# Patient Record
Sex: Female | Born: 1935 | Race: White | Hispanic: No | Smoking: Never smoker
Health system: Southern US, Community
[De-identification: ages and names within clinical notes are randomized; demographics above are authoritative.]

## PROBLEM LIST (undated history)

## (undated) DIAGNOSIS — M1990 Unspecified osteoarthritis, unspecified site: Secondary | ICD-10-CM

## (undated) DIAGNOSIS — T8859XA Other complications of anesthesia, initial encounter: Secondary | ICD-10-CM

## (undated) DIAGNOSIS — I872 Venous insufficiency (chronic) (peripheral): Secondary | ICD-10-CM

## (undated) DIAGNOSIS — N200 Calculus of kidney: Secondary | ICD-10-CM

## (undated) DIAGNOSIS — T7840XA Allergy, unspecified, initial encounter: Secondary | ICD-10-CM

## (undated) DIAGNOSIS — K219 Gastro-esophageal reflux disease without esophagitis: Secondary | ICD-10-CM

## (undated) DIAGNOSIS — J45909 Unspecified asthma, uncomplicated: Secondary | ICD-10-CM

## (undated) DIAGNOSIS — D649 Anemia, unspecified: Secondary | ICD-10-CM

## (undated) DIAGNOSIS — N3941 Urge incontinence: Secondary | ICD-10-CM

## (undated) DIAGNOSIS — K529 Noninfective gastroenteritis and colitis, unspecified: Secondary | ICD-10-CM

## (undated) DIAGNOSIS — Z8679 Personal history of other diseases of the circulatory system: Secondary | ICD-10-CM

## (undated) DIAGNOSIS — Z87442 Personal history of urinary calculi: Secondary | ICD-10-CM

## (undated) HISTORY — PX: APPENDECTOMY: SHX54

## (undated) HISTORY — DX: Allergy, unspecified, initial encounter: T78.40XA

## (undated) HISTORY — DX: Urge incontinence: N39.41

## (undated) HISTORY — DX: Personal history of other diseases of the circulatory system: Z86.79

## (undated) HISTORY — DX: Noninfective gastroenteritis and colitis, unspecified: K52.9

## (undated) HISTORY — DX: Unspecified asthma, uncomplicated: J45.909

## (undated) HISTORY — PX: COSMETIC SURGERY: SHX468

## (undated) HISTORY — DX: Gastro-esophageal reflux disease without esophagitis: K21.9

## (undated) HISTORY — DX: Anemia, unspecified: D64.9

## (undated) HISTORY — DX: Unspecified osteoarthritis, unspecified site: M19.90

---

## 1966-03-23 HISTORY — PX: TUBAL LIGATION: SHX77

## 1969-03-23 HISTORY — PX: AUGMENTATION MAMMAPLASTY: SUR837

## 1984-03-23 DIAGNOSIS — K529 Noninfective gastroenteritis and colitis, unspecified: Secondary | ICD-10-CM

## 1984-03-23 HISTORY — PX: CHOLECYSTECTOMY: SHX55

## 1984-03-23 HISTORY — DX: Noninfective gastroenteritis and colitis, unspecified: K52.9

## 1998-03-23 DIAGNOSIS — N200 Calculus of kidney: Secondary | ICD-10-CM

## 1998-03-23 HISTORY — DX: Calculus of kidney: N20.0

## 1998-08-06 ENCOUNTER — Emergency Department (HOSPITAL_COMMUNITY): Admission: EM | Admit: 1998-08-06 | Discharge: 1998-08-06 | Payer: Self-pay | Admitting: Emergency Medicine

## 1998-08-06 ENCOUNTER — Encounter: Payer: Self-pay | Admitting: Emergency Medicine

## 1998-11-15 ENCOUNTER — Emergency Department (HOSPITAL_COMMUNITY): Admission: EM | Admit: 1998-11-15 | Discharge: 1998-11-15 | Payer: Self-pay | Admitting: Emergency Medicine

## 1998-12-12 ENCOUNTER — Other Ambulatory Visit: Admission: RE | Admit: 1998-12-12 | Discharge: 1998-12-12 | Payer: Self-pay | Admitting: Obstetrics & Gynecology

## 1998-12-26 ENCOUNTER — Emergency Department (HOSPITAL_COMMUNITY): Admission: EM | Admit: 1998-12-26 | Discharge: 1998-12-27 | Payer: Self-pay | Admitting: Emergency Medicine

## 1998-12-31 ENCOUNTER — Ambulatory Visit (HOSPITAL_COMMUNITY): Admission: RE | Admit: 1998-12-31 | Discharge: 1998-12-31 | Payer: Self-pay | Admitting: Urology

## 1998-12-31 ENCOUNTER — Encounter: Payer: Self-pay | Admitting: Urology

## 1999-07-30 ENCOUNTER — Other Ambulatory Visit: Admission: RE | Admit: 1999-07-30 | Discharge: 1999-07-30 | Payer: Self-pay | Admitting: Obstetrics & Gynecology

## 2000-02-10 ENCOUNTER — Other Ambulatory Visit: Admission: RE | Admit: 2000-02-10 | Discharge: 2000-02-10 | Payer: Self-pay | Admitting: Obstetrics & Gynecology

## 2000-07-27 ENCOUNTER — Encounter: Payer: Self-pay | Admitting: Family Medicine

## 2000-07-27 ENCOUNTER — Encounter: Admission: RE | Admit: 2000-07-27 | Discharge: 2000-07-27 | Payer: Self-pay | Admitting: Family Medicine

## 2000-07-30 ENCOUNTER — Encounter: Payer: Self-pay | Admitting: Urology

## 2000-08-02 ENCOUNTER — Ambulatory Visit (HOSPITAL_COMMUNITY): Admission: RE | Admit: 2000-08-02 | Discharge: 2000-08-02 | Payer: Self-pay | Admitting: Urology

## 2001-02-11 ENCOUNTER — Other Ambulatory Visit: Admission: RE | Admit: 2001-02-11 | Discharge: 2001-02-11 | Payer: Self-pay | Admitting: Obstetrics & Gynecology

## 2001-02-22 ENCOUNTER — Encounter: Payer: Self-pay | Admitting: Urology

## 2001-02-22 ENCOUNTER — Encounter: Admission: RE | Admit: 2001-02-22 | Discharge: 2001-02-22 | Payer: Self-pay | Admitting: Urology

## 2003-03-24 HISTORY — PX: ABLATION: SHX5711

## 2003-11-21 ENCOUNTER — Encounter: Admission: RE | Admit: 2003-11-21 | Discharge: 2003-11-22 | Payer: Self-pay | Admitting: Family Medicine

## 2004-03-23 HISTORY — PX: LITHOTRIPSY: SUR834

## 2004-05-30 ENCOUNTER — Ambulatory Visit: Payer: Self-pay | Admitting: Internal Medicine

## 2004-12-25 ENCOUNTER — Ambulatory Visit (HOSPITAL_COMMUNITY): Admission: RE | Admit: 2004-12-25 | Discharge: 2004-12-25 | Payer: Self-pay | Admitting: Urology

## 2005-10-29 ENCOUNTER — Ambulatory Visit (HOSPITAL_COMMUNITY): Admission: RE | Admit: 2005-10-29 | Discharge: 2005-10-29 | Payer: Self-pay | Admitting: Urology

## 2007-03-05 ENCOUNTER — Emergency Department (HOSPITAL_COMMUNITY): Admission: EM | Admit: 2007-03-05 | Discharge: 2007-03-05 | Payer: Self-pay | Admitting: *Deleted

## 2007-10-17 ENCOUNTER — Telehealth: Payer: Self-pay | Admitting: Internal Medicine

## 2007-10-20 ENCOUNTER — Ambulatory Visit: Payer: Self-pay | Admitting: Internal Medicine

## 2007-10-20 DIAGNOSIS — Z87442 Personal history of urinary calculi: Secondary | ICD-10-CM

## 2007-10-20 DIAGNOSIS — R1319 Other dysphagia: Secondary | ICD-10-CM

## 2007-10-20 DIAGNOSIS — K589 Irritable bowel syndrome without diarrhea: Secondary | ICD-10-CM

## 2007-10-20 DIAGNOSIS — K580 Irritable bowel syndrome with diarrhea: Secondary | ICD-10-CM | POA: Insufficient documentation

## 2007-10-20 DIAGNOSIS — K429 Umbilical hernia without obstruction or gangrene: Secondary | ICD-10-CM | POA: Insufficient documentation

## 2007-10-20 DIAGNOSIS — M129 Arthropathy, unspecified: Secondary | ICD-10-CM | POA: Insufficient documentation

## 2007-10-20 DIAGNOSIS — N281 Cyst of kidney, acquired: Secondary | ICD-10-CM

## 2007-10-20 DIAGNOSIS — R1310 Dysphagia, unspecified: Secondary | ICD-10-CM | POA: Insufficient documentation

## 2007-10-20 DIAGNOSIS — R197 Diarrhea, unspecified: Secondary | ICD-10-CM

## 2007-10-20 DIAGNOSIS — Z862 Personal history of diseases of the blood and blood-forming organs and certain disorders involving the immune mechanism: Secondary | ICD-10-CM

## 2010-08-08 NOTE — Op Note (Signed)
[facility]  Patient:    Susan Boone, Susan Boone                    MRN: [id number] Proc. Date: 08/02/00 Adm. Date:  [id number] Attending:  Laqueta Jean                           Operative Report  PREOPERATIVE DIAGNOSIS.  Left lower ureteral stone with flank pain and hematuria.  POSTOPERATIVE DIAGNOSIS:  Left lower ureteral stone with flank pain and hematuria.  OPERATION:  Cystourethroscopy, bilateral retrograde pyelogram, left ureteral ureteroscopy, holmium laser fractionization of ureteral calculus, basket extraction of ureteral stone, and left double-J catheter.  SURGEON:  Sigmund I. Patsi Sears, M.D.  ANESTHESIA:  General (LMA).  PREPARATION:  After appropriate preanesthesia, the patient was brought to the operating room, placed on the operating table in the dorsal supine position where general LMA anesthesia was introduced.  She was then re-placed in the low Allen stirrups, dorsal lithotomy position, where the pubis was prepped with Betadine solution and draped in the usual fashion.  REVIEW OF HISTORY:  This 76 year old female has a history of microhematuria, flank pain, with CT scan showing a lower third ureteral stone, measuring 1 cm. Because of the patients increasing pain, she was covered with oral pain medication until surgical intervention could be arranged for nonprogression of her stone.  DESCRIPTION OF PROCEDURE:  Retrograde pyelogram revealed the stone in the lower third ureter and on x-ray, the stone was partially hidden by the pelvis.  Retrograde pyelogram identified the stone, and ureteroscopy was accomplished after a Glidewire was passed around the stone into the renal pelvis.  Through the ureteroscope, it was obvious that the stone was too large to bring through the ureter.  The stone apparently had grown into the ureter, at the lateral posterior portion and could not be moved.  There was scar or  stricture circumferentially distal to the stone.  Using the holmium laser, the stone was fractured into multiple small portions, and these portions were basket-extracted.  Following this, repeat ureteroscopy and retrograde pyelograms performed which showed no evidence of extravasation. A definite area at the base of the stone could be identified, which had grown into the wall.  Because of a large amount of manipulation within the ureter, it was elected to leave a double-J catheter, and a 6 mm x 24 cm double-J catheter was selected and placed.  Repeat retrograde pyelogram was performed around the double-J catheter to be sure that it was within the renal pelvis, which was intrarenal and very narrow.  The patient tolerated the procedure well and was given a B&O suppository, IV Toradol, and antiemetic (Zofran), awakened, and taken to the recovery room in good condition. DD:  08/02/00 TD:  08/02/00 Job: [id number]

## 2010-12-29 LAB — BASIC METABOLIC PANEL
Chloride: 106
Potassium: 4

## 2010-12-29 LAB — URINALYSIS, ROUTINE W REFLEX MICROSCOPIC
Bilirubin Urine: NEGATIVE
Ketones, ur: NEGATIVE
Nitrite: NEGATIVE
Protein, ur: 100 — AB
Urobilinogen, UA: 0.2
pH: 5

## 2010-12-29 LAB — URINE MICROSCOPIC-ADD ON

## 2010-12-29 LAB — DIFFERENTIAL
Basophils Absolute: 0
Eosinophils Absolute: 0.1 — ABNORMAL LOW
Lymphocytes Relative: 17
Monocytes Absolute: 0.3
Neutro Abs: 6.6
Neutrophils Relative %: 78 — ABNORMAL HIGH

## 2010-12-29 LAB — CBC
HCT: 42.2
MCV: 84.7
RBC: 4.99
RDW: 14.5
WBC: 8.4

## 2011-02-20 ENCOUNTER — Other Ambulatory Visit: Payer: Self-pay | Admitting: Internal Medicine

## 2011-02-20 DIAGNOSIS — N200 Calculus of kidney: Secondary | ICD-10-CM

## 2011-02-23 ENCOUNTER — Other Ambulatory Visit: Payer: Self-pay

## 2011-02-25 ENCOUNTER — Ambulatory Visit
Admission: RE | Admit: 2011-02-25 | Discharge: 2011-02-25 | Disposition: A | Discharge status: Home / Self Care | Payer: Medicare Other | Source: Ambulatory Visit | Attending: Internal Medicine | Admitting: Internal Medicine

## 2011-02-25 DIAGNOSIS — N200 Calculus of kidney: Secondary | ICD-10-CM

## 2011-05-02 ENCOUNTER — Encounter (HOSPITAL_COMMUNITY): Payer: Self-pay | Admitting: *Deleted

## 2011-05-02 ENCOUNTER — Emergency Department (HOSPITAL_COMMUNITY)
Admission: EM | Admit: 2011-05-02 | Discharge: 2011-05-02 | Disposition: A | Discharge status: Home / Self Care | Payer: Medicare Other | Attending: Emergency Medicine | Admitting: Emergency Medicine

## 2011-05-02 ENCOUNTER — Emergency Department (HOSPITAL_COMMUNITY): Payer: Medicare Other

## 2011-05-02 DIAGNOSIS — R109 Unspecified abdominal pain: Secondary | ICD-10-CM | POA: Insufficient documentation

## 2011-05-02 DIAGNOSIS — R300 Dysuria: Secondary | ICD-10-CM | POA: Insufficient documentation

## 2011-05-02 DIAGNOSIS — N200 Calculus of kidney: Secondary | ICD-10-CM | POA: Insufficient documentation

## 2011-05-02 DIAGNOSIS — R112 Nausea with vomiting, unspecified: Secondary | ICD-10-CM | POA: Insufficient documentation

## 2011-05-02 HISTORY — DX: Calculus of kidney: N20.0

## 2011-05-02 LAB — URINALYSIS, ROUTINE W REFLEX MICROSCOPIC
Glucose, UA: NEGATIVE mg/dL
Nitrite: NEGATIVE
Protein, ur: 100 mg/dL — AB
Urobilinogen, UA: 0.2 mg/dL (ref 0.0–1.0)

## 2011-05-02 LAB — POCT I-STAT, CHEM 8
BUN: 18 mg/dL (ref 6–23)
Calcium, Ion: 1.17 mmol/L (ref 1.12–1.32)
Chloride: 107 mEq/L (ref 96–112)
Glucose, Bld: 148 mg/dL — ABNORMAL HIGH (ref 70–99)
Hemoglobin: 16.3 g/dL — ABNORMAL HIGH (ref 12.0–15.0)
TCO2: 26 mmol/L (ref 0–100)

## 2011-05-02 LAB — URINE MICROSCOPIC-ADD ON

## 2011-05-02 MED ORDER — PANTOPRAZOLE SODIUM 40 MG IV SOLR
40.0000 mg | Freq: Once | INTRAVENOUS | Status: AC
  Administered 2011-05-02: 40 mg via INTRAVENOUS
  Filled 2011-05-02: qty 40

## 2011-05-02 MED ORDER — ONDANSETRON HCL 4 MG/2ML IJ SOLN
4.0000 mg | Freq: Once | INTRAMUSCULAR | Status: AC
  Administered 2011-05-02: 4 mg via INTRAVENOUS
  Filled 2011-05-02: qty 2

## 2011-05-02 MED ORDER — CEPHALEXIN 500 MG PO CAPS: 500.0000 mg | ORAL_CAPSULE | Freq: Four times a day (QID) | ORAL | Status: AC

## 2011-05-02 MED ORDER — ONDANSETRON HCL 4 MG/2ML IJ SOLN
INTRAMUSCULAR | Status: AC
  Administered 2011-05-02: 4 mg via INTRAVENOUS
  Filled 2011-05-02: qty 2

## 2011-05-02 MED ORDER — SODIUM CHLORIDE 0.9 % IV BOLUS (SEPSIS)
500.0000 mL | Freq: Once | INTRAVENOUS | Status: AC
  Administered 2011-05-02: 500 mL via INTRAVENOUS

## 2011-05-02 MED ORDER — MORPHINE SULFATE 4 MG/ML IJ SOLN
4.0000 mg | Freq: Once | INTRAMUSCULAR | Status: AC
  Administered 2011-05-02: 4 mg via INTRAVENOUS
  Filled 2011-05-02: qty 1

## 2011-05-02 MED ORDER — OXYCODONE-ACETAMINOPHEN 5-325 MG PO TABS: 1.0000 | ORAL_TABLET | Freq: Four times a day (QID) | ORAL | Status: AC | PRN

## 2011-05-02 MED ORDER — GI COCKTAIL ~~LOC~~
30.0000 mL | Freq: Once | ORAL | Status: AC
  Administered 2011-05-02: 30 mL via ORAL
  Filled 2011-05-02: qty 30

## 2011-05-02 NOTE — ED Notes (Signed)
Patient is alert and oriented x3.  She was given DC instructions and follow up visit instructions.  Patient gave verbal understanding. She was DC ambulatory under his own power to home.  V/S stable.  He was not showing any signs of distress on DC 

## 2011-05-02 NOTE — ED Provider Notes (Signed)
Medical screening examination/treatment/procedure(s) were conducted as a shared visit with non-physician practitioner(s) and myself.  I personally evaluated the patient during the encounter  Patient's pain is controlled at this time. Symptoms are similar to her prior renal colic will be given prescription for pain medication and will be covered for possible early infection and hernia  Toy Baker, MD 05/02/11 [phone]

## 2011-05-02 NOTE — ED Provider Notes (Signed)
History     CSN: [id number]  Arrival date & time 05/02/11  0308   First MD Initiated Contact with Patient 05/02/11 0400      Chief Complaint  Patient presents with  . Flank Pain     HPI  History provided by the patient and spouse. Patient 77 year old female past history of multiple kidney stones [place] presents with complaints of right flank pain, hematuria and episodes of nausea and vomiting similar to prior kidney stone symptoms. Symptoms came on gradually around 4 PM yesterday afternoon. They have progressively gotten worse. Symptoms are moderate to severe. Pain is located in the right flank and does not radiate. Patient has not taken anything for the symptoms. She does not report any aggravating or alleviating factors. She denies any fever, chills, sweats, diarrhea or constipation.    Past Medical History  Diagnosis Date  . Kidney stones   . Migraine     History reviewed. No pertinent past surgical history.  History reviewed. No pertinent family history.  History  Substance Use Topics  . Smoking status: Not on file  . Smokeless tobacco: Not on file  . Alcohol Use:     OB History    Grav Para Term Preterm Abortions TAB SAB Ect Mult Living                  Review of Systems  Constitutional: Negative for fever and chills.  Cardiovascular: Negative for chest pain.  Gastrointestinal: Positive for nausea and vomiting. Negative for abdominal pain, diarrhea and constipation.  Genitourinary: Positive for dysuria, hematuria and flank pain. Negative for frequency.    Allergies  Codeine and Sulfonamide derivatives  Home Medications   Current Outpatient Rx  Name Route Sig Dispense Refill  . NADOLOL 40 MG PO TABS Oral Take 40 mg by mouth daily.      BP 153/80  Pulse 59  Temp(Src) 98.9 F (37.2 C) (Oral)  Resp 18  SpO2 95%  Physical Exam  Nursing note and vitals reviewed. Constitutional: She is oriented to person, place, and  time. She appears well-developed and well-nourished. No distress.  HENT:  Head: Normocephalic and atraumatic.  Cardiovascular: Normal rate and regular rhythm.   Pulmonary/Chest: Effort normal and breath sounds normal. No respiratory distress. She has no wheezes. She has no rales.  Abdominal: Soft. She exhibits no distension. There is no tenderness. There is CVA tenderness. There is no rebound, no guarding and no tenderness at McBurney's point.       Right CVA tenderness  Neurological: She is alert and oriented to person, place, and time.  Skin: Skin is warm and dry. No rash noted.  Psychiatric: She has a normal mood and affect. Her behavior is normal.    ED Course  Procedures   Labs Reviewed  URINALYSIS, ROUTINE W REFLEX MICROSCOPIC   Results for orders placed during the hospital encounter of 05/02/11  URINALYSIS, ROUTINE W REFLEX MICROSCOPIC      Component Value Range   Color, Urine RED (*) YELLOW    APPearance TURBID (*) CLEAR    Specific Gravity, Urine 1.020  1.005 - 1.030    pH 5.5  5.0 - 8.0    Glucose, UA NEGATIVE  NEGATIVE (mg/dL)   Hgb urine dipstick LARGE (*) NEGATIVE    Bilirubin Urine NEGATIVE  NEGATIVE    Ketones, ur TRACE (*) NEGATIVE (mg/dL)   Protein, ur 409 (*) NEGATIVE (mg/dL)   Urobilinogen, UA 0.2  0.0 - 1.0 (  mg/dL)   Nitrite NEGATIVE  NEGATIVE    Leukocytes, UA MODERATE (*) NEGATIVE   URINE MICROSCOPIC-ADD ON      Component Value Range   Squamous Epithelial / LPF FEW (*) RARE    WBC, UA 7-10  <3 (WBC/hpf)   RBC / HPF TOO NUMEROUS TO COUNT  <3 (RBC/hpf)   Bacteria, UA FEW (*) RARE   POCT I-STAT, CHEM 8      Component Value Range   Sodium 142  135 - 145 (mEq/L)   Potassium 5.0  3.5 - 5.1 (mEq/L)   Chloride 107  96 - 112 (mEq/L)   BUN 18  6 - 23 (mg/dL)   Creatinine, Ser 9.60  0.50 - 1.10 (mg/dL)   Glucose, Bld 454 (*) 70 - 99 (mg/dL)   Calcium, Ion 0.98  1.19 - 1.32 (mmol/L)   TCO2 26  0 - 100 (mmol/L)   Hemoglobin 16.3 (*) 12.0 - 15.0 (g/dL)   HCT  14.7 (*) 82.9 - 46.0 (%)      Dg Abd 1 View  05/02/2011  *RADIOLOGY REPORT*  Clinical Data: Nausea and abdominal pain.  ABDOMEN - 1 VIEW  Comparison: CT abdomen and pelvis 02/25/2011  Findings: The large staghorn type calculus in the left kidney. Smaller stones projected over the lower pole of the right kidney. Calcifications in the right quadrant have been present previously and likely represent vascular calcifications.  No definite ureteral stones demonstrated.  Calcifications in the pelvis consistent with phleboliths.  Normal bowel gas pattern.  Stool in the right colon. No small bowel distension.  Surgical clips in the right upper quadrant.  Degenerative changes in the spine.  IMPRESSION: Bilateral renal stones which appear stable since the previous CT scan, allowing for differences in technique.  No definite evidence of a ureteral stone.  Original Report Authenticated By: Marlon Pel, M.D.     1. Kidney stone       MDM  4:00AM patient seen and evaluated. Patient no acute distress.   Patient seen and discussed with attending physician. He agrees with workup and treatment plan. Plan to provide pain medications for kidney stone as well as antibiotic for possible early infection. Patient will followup with her urology specialist in [place].       Angus Seller, [place] 05/02/11 [phone]

## 2011-05-02 NOTE — ED Notes (Signed)
EMS called to home.  Found patient sitting in chair.  Complaining in right flank  Patient states that she has history of kidney stone Patient confirms nausea and vomiting with headache fentanyl. 4mg  zofran by EMS 18g left AC.  95% ORA  BP 158/76   HR 60

## 2011-05-03 LAB — URINE CULTURE: Culture  Setup Time: 201302091127

## 2011-05-03 NOTE — ED Provider Notes (Signed)
Medical screening examination/treatment/procedure(s) were performed by non-physician practitioner and as supervising physician I was immediately available for consultation/collaboration.  Shakiyla Kook T Jaxon Flatt, MD 05/03/11 0236 

## 2011-06-23 DIAGNOSIS — N200 Calculus of kidney: Secondary | ICD-10-CM | POA: Insufficient documentation

## 2011-06-23 DIAGNOSIS — G43909 Migraine, unspecified, not intractable, without status migrainosus: Secondary | ICD-10-CM | POA: Insufficient documentation

## 2011-06-23 DIAGNOSIS — Z9109 Other allergy status, other than to drugs and biological substances: Secondary | ICD-10-CM | POA: Insufficient documentation

## 2011-06-23 DIAGNOSIS — R600 Localized edema: Secondary | ICD-10-CM | POA: Insufficient documentation

## 2011-06-30 HISTORY — PX: PERCUTANEOUS NEPHROSTOLITHOTOMY: SHX2207

## 2011-07-07 HISTORY — PX: URETEROSCOPY WITH HOLMIUM LASER LITHOTRIPSY: SHX6645

## 2011-07-07 HISTORY — PX: PERCUTANEOUS NEPHROSTOLITHOTOMY: SHX2207

## 2011-08-13 ENCOUNTER — Encounter: Payer: Self-pay | Admitting: *Deleted

## 2011-12-17 HISTORY — PX: URETEROLITHOTOMY: SHX71

## 2012-01-27 ENCOUNTER — Encounter: Payer: Self-pay | Admitting: Cardiovascular Disease

## 2012-07-30 ENCOUNTER — Ambulatory Visit (INDEPENDENT_AMBULATORY_CARE_PROVIDER_SITE_OTHER): Payer: Medicare Other | Admitting: Family Medicine

## 2012-07-30 VITALS — BP 139/83 | HR 60 | Temp 98.2°F | Resp 18 | Wt 216.0 lb

## 2012-07-30 DIAGNOSIS — R300 Dysuria: Secondary | ICD-10-CM

## 2012-07-30 DIAGNOSIS — N390 Urinary tract infection, site not specified: Secondary | ICD-10-CM

## 2012-07-30 LAB — POCT URINALYSIS DIPSTICK
Bilirubin, UA: NEGATIVE
Glucose, UA: NEGATIVE
Nitrite, UA: NEGATIVE
Protein, UA: NEGATIVE
Spec Grav, UA: 1.02
Urobilinogen, UA: 0.2

## 2012-07-30 LAB — POCT UA - MICROSCOPIC ONLY
Crystals, Ur, HPF, POC: NEGATIVE
Mucus, UA: NEGATIVE

## 2012-07-30 MED ORDER — CIPROFLOXACIN HCL 500 MG PO TABS: 500.0000 mg | ORAL_TABLET | Freq: Two times a day (BID) | ORAL | Status: DC

## 2012-07-30 NOTE — Progress Notes (Signed)
  Subjective:    Patient ID: Susan Boone, female    DOB: 02/20/1935, 78 y.o.   MRN: [id number] Chief Complaint  Patient presents with  . Urinary Tract Infection    HPI  Was completely blocked w/ a staghorn stone in left kidney req 2 surgeries and then had to have ureteral stend placed in right kidney for a stone but since then she has not had good urine control so has to wear pads.  Has had some cipro at home for 5d she took 2 wks ago. Her urologist is Dr. Ottie Glazier at [facility]. Drinking tons of water. Has minor sxs but didn't want it to get bad - just feels like she is having bladder irritation - no way to tell about frequency. Does look cloudy to her which is new, a little pain.  No kidney pain - just chronic back pain. No f/c, no n/v, is having some general abd pains.  Always works very well on cipro    Review of Systems    BP 139/83  Pulse 60  Temp(Src) 98.2 F (36.8 C) (Oral)  Resp 18  Wt 216 lb (97.977 kg)  BMI 38.27 kg/m2 Objective:   Physical Exam        Results for orders placed in visit on 07/30/12  POCT UA - MICROSCOPIC ONLY      Result Value Range   WBC, Ur, HPF, POC tntc     RBC, urine, microscopic 1-2     Bacteria, U Microscopic trace     Mucus, UA neg     Epithelial cells, urine per micros 0-1     Crystals, Ur, HPF, POC neg     Casts, Ur, LPF, POC renal tubular     Yeast, UA neg    POCT URINALYSIS DIPSTICK      Result Value Range   Color, UA yellow     Clarity, UA cloudy     Glucose, UA neg     Bilirubin, UA neg     Ketones, UA neg     Spec Grav, UA 1.020     Blood, UA small     pH, UA 5.5     Protein, UA neg     Urobilinogen, UA 0.2     Nitrite, UA neg     Leukocytes, UA large (3+)      Assessment & Plan:

## 2012-08-02 LAB — URINE CULTURE: Colony Count: 75000

## 2012-09-15 ENCOUNTER — Other Ambulatory Visit: Payer: Self-pay | Admitting: Nurse Practitioner

## 2012-09-15 DIAGNOSIS — Z1231 Encounter for screening mammogram for malignant neoplasm of breast: Secondary | ICD-10-CM

## 2012-09-15 DIAGNOSIS — E2839 Other primary ovarian failure: Secondary | ICD-10-CM

## 2012-10-06 ENCOUNTER — Ambulatory Visit
Admission: RE | Admit: 2012-10-06 | Discharge: 2012-10-06 | Disposition: A | Discharge status: Home / Self Care | Payer: Medicare Other | Source: Ambulatory Visit | Attending: Nurse Practitioner | Admitting: Nurse Practitioner

## 2012-10-06 DIAGNOSIS — Z1231 Encounter for screening mammogram for malignant neoplasm of breast: Secondary | ICD-10-CM

## 2012-10-06 DIAGNOSIS — E2839 Other primary ovarian failure: Secondary | ICD-10-CM

## 2012-10-10 ENCOUNTER — Other Ambulatory Visit: Payer: Self-pay | Admitting: Nurse Practitioner

## 2013-01-13 ENCOUNTER — Ambulatory Visit (INDEPENDENT_AMBULATORY_CARE_PROVIDER_SITE_OTHER): Payer: Medicare Other | Admitting: Emergency Medicine

## 2013-01-13 VITALS — BP 130/60 | HR 61 | Temp 99.0°F | Resp 16 | Ht 63.0 in | Wt 216.0 lb

## 2013-01-13 DIAGNOSIS — R300 Dysuria: Secondary | ICD-10-CM

## 2013-01-13 DIAGNOSIS — N390 Urinary tract infection, site not specified: Secondary | ICD-10-CM

## 2013-01-13 DIAGNOSIS — N23 Unspecified renal colic: Secondary | ICD-10-CM

## 2013-01-13 LAB — POCT URINALYSIS DIPSTICK
Bilirubin, UA: NEGATIVE
Glucose, UA: NEGATIVE
Ketones, UA: NEGATIVE
Nitrite, UA: POSITIVE
Protein, UA: 30
Spec Grav, UA: 1.02
Urobilinogen, UA: 0.2
pH, UA: 6

## 2013-01-13 LAB — POCT UA - MICROSCOPIC ONLY
Crystals, Ur, HPF, POC: NEGATIVE
Mucus, UA: NEGATIVE
RBC, urine, microscopic: NEGATIVE
Yeast, UA: NEGATIVE

## 2013-01-13 MED ORDER — CIPROFLOXACIN HCL 250 MG PO TABS: 250.0000 mg | ORAL_TABLET | Freq: Two times a day (BID) | ORAL | Status: DC

## 2013-01-13 NOTE — Progress Notes (Signed)
Subjective:    Patient ID: Susan Boone, female    DOB: Mar 13, 1935, 78 y.o.   MRN: [id number]  This chart was scribed for Lesle Chris, MD by Blanchard Kelch, ED Scribe. The patient was seen in room 9. Patient's care was started at 4:48 PM.   HPI  Susan Boone is a 78 y.o. female who presents to office complaining of dysuria that began about three days ago. She had chills and fever one day with the dysuria. She has not had chills today. She has a past medical history of kidney stones. She had three surgeries in [facility] for them in 2013. She has had many UTIs in the past. She is usually treated with Cipro for the infections and states that it always treats the infection.    Past Medical History  Diagnosis Date  . Migraine   . H/O orthostatic hypotension   . Esophageal reflux   . Allergy   . Arthritis   . Kidney stones    Past Surgical History  Procedure Laterality Date  . Tubal ligation    . Appendectomy    . Lithotripsy    . Cholecystectomy     Family History  Problem Relation Age of Onset  . Stroke Father   . Hyperlipidemia Father   . Lung cancer Mother    History   Social History  . Marital Status: Divorced    Spouse Name: N/A    Number of Children: N/A  . Years of Education: N/A   Occupational History  . Not on file.   Social History Main Topics  . Smoking status: Never Smoker   . Smokeless tobacco: Not on file  . Alcohol Use: No  . Drug Use: No  . Sexual Activity: Not on file   Other Topics Concern  . Not on file   Social History Narrative  . No narrative on file   Allergies  Allergen Reactions  . Codeine   . Doxycycline   . Sulfonamide Derivatives    Current Outpatient Prescriptions on File Prior to Visit  Medication Sig Dispense Refill  . hyoscyamine (HYOMAX) 0.125 MG tablet Take 0.125 mg by mouth every 4 (four) hours as needed.      . nadolol (CORGARD) 80 MG tablet Take 80 mg by mouth daily.      . ciprofloxacin (CIPRO) 500 MG tablet  Take 1 tablet (500 mg total) by mouth 2 (two) times daily.  20 tablet  0  . loratadine (CLARITIN) 10 MG tablet Take 10 mg by mouth daily.      . naproxen sodium (ANAPROX) 220 MG tablet Take 220 mg by mouth 2 (two) times daily with a meal.      . ranitidine (ZANTAC) 150 MG capsule Take 150 mg by mouth 2 (two) times daily.       No current facility-administered medications on file prior to visit.     Review of Systems  Constitutional: Positive for fever and chills.  Genitourinary: Positive for dysuria.       Objective:   Physical Exam  Abdominal: Soft. There is no tenderness.  No flank pain. Midline scar.  Musculoskeletal: Normal range of motion.     Results for orders placed in visit on 01/13/13  POCT URINALYSIS DIPSTICK      Result Value Range   Color, UA yellow     Clarity, UA cloudy     Glucose, UA neg     Bilirubin, UA neg     Ketones, UA  neg     Spec Grav, UA 1.020     Blood, UA moderate     pH, UA 6.0     Protein, UA 30     Urobilinogen, UA 0.2     Nitrite, UA positive     Leukocytes, UA large (3+)    POCT UA - MICROSCOPIC ONLY      Result Value Range   WBC, Ur, HPF, POC tntc     RBC, urine, microscopic neg     Bacteria, U Microscopic 2+     Mucus, UA neg     Epithelial cells, urine per micros 0-3     Crystals, Ur, HPF, POC neg     Casts, Ur, LPF, POC renal tubular     Yeast, UA neg         Assessment & Plan:  Urine culture done will treat with Cipro 250 twice a day   I personally performed the services described in this documentation, which was scribed in my presence. The recorded information has been reviewed and is accurate.

## 2013-01-16 LAB — URINE CULTURE: Colony Count: >=100000

## 2013-01-31 ENCOUNTER — Ambulatory Visit (INDEPENDENT_AMBULATORY_CARE_PROVIDER_SITE_OTHER): Payer: Medicare Other | Admitting: Family Medicine

## 2013-01-31 VITALS — BP 118/62 | HR 65 | Temp 98.6°F | Resp 18 | Ht 63.0 in | Wt 217.0 lb

## 2013-01-31 DIAGNOSIS — Z87442 Personal history of urinary calculi: Secondary | ICD-10-CM

## 2013-01-31 DIAGNOSIS — R300 Dysuria: Secondary | ICD-10-CM

## 2013-01-31 DIAGNOSIS — R5381 Other malaise: Secondary | ICD-10-CM

## 2013-01-31 LAB — POCT URINALYSIS DIPSTICK
Bilirubin, UA: NEGATIVE
Glucose, UA: NEGATIVE
Ketones, UA: NEGATIVE
Protein, UA: 30
Spec Grav, UA: 1.01

## 2013-01-31 LAB — POCT UA - MICROSCOPIC ONLY

## 2013-01-31 MED ORDER — LEVOFLOXACIN 500 MG PO TABS: 500.0000 mg | ORAL_TABLET | Freq: Every day | ORAL | Status: DC

## 2013-01-31 NOTE — Patient Instructions (Signed)
Klebsiella was the name of the bacteria infecting the urine at your visit on 01/13/2013  Results for orders placed in visit on 01/31/13  POCT UA - MICROSCOPIC ONLY      Result Value Range   WBC, Ur, HPF, POC tntc     RBC, urine, microscopic 0-2     Bacteria, U Microscopic trace     Mucus, UA neg     Epithelial cells, urine per micros 1-3     Crystals, Ur, HPF, POC neg     Casts, Ur, LPF, POC renal tubular     Yeast, UA neg    POCT URINALYSIS DIPSTICK      Result Value Range   Color, UA yellow     Clarity, UA cloudy     Glucose, UA neg     Bilirubin, UA neg     Ketones, UA neg     Spec Grav, UA 1.010     Blood, UA moderate     pH, UA 5.5     Protein, UA 30     Urobilinogen, UA 0.2     Nitrite, UA neg     Leukocytes, UA large (3+)

## 2013-01-31 NOTE — Progress Notes (Signed)
  Subjective:    Patient ID: Susan Boone, female    DOB: 09-May-1934, 78 y.o.   MRN: [id number]  This chart was scribed for Elvina Sidle, MD by Blanchard Kelch, ED Scribe. The patient was seen in room 9. Patient's care was started at 4:12 PM.   HPI   Susan Boone is a 78 y.o. female who presents to office complaining of dysuria that began last night. She was seen here by Dr. Cleta Alberts on 10/24 for the same symptoms and was prescribed Cipro for ten days. She states yesterday she wasn't feeling well and then the burning, stinging dysuria began last night. She had three kidney surgeries at [facility] in 2013 to remove kidney stones. She states that she has had recurrent kidney stones since she was 42 and gets recurrent UTIs as well.   Review of Systems  Constitutional: Negative for fever and chills.  HENT: Negative for drooling.   Eyes: Negative for discharge.  Respiratory: Negative for cough.   Cardiovascular: Negative for leg swelling.  Gastrointestinal: Negative for vomiting.  Endocrine: Negative for polyuria.  Genitourinary: Positive for dysuria. Negative for hematuria.  Musculoskeletal: Negative for gait problem.  Skin: Negative for rash.  Allergic/Immunologic: Negative for immunocompromised state.  Neurological: Negative for speech difficulty.  Hematological: Negative for adenopathy.  Psychiatric/Behavioral: Negative for confusion.       Objective:   Physical Exam  Nursing note and vitals reviewed. Constitutional: She is oriented to person, place, and time. She appears well-developed and well-nourished. No distress.  HENT:  Head: Normocephalic and atraumatic.  Eyes: EOM are normal.  Neck: Neck supple. No tracheal deviation present.  Cardiovascular: Normal rate.   Pulmonary/Chest: Effort normal. No respiratory distress.  Musculoskeletal: Normal range of motion.  Neurological: She is alert and oriented to person, place, and time.  Skin: Skin is warm and dry.  Psychiatric:  She has a normal mood and affect. Her behavior is normal.   Abdomen soft and nontender Flank: Nontender       Assessment & Plan:  Dysuria - Plan: POCT UA - Microscopic Only, POCT urinalysis dipstick, Urine culture, levofloxacin (LEVAQUIN) 500 MG tablet, DISCONTINUED: levofloxacin (LEVAQUIN) 500 MG tablet  Malaise  History of kidney stones  Signed, Elvina Sidle, MD

## 2013-02-02 LAB — URINE CULTURE: Culture: 75000

## 2013-06-04 ENCOUNTER — Ambulatory Visit (INDEPENDENT_AMBULATORY_CARE_PROVIDER_SITE_OTHER): Payer: Medicare Other | Admitting: Emergency Medicine

## 2013-06-04 VITALS — BP 120/60 | HR 63 | Temp 98.6°F | Resp 16 | Ht 62.0 in | Wt 216.0 lb

## 2013-06-04 DIAGNOSIS — N3000 Acute cystitis without hematuria: Secondary | ICD-10-CM

## 2013-06-04 DIAGNOSIS — R300 Dysuria: Secondary | ICD-10-CM

## 2013-06-04 LAB — POCT URINALYSIS DIPSTICK
BILIRUBIN UA: NEGATIVE
Glucose, UA: NEGATIVE
KETONES UA: NEGATIVE
Nitrite, UA: NEGATIVE
PH UA: 5.5
Protein, UA: 100
SPEC GRAV UA: 1.025
Urobilinogen, UA: 0.2

## 2013-06-04 LAB — POCT UA - MICROSCOPIC ONLY
Casts, Ur, LPF, POC: NEGATIVE
Crystals, Ur, HPF, POC: NEGATIVE
Mucus, UA: NEGATIVE
Yeast, UA: NEGATIVE

## 2013-06-04 MED ORDER — PHENAZOPYRIDINE HCL 200 MG PO TABS: 200.0000 mg | ORAL_TABLET | Freq: Three times a day (TID) | ORAL | Status: DC | PRN

## 2013-06-04 MED ORDER — CIPROFLOXACIN HCL 500 MG PO TABS: 500.0000 mg | ORAL_TABLET | Freq: Two times a day (BID) | ORAL | Status: DC

## 2013-06-04 NOTE — Addendum Note (Signed)
Addended by: Carmelina DaneANDERSON, Gionni Freese S on: 06/04/2013 04:52 PM   Modules accepted: Orders

## 2013-06-04 NOTE — Progress Notes (Signed)
[facility] [address], [place] [address] [phone]  Date:  06/04/2013   Name:  Susan ShearsMargaret B Boone   DOB:  [birth year]   MRN:  [id number]  PCP:  No PCP Per Patient    Chief Complaint: Dysuria   History of Present Illness:  Susan Boone is a 79 y.o. very pleasant female patient who presents with the following:  Has history of recurrent UTI's and has pain when urinating associated with frequency and urgency since Friday.  No fever or chills. No nausea or vomiting.  No stool change, GYN symptoms.  No improvement with over the counter medications or other home remedies. Denies other complaint or health concern today.       Patient Active Problem List   Diagnosis Date Noted  . UMBILICAL HERNIA 10/20/2007  . IRRITABLE BOWEL SYNDROME 10/20/2007  . RENAL CYST, LEFT 10/20/2007  . ARTHRITIS 10/20/2007  . DYSPHAGIA UNSPECIFIED 10/20/2007  . DYSPHAGIA 10/20/2007  . DIARRHEA 10/20/2007  . ANEMIA, HX OF 10/20/2007  . NEPHROLITHIASIS, HX OF 10/20/2007    Past Medical History  Diagnosis Date  . Migraine   . H/O orthostatic hypotension   . Esophageal reflux   . Allergy   . Arthritis   . Kidney stones     Past Surgical History  Procedure Laterality Date  . Tubal ligation    . Appendectomy    . Lithotripsy    . Cholecystectomy      History  Substance Use Topics  . Smoking status: Never Smoker   . Smokeless tobacco: Not on file  . Alcohol Use: No    Family History  Problem Relation Age of Onset  . Stroke Father   . Hyperlipidemia Father   . Lung cancer Mother     Allergies  Allergen Reactions  . Codeine   . Doxycycline   . Sulfonamide Derivatives     Medication list has been reviewed and updated.  Current Outpatient Prescriptions on File Prior to Visit  Medication Sig Dispense Refill  . nadolol (CORGARD) 80 MG tablet Take 80 mg by mouth daily.      . ciprofloxacin (CIPRO) 250 MG tablet Take 1 tablet (250 mg total) by mouth 2  (two) times daily.  20 tablet  0  . hyoscyamine (HYOMAX) 0.125 MG tablet Take 0.125 mg by mouth every 4 (four) hours as needed.      Marland Kitchen. levofloxacin (LEVAQUIN) 500 MG tablet Take 1 tablet (500 mg total) by mouth daily.  7 tablet  0  . loratadine (CLARITIN) 10 MG tablet Take 10 mg by mouth daily.      . naproxen sodium (ANAPROX) 220 MG tablet Take 220 mg by mouth 2 (two) times daily with a meal.      . potassium citrate (UROCIT-K) 10 MEQ (1080 MG) SR tablet Take 10 mEq by mouth 3 (three) times daily with meals.      . ranitidine (ZANTAC) 150 MG capsule Take 150 mg by mouth 2 (two) times daily.       No current facility-administered medications on file prior to visit.    Review of Systems:  As per HPI, otherwise negative.    Physical Examination: Filed Vitals:   06/04/13 1617  BP: 120/60  Pulse: 63  Temp: 98.6 F (37 C)  Resp: 16   Filed Vitals:   06/04/13 1617  Height: 5\' 2"  (1.575 m)  Weight: 216 lb (97.977 kg)   Body mass index is 39.5 kg/(m^2). Ideal  Body Weight: Weight in (lb) to have BMI = 25: 136.4  GEN: WDWN, NAD, Non-toxic, A & O x 3 HEENT: Atraumatic, Normocephalic. Neck supple. No masses, No LAD. Ears and Nose: No external deformity. CV: RRR, No M/G/R. No JVD. No thrill. No extra heart sounds. PULM: CTA B, no wheezes, crackles, rhonchi. No retractions. No resp. distress. No accessory muscle use. ABD: S, NT, ND, +BS. No rebound. No HSM. EXTR: No c/c/e NEURO Normal gait.  PSYCH: Normally interactive. Conversant. Not depressed or anxious appearing.  Calm demeanor.    Assessment and Plan: Cystitis Septra Pyridium  Signed,  Phillips Odor, MD   Results for orders placed in visit on 06/04/13  POCT URINALYSIS DIPSTICK      Result Value Ref Range   Color, UA yellow     Clarity, UA cloudy     Glucose, UA neg     Bilirubin, UA neg     Ketones, UA neg     Spec Grav, UA 1.025     Blood, UA moderate     pH, UA 5.5     Protein, UA 100     Urobilinogen, UA  0.2     Nitrite, UA neg     Leukocytes, UA large (3+)    POCT UA - MICROSCOPIC ONLY      Result Value Ref Range   WBC, Ur, HPF, POC tntc     RBC, urine, microscopic 4-8     Bacteria, U Microscopic trace     Mucus, UA neg     Epithelial cells, urine per micros 0-5     Crystals, Ur, HPF, POC neg     Casts, Ur, LPF, POC neg     Yeast, UA neg

## 2013-06-04 NOTE — Patient Instructions (Signed)
Urinary Tract Infection  Urinary tract infections (UTIs) can develop anywhere along your urinary tract. Your urinary tract is your body's drainage system for removing wastes and extra water. Your urinary tract includes two kidneys, two ureters, a bladder, and a urethra. Your kidneys are a pair of bean-shaped organs. Each kidney is about the size of your fist. They are located below your ribs, one on each side of your spine.  CAUSES  Infections are caused by microbes, which are microscopic organisms, including fungi, viruses, and bacteria. These organisms are so small that they can only be seen through a microscope. Bacteria are the microbes that most commonly cause UTIs.  SYMPTOMS   Symptoms of UTIs may vary by age and gender of the patient and by the location of the infection. Symptoms in young women typically include a frequent and intense urge to urinate and a painful, burning feeling in the bladder or urethra during urination. Older women and men are more likely to be tired, shaky, and weak and have muscle aches and abdominal pain. A fever may mean the infection is in your kidneys. Other symptoms of a kidney infection include pain in your back or sides below the ribs, nausea, and vomiting.  DIAGNOSIS  To diagnose a UTI, your caregiver will ask you about your symptoms. Your caregiver also will ask to provide a urine sample. The urine sample will be tested for bacteria and white blood cells. White blood cells are made by your body to help fight infection.  TREATMENT   Typically, UTIs can be treated with medication. Because most UTIs are caused by a bacterial infection, they usually can be treated with the use of antibiotics. The choice of antibiotic and length of treatment depend on your symptoms and the type of bacteria causing your infection.  HOME CARE INSTRUCTIONS   If you were prescribed antibiotics, take them exactly as your caregiver instructs you. Finish the medication even if you feel better after you  have only taken some of the medication.   Drink enough water and fluids to keep your urine clear or pale yellow.   Avoid caffeine, tea, and carbonated beverages. They tend to irritate your bladder.   Empty your bladder often. Avoid holding urine for long periods of time.   Empty your bladder before and after sexual intercourse.   After a bowel movement, women should cleanse from front to back. Use each tissue only once.  SEEK MEDICAL CARE IF:    You have back pain.   You develop a fever.   Your symptoms do not begin to resolve within 3 days.  SEEK IMMEDIATE MEDICAL CARE IF:    You have severe back pain or lower abdominal pain.   You develop chills.   You have nausea or vomiting.   You have continued burning or discomfort with urination.  MAKE SURE YOU:    Understand these instructions.   Will watch your condition.   Will get help right away if you are not doing well or get worse.  Document Released: 12/17/2004 Document Revised: 09/08/2011 Document Reviewed: 04/17/2011  [employer] Patient Information 2014 [employer].

## 2013-07-07 ENCOUNTER — Ambulatory Visit (INDEPENDENT_AMBULATORY_CARE_PROVIDER_SITE_OTHER): Payer: Medicare Other | Admitting: Internal Medicine

## 2013-07-07 VITALS — BP 118/70 | HR 63 | Temp 98.6°F | Resp 16 | Ht 62.0 in | Wt 218.0 lb

## 2013-07-07 DIAGNOSIS — R300 Dysuria: Secondary | ICD-10-CM

## 2013-07-07 DIAGNOSIS — N390 Urinary tract infection, site not specified: Secondary | ICD-10-CM | POA: Insufficient documentation

## 2013-07-07 DIAGNOSIS — IMO0001 Reserved for inherently not codable concepts without codable children: Secondary | ICD-10-CM

## 2013-07-07 DIAGNOSIS — R350 Frequency of micturition: Secondary | ICD-10-CM

## 2013-07-07 LAB — POCT UA - MICROSCOPIC ONLY
Crystals, Ur, HPF, POC: NEGATIVE
Epithelial cells, urine per micros: NEGATIVE
Mucus, UA: NEGATIVE
Yeast, UA: NEGATIVE

## 2013-07-07 LAB — POCT URINALYSIS DIPSTICK
Bilirubin, UA: NEGATIVE
GLUCOSE UA: NEGATIVE
KETONES UA: NEGATIVE
Nitrite, UA: NEGATIVE
SPEC GRAV UA: 1.01
UROBILINOGEN UA: 0.2
pH, UA: 5.5

## 2013-07-07 MED ORDER — SULFAMETHOXAZOLE-TMP DS 800-160 MG PO TABS: 1.0000 | ORAL_TABLET | Freq: Two times a day (BID) | ORAL | Status: DC

## 2013-07-07 NOTE — Progress Notes (Signed)
This chart was scribed for San[employer]-SCI[employer] P. Merla Richesoolittle, MD by Beverly MilchJ Harrison Collins, ED Scribe. This patient was seen in room 13 and the patient's care was started at 6:31 PM.  Subjective:    Patient ID: Susan ShearsMargaret B Boone, female    DOB: [birth year], 79 y.o.   MRN: [id number]  HPI HPI Comments: Susan Boone is a 79 y.o. female who presents to the [facility] complaining of a UTI that began yesterday. She reports associated nausea, burning, and frequency. She states she took her last cipro 5 days ago for her most recent UTI. Pt denies fever with her usual UTI. Pt reports she has had three UTIs since January. Pt states she sees a [facility]rologist, [facility]. She states she has a h/o kidney stones as well. She also reports a h/o uric acid stones. Pt states Cipro gives her moderate joint pain.  Prior to Admission medications   Medication Sig Start Date End Date Taking? Authorizing Provider  hyoscyamine (HYOMAX) 0.125 MG tablet Take 0.125 mg by mouth every 4 (four) hours as needed.   Yes Historical Provider, MD  loratadine (CLARITIN) 10 MG tablet Take 10 mg by mouth daily.   Yes Historical Provider, MD  nadolol (CORGARD) 80 MG tablet Take 80 mg by mouth daily.   Yes Historical Provider, MD  naproxen sodium (ANAPROX) 220 MG tablet Take 220 mg by mouth 2 (two) times daily with a meal.   Yes Historical Provider, MD  potassium citrate (UROCIT-K) 10 MEQ (1080 MG) SR tablet Take 10 mEq by mouth 3 (three) times daily with meals.   Yes Historical Provider, MD  ciprofloxacin (CIPRO) 250 MG tablet Take 1 tablet (250 mg total) by mouth 2 (two) times daily. 01/13/13   Collene GobbleSteven A Daub, MD  ciprofloxacin (CIPRO) 500 MG tablet Take 1 tablet (500 mg total) by mouth 2 (two) times daily. 06/04/13   Phillips OdorJeffery Anderson, MD  levofloxacin (LEVAQUIN) 500 MG tablet Take 1 tablet (500 mg total) by mouth daily. 01/31/13   Elvina SidleKurt Lauenstein, MD  phenazopyridine (PYRIDIUM) 200 MG  tablet Take 1 tablet (200 mg total) by mouth 3 (three) times daily as needed for pain. 06/04/13   Phillips OdorJeffery Anderson, MD  ranitidine (ZANTAC) 150 MG capsule Take 150 mg by mouth 2 (two) times daily.    Historical Provider, MD     Review of Systems  Gastrointestinal: Positive for nausea.  Genitourinary: Positive for dysuria and frequency.  All other systems reviewed and are negative.      Objective:   Physical Exam  Nursing note and vitals reviewed. Constitutional: She is oriented to person, place, and time. She appears well-developed and well-nourished.  HENT:  Head: Normocephalic and atraumatic.  Neck: Neck supple.  Pulmonary/Chest: Effort normal.  Abdominal: Soft. She exhibits no distension. There is no tenderness.  Musculoskeletal:  No cva tend  Neurological: She is alert and oriented to person, place, and time.  Psychiatric: She has a normal mood and affect. Her behavior is normal.     Filed Vitals:   07/07/13 1747  BP: 118/70  Pulse: 63  Temp: 98.6 F (37 C)  Resp: 16  Height: 5\' 2"  (1.575 m)  Weight: 218 lb (98.884 kg)  SpO2: 97%        Assessment & Pla  DIAGNOSTIC STUDIES: Oxygen Saturation is 97% on RA, adequate by my interpretation.    COORDINATION OF CARE: Pt advised of plan for treatment and pt agrees.  Dysuria - Plan: POCT urinalysis dipstick, Urine culture, POCT UA - Microscopic Only  Frequency - Plan: POCT urinalysis dipstick, Urine culture, POCT UA - Microscopic Only  Recurrent UTI  Septra ds //ur culture  ?relapse resis to cipro vs mechanical obstruc with freq UTIs Hx sxt allergy dates to childhood and is not specific for any symptoms--has never been sure about this and would like to try septra willing to risk rash/rxn since this would be a good drug in this situation

## 2013-07-10 LAB — URINE CULTURE: Colony Count: 45000

## 2013-07-18 ENCOUNTER — Encounter: Payer: Self-pay | Admitting: Internal Medicine

## 2013-11-06 ENCOUNTER — Ambulatory Visit (INDEPENDENT_AMBULATORY_CARE_PROVIDER_SITE_OTHER): Payer: Medicare Other | Admitting: Family Medicine

## 2013-11-06 VITALS — BP 142/62 | HR 62 | Temp 97.9°F | Resp 16 | Ht 63.5 in | Wt 220.0 lb

## 2013-11-06 DIAGNOSIS — R300 Dysuria: Secondary | ICD-10-CM

## 2013-11-06 DIAGNOSIS — N3000 Acute cystitis without hematuria: Secondary | ICD-10-CM

## 2013-11-06 LAB — POCT UA - MICROSCOPIC ONLY
CRYSTALS, UR, HPF, POC: NEGATIVE
Casts, Ur, LPF, POC: NEGATIVE
Mucus, UA: NEGATIVE
Yeast, UA: NEGATIVE

## 2013-11-06 LAB — POCT URINALYSIS DIPSTICK
Bilirubin, UA: NEGATIVE
Glucose, UA: NEGATIVE
KETONES UA: NEGATIVE
Nitrite, UA: NEGATIVE
PH UA: 6
PROTEIN UA: NEGATIVE
Urobilinogen, UA: 0.2

## 2013-11-06 MED ORDER — CEPHALEXIN 500 MG PO CAPS: 500.0000 mg | ORAL_CAPSULE | Freq: Three times a day (TID) | ORAL | Status: DC

## 2013-11-06 NOTE — Patient Instructions (Signed)

## 2013-11-06 NOTE — Progress Notes (Signed)
Subjective:    Patient ID: Susan Boone, female    DOB: 03/23/1935, 79 y.o.   MRN: [id number]  This chart was scribed for Dr. Norberto Sorenson, MD by Jarvis Morgan, ED Scribe. This patient was seen in Room 12 and the patient's care was started at 5:52 PM.  Chief Complaint  Patient presents with  . Urinary Tract Infection    x 2 dAYS     HPI HPI Comments: Susan Boone is a 79 y.o. female with a h/o orthostatic hypertension, esophageal reflux, arthritis and kidney stones, who presents to the [facility] complaining of a possible UTI for 2 days. Patient states that her symptoms are the same as they have been in the past. She is having associated burning dysuria and urinary incontinence. Although she notes she has urinary incontinence at baseline for the past couple of years and wears adult diapers. She states drinking water helps to alleviate the pain in the past but states that has not been the case this time. Pt has a h/o recurrent UTIs. Last was klebsiella. She sees Dr. Sharen Hones at [facility] (Neurologist). H/o uric acid kidney stones. 5th UTI in 2015. Last was treated with Septra. Pt states that she may have taken Septra so much that she worries it may no longer be working. She reports that last time she took Bactrim it provided her with a lot of relief and resolved her UTI. She denies any fever, chills, nausea, emesis, vaginal discharge, or flank pain.    Past Medical History  Diagnosis Date  . Migraine   . H/O orthostatic hypotension   . Esophageal reflux   . Allergy   . Arthritis   . Kidney stones    Current Outpatient Prescriptions on File Prior to Visit  Medication Sig Dispense Refill  . hyoscyamine (HYOMAX) 0.125 MG tablet Take 0.125 mg by mouth every 4 (four) hours as needed.      . nadolol (CORGARD) 80 MG tablet Take 80 mg by mouth daily.      . potassium citrate (UROCIT-K) 10 MEQ (1080 MG) SR tablet Take 10 mEq by mouth 3 (three) times daily with  meals.       No current facility-administered medications on file prior to visit.    Allergies  Allergen Reactions  . Codeine   . Doxycycline   . Sulfonamide Derivatives     Review of Systems  Constitutional: Negative for fever and chills.  Gastrointestinal: Negative for nausea and vomiting.  Genitourinary: Positive for dysuria and frequency. Negative for flank pain and vaginal discharge.    BP 142/62  Pulse 62  Temp(Src) 97.9 F (36.6 C) (Oral)  Resp 16  Ht 5' 3.5" (1.613 m)  Wt 220 lb (99.791 kg)  BMI 38.36 kg/m2  SpO2 96%     Objective:   Physical Exam  Nursing note and vitals reviewed. Constitutional: She is oriented to person, place, and time. She appears well-developed and well-nourished. No distress.  HENT:  Head: Normocephalic and atraumatic.  Eyes: Conjunctivae and EOM are normal.  Neck: Neck supple. No tracheal deviation present.  Cardiovascular: Normal rate, regular rhythm and normal heart sounds.  Exam reveals no gallop and no friction rub.   No murmur heard. Pulmonary/Chest: Effort normal and breath sounds normal. No respiratory distress. She has no wheezes.  Abdominal: Soft. Normal appearance and bowel sounds are normal. She exhibits no distension and no mass. There is no tenderness. There is no rebound, no guarding and  no CVA tenderness.  Musculoskeletal: Normal range of motion.  Neurological: She is alert and oriented to person, place, and time.  Skin: Skin is warm and dry.  Psychiatric: She has a normal mood and affect. Her behavior is normal.   Results for orders placed in visit on 11/06/13  URINE CULTURE      Result Value Ref Range   Culture KLEBSIELLA PNEUMONIAE     Colony Count >=100,000 COLONIES/ML     Organism ID, Bacteria KLEBSIELLA PNEUMONIAE    POCT UA - MICROSCOPIC ONLY      Result Value Ref Range   WBC, Ur, HPF, POC TNTC     RBC, urine, microscopic 3-6     Bacteria, U Microscopic trace     Mucus, UA neg     Epithelial cells, urine  per micros 2-4     Crystals, Ur, HPF, POC neg     Casts, Ur, LPF, POC neg     Yeast, UA neg    POCT URINALYSIS DIPSTICK      Result Value Ref Range   Color, UA yellow     Clarity, UA cloudy     Glucose, UA neg     Bilirubin, UA neg     Ketones, UA neg     Spec Grav, UA <=1.005     Blood, UA small     pH, UA 6.0     Protein, UA neg     Urobilinogen, UA 0.2     Nitrite, UA neg     Leukocytes, UA large (3+)           Assessment & Plan:   Keflex 3x daily for 1 week. Can use Bactrim with next UTI since she had such great relief with it previously.  Dysuria - Plan: POCT UA - Microscopic Only, POCT urinalysis dipstick, Urine culture  Acute cystitis without hematuria - Plan: Urine culture  Meds ordered this encounter  Medications  . cephALEXin (KEFLEX) 500 MG capsule    Sig: Take 1 capsule (500 mg total) by mouth 3 (three) times daily.    Dispense:  21 capsule    Refill:  0    I personally performed the services described in this documentation, which was scribed in my presence. The recorded information has been reviewed and considered, and addended by me as needed.  Norberto SorensonEva Britain Saber, MD MPH

## 2013-11-09 LAB — URINE CULTURE: Colony Count: >=100000

## 2013-11-18 ENCOUNTER — Ambulatory Visit (INDEPENDENT_AMBULATORY_CARE_PROVIDER_SITE_OTHER): Payer: Medicare Other | Admitting: Internal Medicine

## 2013-11-18 VITALS — BP 116/72 | HR 64 | Temp 98.6°F | Resp 18 | Ht 63.5 in | Wt 220.0 lb

## 2013-11-18 DIAGNOSIS — N23 Unspecified renal colic: Secondary | ICD-10-CM

## 2013-11-18 DIAGNOSIS — N200 Calculus of kidney: Secondary | ICD-10-CM

## 2013-11-18 DIAGNOSIS — N390 Urinary tract infection, site not specified: Secondary | ICD-10-CM

## 2013-11-18 DIAGNOSIS — R300 Dysuria: Secondary | ICD-10-CM

## 2013-11-18 LAB — POCT UA - MICROSCOPIC ONLY
CRYSTALS, UR, HPF, POC: NEGATIVE
Casts, Ur, LPF, POC: NEGATIVE
EPITHELIAL CELLS, URINE PER MICROSCOPY: NEGATIVE
MUCUS UA: NEGATIVE
RBC, urine, microscopic: NEGATIVE
Yeast, UA: NEGATIVE

## 2013-11-18 LAB — POCT URINALYSIS DIPSTICK
BILIRUBIN UA: NEGATIVE
Glucose, UA: NEGATIVE
Ketones, UA: NEGATIVE
Nitrite, UA: POSITIVE
Protein, UA: 30
SPEC GRAV UA: 1.01
UROBILINOGEN UA: 0.2
pH, UA: 5

## 2013-11-18 MED ORDER — PHENAZOPYRIDINE HCL 200 MG PO TABS: 200.0000 mg | ORAL_TABLET | Freq: Three times a day (TID) | ORAL | Status: DC | PRN

## 2013-11-18 MED ORDER — HYDROCODONE-ACETAMINOPHEN 5-325 MG PO TABS: 1.0000 | ORAL_TABLET | Freq: Four times a day (QID) | ORAL | Status: DC | PRN

## 2013-11-18 MED ORDER — LEVOFLOXACIN 500 MG PO TABS: 500.0000 mg | ORAL_TABLET | Freq: Every day | ORAL | Status: DC

## 2013-11-18 NOTE — Progress Notes (Signed)
Subjective:  This chart was scribed for Susan Sia, Susan Boone by Carl Best, Medical Scribe. This patient was seen in Room  and the patient's care was started at 2:46 PM.   Patient ID: Susan Boone, female    DOB: 1934-09-03, 79 y.o.   MRN: [id number]  HPI HPI Comments: Susan Boone is a 80 y.o. female who presents to the [facility] complaining of a recurrent UTI with associated left flank pain.  She experienced relief to her UTI after taking Keflex but her symptoms returned a day after she stopped taking the antibiotic.  She denies fever, cough, SOB, diaphoresis, chills, and hematuria as associated symptoms.  She lists mild nausea and abdominal pain as associated symptoms.  She has a history of surgery on her left kidney to remove Staghorn stones.  She states that her surgeon, Dr. Sharen Hones told her that there were some stones that could not be removed.  She has a history of stomach ulcers.  She is allergic to codeine.  She has taken Hydrocodone in the past and responded well to the medication.     Past Medical History  Diagnosis Date  . Migraine   . H/O orthostatic hypotension   . Esophageal reflux   . Allergy   . Arthritis   . Kidney stones    Past Surgical History  Procedure Laterality Date  . Tubal ligation    . Appendectomy    . Lithotripsy    . Cholecystectomy     Family History  Problem Relation Age of Onset  . Stroke Father   . Hyperlipidemia Father   . Lung cancer Mother    History   Social History  . Marital Status: Divorced    Spouse Name: N/A    Number of Children: N/A  . Years of Education: N/A   Occupational History  . Not on file.   Social History Main Topics  . Smoking status: Never Smoker   . Smokeless tobacco: Not on file  . Alcohol Use: No  . Drug Use: No  . Sexual Activity: Not on file   Other Topics Concern  . Not on file   Social History Narrative  . No narrative on file   Allergies  Allergen Reactions    . Codeine   . Doxycycline   . Sulfonamide Derivatives     Review of Systems  Constitutional: Negative for fever, chills and diaphoresis.  Respiratory: Negative for cough and shortness of breath.   Gastrointestinal: Positive for nausea and abdominal pain.  Genitourinary: Positive for flank pain. Negative for hematuria.     Objective:  Physical Exam  Nursing note and vitals reviewed. Constitutional: She is oriented to person, place, and time. She appears well-developed and well-nourished. No distress.  HENT:  Head: Normocephalic and atraumatic.  Eyes: Conjunctivae and EOM are normal. Pupils are equal, round, and reactive to light.  Neck: Neck supple.  Cardiovascular: Normal rate.   Pulmonary/Chest: Effort normal.  Abdominal: There is tenderness.  Mild tenderness to percussion of the left flank.  Neurological: She is alert and oriented to person, place, and time. No cranial nerve deficit.  Psychiatric: She has a normal mood and affect. Her behavior is normal.   Results for orders placed in visit on 11/18/13  URINE CULTURE      Result Value Ref Range            POCT URINALYSIS DIPSTICK      Result Value Ref Range  Color, UA yellow     Clarity, UA cloudy     Glucose, UA neg     Bilirubin, UA neg     Ketones, UA neg     Spec Grav, UA 1.010     Blood, UA moderate     pH, UA 5.0     Protein, UA 30     Urobilinogen, UA 0.2     Nitrite, UA positive     Leukocytes, UA large (3+)    POCT UA - MICROSCOPIC ONLY      Result Value Ref Range   WBC, Ur, HPF, POC tntc     RBC, urine, microscopic neg     Bacteria, U Microscopic 3+     Mucus, UA neg     Epithelial cells, urine per micros neg     Crystals, Ur, HPF, POC neg     Casts, Ur, LPF, POC neg     Yeast, UA neg       BP 116/72  Pulse 64  Temp(Src) 98.6 F (37 C) (Oral)  Resp 18  Ht 5' 3.5" (1.613 m)  Wt 220 lb (99.791 kg)  BMI 38.36 kg/m2  SpO2 99% Assessment & Plan:  Recurrent UTI - Plan: US Renal, Urine  culture  Burning with urination - Plan: POCT urinalysis dipstick, POCT UA - Microscopic Only  flank pain - Plan: POCT urinalysis dipstick, POCT UA - Microscopic Only  Staghorn calculus - Plan: US Renal  Meds ordered this encounter  Medications  . HYDROcodone-acetaminophen (NORCO/VICODIN) 5-325 MG per tablet    Sig: Take 1 tablet by mouth every 6 (six) hours as needed for moderate pain.  . phenazopyridine (PYRIDIUM) 200 MG tablet    Sig: Take 200 mg by mouth 3 (three) times daily as needed for pain.  Marland Kitchen levofloxacin (LEVAQUIN) 500 MG tablet    Sig: Take 1 tablet (500 mg total) by mouth daily.    Dispense:  10 tablet    Refill:  0  . HYDROcodone-acetaminophen (NORCO/VICODIN) 5-325 MG per tablet    Sig: Take 1 tablet by mouth every 6 (six) hours as needed for moderate pain.    Dispense:  20 tablet    Refill:  0  . phenazopyridine (PYRIDIUM) 200 MG tablet    Sig: Take 1 tablet (200 mg total) by mouth 3 (three) times daily as needed for pain.    Dispense:  10 tablet    Refill:  0   Imaging needed to rule out perinephric abscess post surgery/recurrent stone with hydronephrosis etc.  I personally performed the services described in this documentation, which was scribed in my presence. The recorded information has been reviewed and is accurate.

## 2013-11-20 LAB — URINE CULTURE: Colony Count: >=100000

## 2013-11-22 ENCOUNTER — Encounter: Payer: Self-pay | Admitting: Internal Medicine

## 2013-11-24 ENCOUNTER — Ambulatory Visit
Admission: RE | Admit: 2013-11-24 | Discharge: 2013-11-24 | Disposition: A | Discharge status: Home / Self Care | Payer: Medicare Other | Source: Ambulatory Visit | Attending: Internal Medicine | Admitting: Internal Medicine

## 2013-11-24 DIAGNOSIS — N200 Calculus of kidney: Secondary | ICD-10-CM

## 2013-11-24 DIAGNOSIS — N390 Urinary tract infection, site not specified: Secondary | ICD-10-CM

## 2013-12-27 ENCOUNTER — Ambulatory Visit (INDEPENDENT_AMBULATORY_CARE_PROVIDER_SITE_OTHER): Payer: Medicare Other | Admitting: Family Medicine

## 2013-12-27 VITALS — BP 126/82 | HR 65 | Temp 98.9°F | Resp 18 | Ht 63.0 in | Wt 217.6 lb

## 2013-12-27 DIAGNOSIS — R319 Hematuria, unspecified: Secondary | ICD-10-CM

## 2013-12-27 DIAGNOSIS — N3941 Urge incontinence: Secondary | ICD-10-CM

## 2013-12-27 DIAGNOSIS — R350 Frequency of micturition: Secondary | ICD-10-CM

## 2013-12-27 DIAGNOSIS — N390 Urinary tract infection, site not specified: Secondary | ICD-10-CM

## 2013-12-27 LAB — POCT URINALYSIS DIPSTICK
Bilirubin, UA: NEGATIVE
GLUCOSE UA: 100
Ketones, UA: NEGATIVE
Nitrite, UA: POSITIVE
PH UA: 5.5
Protein, UA: 100
SPEC GRAV UA: 1.02
UROBILINOGEN UA: 1

## 2013-12-27 LAB — POCT UA - MICROSCOPIC ONLY
CRYSTALS, UR, HPF, POC: NEGATIVE
Casts, Ur, LPF, POC: NEGATIVE
Yeast, UA: NEGATIVE

## 2013-12-27 MED ORDER — SULFAMETHOXAZOLE-TMP DS 800-160 MG PO TABS: 1.0000 | ORAL_TABLET | Freq: Two times a day (BID) | ORAL | Status: DC

## 2013-12-27 NOTE — Progress Notes (Signed)
Chief Complaint:  Chief Complaint  Patient presents with  . Urinary Frequency    pt is taking pyridium  . burning    pt states she is having burning when urinating  . Other    pt states her urine has an odor to it    HPI: Susan Boone is a 79 y.o. female who is here for  Recurrent UTI sx She has increase frequency, urgency, odor and dysuria SHe has had Klebsiella  onher most recent urine cx and was dx many times with UTIs in the last year She has had no fevers or chills She has incontinence and has not taken anything for the urinary incontinence, she thinks this may be subsequent sequelae of the incontinence She has been on levaquin and also kelfex and she has had to return more frequenty due to recurrence on these meds, she was on Bactrim in April and di not have a recurrence until 4 months later [place] stste sit worked the best for her, she was sxs free for 4 months. She also states the other meds made her normal jt pain just a little bit worse [facility]she sees for a history of staghorn Calculus, Dr Malen GauzeFoster She has not seen her sicne she saw the surgeon who took care of her renal stones.  She had a recent US to make sure everything was ok and there was nothing wrong with her kidneys  IMPRESSION:  1. Suspected left-sided nonobstructing nephrolithiasis. Further  evaluation could be performed with repeat noncontrast abdominal CT  as clinically indicated.  2. No definite evidence of right-sided nephrolithiasis though  evaluation somewhat degraded secondary to patient body habitus and  poor sonographic window.   Past Medical History  Diagnosis Date  . Migraine   . H/O orthostatic hypotension   . Esophageal reflux   . Allergy   . Arthritis   . Kidney stones    Past Surgical History  Procedure Laterality Date  . Tubal ligation    . Appendectomy    . Lithotripsy    . Cholecystectomy     History   Social  History  . Marital Status: Divorced    Spouse Name: N/A    Number of Children: N/A  . Years of Education: N/A   Social History Main Topics  . Smoking status: Never Smoker   . Smokeless tobacco: None  . Alcohol Use: No  . Drug Use: No  . Sexual Activity: None   Other Topics Concern  . None   Social History Narrative  . None   Family History  Problem Relation Age of Onset  . Stroke Father   . Hyperlipidemia Father   . Lung cancer Mother    Allergies  Allergen Reactions  . Codeine   . Doxycycline    Prior to Admission medications   Medication Sig Start Date End Date Taking? Authorizing Provider  nadolol (CORGARD) 80 MG tablet Take 80 mg by mouth daily.   Yes Historical Provider, MD  phenazopyridine (PYRIDIUM) 200 MG tablet Take 200 mg by mouth 3 (three) times daily as needed for pain.   Yes Historical Provider, MD  phenazopyridine (PYRIDIUM) 200 MG tablet Take 1 tablet (200 mg total) by mouth 3 (three) times daily as needed for pain. 11/18/13  Yes Tonye Pearsonobert P Doolittle, MD  potassium citrate (UROCIT-K) 10 MEQ (1080 MG) SR tablet Take 10 mEq by mouth 3 (three) times daily with  meals.   Yes Historical Provider, MD  cephALEXin (KEFLEX) 500 MG capsule Take 1 capsule (500 mg total) by mouth 3 (three) times daily. 11/06/13   Sherren Mocha, MD  HYDROcodone-acetaminophen (NORCO/VICODIN) 5-325 MG per tablet Take 1 tablet by mouth every 6 (six) hours as needed for moderate pain.    Historical Provider, MD  HYDROcodone-acetaminophen (NORCO/VICODIN) 5-325 MG per tablet Take 1 tablet by mouth every 6 (six) hours as needed for moderate pain. 11/18/13   Tonye Pearson, MD  hyoscyamine (HYOMAX) 0.125 MG tablet Take 0.125 mg by mouth every 4 (four) hours as needed.    Historical Provider, MD  levofloxacin (LEVAQUIN) 500 MG tablet Take 1 tablet (500 mg total) by mouth daily. 11/18/13   Tonye Pearson, MD     ROS: The patient denies fevers, chills, night sweats, unintentional weight loss, chest  pain, palpitations, wheezing, dyspnea on exertion, nausea, vomiting, abdominal pain,  hematuria, melena, numbness, weakness, or tingling.   All other systems have been reviewed and were otherwise negative with the exception of those mentioned in the HPI and as above.    PHYSICAL EXAM: Filed Vitals:   12/27/13 1709  BP: 126/82  Pulse: 65  Temp: 98.9 F (37.2 C)  Resp: 18   Filed Vitals:   12/27/13 1709  Height: 5\' 3"  (1.6 m)  Weight: 217 lb 9.6 oz (98.703 kg)   Body mass index is 38.56 kg/(m^2).  General: Alert, no acute distress HEENT:  Normocephalic, atraumatic, oropharynx patent. EOMI, PERRLA Cardiovascular:  Regular rate and rhythm, no rubs murmurs or gallops.  Radial pulse intact. No pedal edema.  Respiratory: Clear to auscultation bilaterally.  No wheezes, rales, or rhonchi.  No cyanosis, no use of accessory musculature GI: No organomegaly, abdomen is soft and non-tender, positive bowel sounds.  No masses. Skin: No rashes. Neurologic: Facial musculature symmetric. Psychiatric: Patient is appropriate throughout our interaction. Lymphatic: No cervical lymphadenopathy Musculoskeletal: Gait intact. Slowed due to jt pain. NO CVA   LABS: Results for orders placed in visit on 12/27/13  POCT UA - MICROSCOPIC ONLY      Result Value Ref Range   WBC, Ur, HPF, POC TNTC     RBC, urine, microscopic 15-20     Bacteria, U Microscopic 3+     Mucus, UA trace     Epithelial cells, urine per micros 2-5     Crystals, Ur, HPF, POC neg     Casts, Ur, LPF, POC neg     Yeast, UA neg    POCT URINALYSIS DIPSTICK      Result Value Ref Range   Color, UA orange     Clarity, UA cloudy     Glucose, UA 100     Bilirubin, UA negative     Ketones, UA negative     Spec Grav, UA 1.020     Blood, UA moderate     pH, UA 5.5     Protein, UA 100     Urobilinogen, UA 1.0     Nitrite, UA positive     Leukocytes, UA large (3+)       EKG/XRAY:   Primary read interpreted by Dr. Conley Rolls at  [facility].   ASSESSMENT/PLAN: Encounter Diagnoses  Name Primary?  . Frequent urination   . Urinary tract infection with hematuria, site unspecified Yes  . Urge incontinence of urine    Recurrent UTI-this is her 8th UTI visit in 1 year Rx Bactrim DS Return to urology for follow-up care, discussed  options for incontinence, she has a medicine for it that the urologist rx and will try taking it, she will f.u with urology perhaps need ppx low dose abx for prevention  Urine cx pending   Gross sideeffects, risk and benefits, and alternatives of medications d/w patient. Patient is aware that all medications have potential sideeffects and we are unable to predict every sideeffect or drug-drug interaction that may occur.  Hamilton Capri PHUONG, DO 12/27/2013 5:47 PM

## 2013-12-27 NOTE — Patient Instructions (Signed)
Urinary Tract Infection °Urinary tract infections (UTIs) can develop anywhere along your urinary tract. Your urinary tract is your body's drainage system for removing wastes and extra water. Your urinary tract includes two kidneys, two ureters, a bladder, and a urethra. Your kidneys are a pair of bean-shaped organs. Each kidney is about the size of your fist. They are located below your ribs, one on each side of your spine. °CAUSES °Infections are caused by microbes, which are microscopic organisms, including fungi, viruses, and bacteria. These organisms are so small that they can only be seen through a microscope. Bacteria are the microbes that most commonly cause UTIs. °SYMPTOMS  °Symptoms of UTIs may vary by age and gender of the patient and by the location of the infection. Symptoms in young women typically include a frequent and intense urge to urinate and a painful, burning feeling in the bladder or urethra during urination. Older women and men are more likely to be tired, shaky, and weak and have muscle aches and abdominal pain. A fever may mean the infection is in your kidneys. Other symptoms of a kidney infection include pain in your back or sides below the ribs, nausea, and vomiting. °DIAGNOSIS °To diagnose a UTI, your caregiver will ask you about your symptoms. Your caregiver also will ask to provide a urine sample. The urine sample will be tested for bacteria and white blood cells. White blood cells are made by your body to help fight infection. °TREATMENT  °Typically, UTIs can be treated with medication. Because most UTIs are caused by a bacterial infection, they usually can be treated with the use of antibiotics. The choice of antibiotic and length of treatment depend on your symptoms and the type of bacteria causing your infection. °HOME CARE INSTRUCTIONS °· If you were prescribed antibiotics, take them exactly as your caregiver instructs you. Finish the medication even if you feel better after you  have only taken some of the medication. °· Drink enough water and fluids to keep your urine clear or pale yellow. °· Avoid caffeine, tea, and carbonated beverages. They tend to irritate your bladder. °· Empty your bladder often. Avoid holding urine for long periods of time. °· Empty your bladder before and after sexual intercourse. °· After a bowel movement, women should cleanse from front to back. Use each tissue only once. °SEEK MEDICAL CARE IF:  °· You have back pain. °· You develop a fever. °· Your symptoms do not begin to resolve within 3 days. °SEEK IMMEDIATE MEDICAL CARE IF:  °· You have severe back pain or lower abdominal pain. °· You develop chills. °· You have nausea or vomiting. °· You have continued burning or discomfort with urination. °MAKE SURE YOU:  °· Understand these instructions. °· Will watch your condition. °· Will get help right away if you are not doing well or get worse. °Document Released: 12/17/2004 Document Revised: 09/08/2011 Document Reviewed: 04/17/2011 °ExitCare® Patient Information ©2015 [employer]. This information is not intended to replace advice given to you by your health care provider. Make sure you discuss any questions you have with your health care provider. ° °

## 2013-12-29 LAB — URINE CULTURE: Colony Count: >=100000

## 2014-01-05 ENCOUNTER — Telehealth: Payer: Self-pay | Admitting: Family Medicine

## 2014-01-05 DIAGNOSIS — N390 Urinary tract infection, site not specified: Secondary | ICD-10-CM

## 2014-01-05 NOTE — Telephone Encounter (Signed)
Spoke with patient she is sxs free after 24 hrs on bactrim, encouraged her to see her urologist if needs to be on PPX abx, shewill come back for urine cx since wants reassurance. She will get flu vaccine when she comes ine for her urien cx labs only.

## 2014-03-19 ENCOUNTER — Ambulatory Visit
Admission: RE | Admit: 2014-03-19 | Discharge: 2014-03-19 | Disposition: A | Discharge status: Home / Self Care | Payer: Medicare Other | Source: Ambulatory Visit | Attending: Family Medicine | Admitting: Family Medicine

## 2014-03-19 ENCOUNTER — Ambulatory Visit (INDEPENDENT_AMBULATORY_CARE_PROVIDER_SITE_OTHER): Payer: Medicare Other | Admitting: Family Medicine

## 2014-03-19 VITALS — BP 126/74 | HR 63 | Temp 98.5°F | Resp 16 | Ht 62.0 in | Wt 218.9 lb

## 2014-03-19 DIAGNOSIS — Z87442 Personal history of urinary calculi: Secondary | ICD-10-CM

## 2014-03-19 DIAGNOSIS — R1032 Left lower quadrant pain: Secondary | ICD-10-CM

## 2014-03-19 DIAGNOSIS — R109 Unspecified abdominal pain: Secondary | ICD-10-CM

## 2014-03-19 LAB — POCT URINALYSIS DIPSTICK
BILIRUBIN UA: NEGATIVE
Glucose, UA: NEGATIVE
Ketones, UA: NEGATIVE
NITRITE UA: NEGATIVE
PROTEIN UA: NEGATIVE
Spec Grav, UA: <=1.005
Urobilinogen, UA: 0.2
pH, UA: 5.5

## 2014-03-19 LAB — POCT CBC
GRANULOCYTE PERCENT: 67.5 % (ref 37–80)
HCT, POC: 47.2 % (ref 37.7–47.9)
Hemoglobin: 14.7 g/dL (ref 12.2–16.2)
Lymph, poc: 2.5 (ref 0.6–3.4)
MCH, POC: 27.3 pg (ref 27–31.2)
MCHC: 31.1 g/dL — AB (ref 31.8–35.4)
MCV: 87.7 fL (ref 80–97)
MID (CBC): 0.7 (ref 0–0.9)
MPV: 6.9 fL (ref 0–99.8)
PLATELET COUNT, POC: 218 10*3/uL (ref 142–424)
POC Granulocyte: 6.7 (ref 2–6.9)
POC LYMPH PERCENT: 25.6 %L (ref 10–50)
POC MID %: 6.9 % (ref 0–12)
RBC: 5.38 M/uL (ref 4.04–5.48)
RDW, POC: 16 %
WBC: 9.9 10*3/uL (ref 4.6–10.2)

## 2014-03-19 LAB — COMPREHENSIVE METABOLIC PANEL
ALT: 107 U/L — AB (ref 0–35)
AST: 107 U/L — ABNORMAL HIGH (ref 0–37)
Albumin: 4 g/dL (ref 3.5–5.2)
Alkaline Phosphatase: 70 U/L (ref 39–117)
BILIRUBIN TOTAL: 1 mg/dL (ref 0.2–1.2)
BUN: 13 mg/dL (ref 6–23)
CALCIUM: 9.3 mg/dL (ref 8.4–10.5)
CO2: 29 meq/L (ref 19–32)
CREATININE: 1 mg/dL (ref 0.50–1.10)
Chloride: 99 mEq/L (ref 96–112)
GLUCOSE: 119 mg/dL — AB (ref 70–99)
Potassium: 4.6 mEq/L (ref 3.5–5.3)
Sodium: 137 mEq/L (ref 135–145)
Total Protein: 6.6 g/dL (ref 6.0–8.3)

## 2014-03-19 LAB — POCT UA - MICROSCOPIC ONLY
CASTS, UR, LPF, POC: NEGATIVE
Crystals, Ur, HPF, POC: NEGATIVE
Mucus, UA: NEGATIVE
YEAST UA: NEGATIVE

## 2014-03-19 MED ORDER — TAMSULOSIN HCL 0.4 MG PO CAPS: 0.4000 mg | ORAL_CAPSULE | Freq: Every day | ORAL | Status: DC

## 2014-03-19 MED ORDER — HYDROCODONE-ACETAMINOPHEN 5-325 MG PO TABS: 1.0000 | ORAL_TABLET | Freq: Four times a day (QID) | ORAL | Status: DC | PRN

## 2014-03-19 MED ORDER — RANITIDINE HCL 150 MG PO TABS: 150.0000 mg | ORAL_TABLET | Freq: Two times a day (BID) | ORAL | Status: DC

## 2014-03-19 NOTE — Progress Notes (Signed)
[facility] [address], [place] [address] [phone]  Date:  03/19/2014   Name:  Susan ShearsMargaret B Boone   DOB:  March 27, 1934   MRN:  [id number]  PCP:  No PCP Per Patient    Chief Complaint: Flank Pain   History of Present Illness:  Susan ShearsMargaret B Boone is a 79 y.o. very pleasant female patient who presents with the following:  History of frequent UTI and kidney stones. Here today with possible kidney stone. "I've had hundreds of kidney stones, I've had kidney stone surgery."  In 2013 she had an obstructive stone on the left.  She had surgery to remove most of the stone "through my back."  She is also known to have some stones on the right which were removed through her urethra, she had a stent placed and then removed- also in 2013.   She has to use pads for urinary incontinence since 2013.   Last night she noted some left flank discomfort.  This felt like kidney pain to her.   She has not noted hematuria.  Minimal dysuria- "it's not like a UTI."   She also has some discomfort in her lower left abdomen.   She was having more severe pain last night with vomiting.  She had some leftover hydrocodone which she took- it did seem to help and she was able to sleep some overnight.  However this bothered her stomach so she took a zantac.   No vomiting so far today.  She does not feel like eating but did take some applesauce and a banana.  No diarrhea, she has not measured a fever.    [facility]  However she would like to change to seeing someone not in the hospital system for convenience.    Patient Active Problem List   Diagnosis Date Noted  . Recurrent UTI 07/07/2013  . UMBILICAL HERNIA 10/20/2007  . IRRITABLE BOWEL SYNDROME 10/20/2007  . RENAL CYST, LEFT 10/20/2007  . ARTHRITIS 10/20/2007  . DYSPHAGIA UNSPECIFIED 10/20/2007  . DYSPHAGIA 10/20/2007  . DIARRHEA 10/20/2007  . ANEMIA, HX OF 10/20/2007  . NEPHROLITHIASIS,  HX OF 10/20/2007    Past Medical History  Diagnosis Date  . Migraine   . H/O orthostatic hypotension   . Esophageal reflux   . Allergy   . Arthritis   . Kidney stones     Past Surgical History  Procedure Laterality Date  . Tubal ligation    . Appendectomy    . Lithotripsy    . Cholecystectomy      History  Substance Use Topics  . Smoking status: Never Smoker   . Smokeless tobacco: Not on file  . Alcohol Use: No    Family History  Problem Relation Age of Onset  . Stroke Father   . Hyperlipidemia Father   . Lung cancer Mother     Allergies  Allergen Reactions  . Codeine   . Doxycycline     Medication list has been reviewed and updated.  Current Outpatient Prescriptions on File Prior to Visit  Medication Sig Dispense Refill  . HYDROcodone-acetaminophen (NORCO/VICODIN) 5-325 MG per tablet Take 1 tablet by mouth every 6 (six) hours as needed for moderate pain. 20 tablet 0  . nadolol (CORGARD) 80 MG tablet Take 80 mg by mouth daily.    . potassium citrate (UROCIT-K) 10 MEQ (1080 MG) SR tablet Take 10 mEq by mouth 3 (three) times daily with meals.    .Marland Kitchen  HYDROcodone-acetaminophen (NORCO/VICODIN) 5-325 MG per tablet Take 1 tablet by mouth every 6 (six) hours as needed for moderate pain.    . hyoscyamine (HYOMAX) 0.125 MG tablet Take 0.125 mg by mouth every 4 (four) hours as needed.    . phenazopyridine (PYRIDIUM) 200 MG tablet Take 200 mg by mouth 3 (three) times daily as needed for pain.    . phenazopyridine (PYRIDIUM) 200 MG tablet Take 1 tablet (200 mg total) by mouth 3 (three) times daily as needed for pain. (Patient not taking: Reported on 03/19/2014) 10 tablet 0  . sulfamethoxazole-trimethoprim (BACTRIM DS) 800-160 MG per tablet Take 1 tablet by mouth 2 (two) times daily. (Patient not taking: Reported on 03/19/2014) 20 tablet 0   No current facility-administered medications on file prior to visit.    Review of Systems:  As per HPI- otherwise  negative.   Physical Examination: Filed Vitals:   03/19/14 1533  BP: 126/74  Pulse: 63  Temp: 98.5 F (36.9 C)  Resp: 16   Filed Vitals:   03/19/14 1533  Height: 5\' 2"  (1.575 m)  Weight: 218 lb 14.4 oz (99.292 kg)   Body mass index is 40.03 kg/(m^2). Ideal Body Weight: Weight in (lb) to have BMI = 25: 136.4  GEN: WDWN, NAD, Non-toxic, A & O x 3, obese, looks well HEENT: Atraumatic, Normocephalic. Neck supple. No masses, No LAD. Ears and Nose: No external deformity. CV: RRR, No M/G/R. No JVD. No thrill. No extra heart sounds. PULM: CTA B, no wheezes, crackles, rhonchi. No retractions. No resp. distress. No accessory muscle use. ABD: S,  ND, +BS. No rebound. No HSM. Minimal left CVA tenderness. Mild left abdomen tenderness, generalized   EXTR: No c/c/e NEURO Normal gait.  PSYCH: Normally interactive. Conversant. Not depressed or anxious appearing.  Calm demeanor.   Results for orders placed or performed in visit on 03/19/14  Comprehensive metabolic panel  Result Value Ref Range   Sodium 137 135 - 145 mEq/L   Potassium 4.6 3.5 - 5.3 mEq/L   Chloride 99 96 - 112 mEq/L   CO2 29 19 - 32 mEq/L   Glucose, Bld 119 (H) 70 - 99 mg/dL   BUN 13 6 - 23 mg/dL   Creat 1.61 0.96 - 0.45 mg/dL   Total Bilirubin 1.0 0.2 - 1.2 mg/dL   Alkaline Phosphatase 70 39 - 117 U/L   AST 107 (H) 0 - 37 U/L   ALT 107 (H) 0 - 35 U/L   Total Protein 6.6 6.0 - 8.3 g/dL   Albumin 4.0 3.5 - 5.2 g/dL   Calcium 9.3 8.4 - 40.9 mg/dL  POCT urinalysis dipstick  Result Value Ref Range   Color, UA yellow    Clarity, UA clear    Glucose, UA neg    Bilirubin, UA neg    Ketones, UA neg    Spec Grav, UA <=1.005    Blood, UA large    pH, UA 5.5    Protein, UA neg    Urobilinogen, UA 0.2    Nitrite, UA neg    Leukocytes, UA moderate (2+)   POCT UA - Microscopic Only  Result Value Ref Range   WBC, Ur, HPF, POC 5-15    RBC, urine, microscopic 4-10    Bacteria, U Microscopic small    Mucus, UA neg     Epithelial cells, urine per micros 1-3    Crystals, Ur, HPF, POC neg    Casts, Ur, LPF, POC neg    Yeast,  UA neg    Renal tubular cells    POCT CBC  Result Value Ref Range   WBC 9.9 4.6 - 10.2 K/uL   Lymph, poc 2.5 0.6 - 3.4   POC LYMPH PERCENT 25.6 10 - 50 %L   MID (cbc) 0.7 0 - 0.9   POC MID % 6.9 0 - 12 %M   POC Granulocyte 6.7 2 - 6.9   Granulocyte percent 67.5 37 - 80 %G   RBC 5.38 4.04 - 5.48 M/uL   Hemoglobin 14.7 12.2 - 16.2 g/dL   HCT, POC 16.147.2 09.637.7 - 47.9 %   MCV 87.7 80 - 97 fL   MCH, POC 27.3 27 - 31.2 pg   MCHC 31.1 (A) 31.8 - 35.4 g/dL   RDW, POC 04.516.0 %   Platelet Count, POC 218 142 - 424 K/uL   MPV 6.9 0 - 99.8 fL     CLINICAL DATA: Left abdomen and pelvic pain for 1 day. Nausea and vomiting. No fever. History of renal stones scratch head history of stones and lithotripsy.  EXAM: CT ABDOMEN AND PELVIS WITHOUT CONTRAST  TECHNIQUE: Multidetector CT imaging of the abdomen and pelvis was performed following the standard protocol without IV contrast.  COMPARISON: 02/25/2011  FINDINGS: Lung bases are clear. No effusions. Heart is normal size.  There is mild left hydronephrosis and hydroureter. 4 mm distal left ureteral stone along with and elongated 10 x 5 stone at the left ureterovesical junction. Urinary bladder is decompressed. No hydronephrosis on the right. Small bilateral nonobstructing renal stones in the lower poles.  Liver, spleen, pancreas, left adrenal are unremarkable. Small low-density nodule in the right adrenal gland is compatible with adenoma.  Descending colonic and sigmoid diverticulosis. No active diverticulitis. Stomach and small bowel are unremarkable. No free fluid, free air or adenopathy. Aorta and iliac vessels are normal caliber.  No acute bony abnormality or focal bone lesion.  IMPRESSION: Two distal left ureteral stones, the largest is at the left UVJ measuring 10 x 5 mm. Mild left hydronephrosis and  hydroureter.       Assessment and Plan: Flank pain - Plan: POCT urinalysis dipstick, POCT UA - Microscopic Only, Urine culture, tamsulosin (FLOMAX) 0.4 MG CAPS capsule, HYDROcodone-acetaminophen (NORCO/VICODIN) 5-325 MG per tablet, ranitidine (ZANTAC) 150 MG tablet  History of nephrolithiasis  LLQ pain - Plan: POCT CBC, Comprehensive metabolic panel, CT Abdomen Pelvis Wo Contrast  CT as above for suspected kidney stones. Called her to discuss.  Large stone will likely not pass on it's own.  She will take flomax once daily, and I refilled her hydrocodone and zantac rx.   [facility] Beverely PaceAlbertson if possible.  I will try to set this up for her, await CMP.    Signed Abbe AmsterdamJessica Vernice Mannina, MD  12/29.  Received her CMP overnight- renal function is ok but LFTs are up.  Suspect due to fatty liver, do not have any old levels available except for a normal level on "care everywhere" from 2009.   Called [facility]- they are able to see her this am at [place] location.  Called pt and let her know she will try to get there asap but will call Pam to discuss when to arrive.  Called and asked [facility] staff to fax records.  Also let Claris CheMargaret know that her LFTs are up. Will plan for an ultrasound and to repeat her LFTs in a week or so

## 2014-03-19 NOTE — Patient Instructions (Signed)
Go to [facility] today 03/19/14 at [address] For CT Abdomen/Pelvis without contrast Rule out Kidney [employer]

## 2014-03-20 ENCOUNTER — Telehealth: Payer: Self-pay

## 2014-03-20 NOTE — Telephone Encounter (Signed)
Records faxed to urologist per Dr. Cyndie Chimeopland's request (office note from yesterday including labs and CT).

## 2014-03-21 LAB — URINE CULTURE
Colony Count: NO GROWTH
Organism ID, Bacteria: NO GROWTH

## 2014-03-24 ENCOUNTER — Telehealth: Payer: Self-pay | Admitting: *Deleted

## 2014-03-24 NOTE — Telephone Encounter (Signed)
Called the patient to inform her that her urine culture came back negative for any infection. She stated that she passed her kidney stone earlier in the morning and that she has an appointment with Dr. Beverely Pace on April 16, 2014.

## 2014-03-26 ENCOUNTER — Encounter: Payer: Self-pay | Admitting: Family Medicine

## 2014-04-06 ENCOUNTER — Ambulatory Visit: Payer: Medicare Other | Admitting: Family Medicine

## 2014-04-11 ENCOUNTER — Ambulatory Visit (INDEPENDENT_AMBULATORY_CARE_PROVIDER_SITE_OTHER): Payer: 59 | Admitting: Family Medicine

## 2014-04-11 VITALS — BP 122/68 | HR 65 | Temp 98.5°F | Resp 18 | Wt 215.6 lb

## 2014-04-11 DIAGNOSIS — N3000 Acute cystitis without hematuria: Secondary | ICD-10-CM

## 2014-04-11 DIAGNOSIS — R300 Dysuria: Secondary | ICD-10-CM

## 2014-04-11 LAB — POCT UA - MICROSCOPIC ONLY
Casts, Ur, LPF, POC: NEGATIVE
Crystals, Ur, HPF, POC: NEGATIVE
MUCUS UA: NEGATIVE
Yeast, UA: NEGATIVE

## 2014-04-11 LAB — POCT URINALYSIS DIPSTICK
Bilirubin, UA: NEGATIVE
Glucose, UA: NEGATIVE
Ketones, UA: NEGATIVE
NITRITE UA: NEGATIVE
PH UA: 5.5
PROTEIN UA: NEGATIVE
Spec Grav, UA: <=1.005
Urobilinogen, UA: 0.2

## 2014-04-11 MED ORDER — SULFAMETHOXAZOLE-TRIMETHOPRIM 800-160 MG PO TABS: 1.0000 | ORAL_TABLET | Freq: Two times a day (BID) | ORAL | Status: DC

## 2014-04-11 NOTE — Patient Instructions (Signed)
I will send your urine for a culture and will let you know if there is any problem with the antibiotic that you are taking.   Let me know if you are not feeling better in the next couple of days Use the septra as directed for 7- 10 days

## 2014-04-11 NOTE — Progress Notes (Addendum)
[facility] [address], [place] [address] [phone]  Date:  04/11/2014   Name:  Susan Boone   DOB:  [birth year]   MRN:  [id number]  PCP:  No PCP Per Patient    Chief Complaint: Dysuria   History of Present Illness:  Susan Boone is a 80 y.o. very pleasant female patient who presents with the following:  She is here today with a possible UTI.  She was noted to have a stone at her last visit 12/28- she passed the stone on her own and has follow-up with urology on Monday.    She has noted her current sx for 3 days- she noted mild urinary discomfort at first, and then started drinking plenty of fluids to try and "flush everything out."  She seemed to get better, but she will have worse sx in the am again.  She has noted urinary frequency and dysuria.  No blood in her urine.  She is taking pyruidium OTC as needed.  Last dose yesterday.  No flank pain, no belly pain, no fever.  No vaginal sx  Patient Active Problem List   Diagnosis Date Noted  . Recurrent UTI 07/07/2013  . UMBILICAL HERNIA 10/20/2007  . IRRITABLE BOWEL SYNDROME 10/20/2007  . RENAL CYST, LEFT 10/20/2007  . ARTHRITIS 10/20/2007  . DYSPHAGIA UNSPECIFIED 10/20/2007  . DYSPHAGIA 10/20/2007  . DIARRHEA 10/20/2007  . ANEMIA, HX OF 10/20/2007  . NEPHROLITHIASIS, HX OF 10/20/2007    Past Medical History  Diagnosis Date  . Migraine   . H/O orthostatic hypotension   . Esophageal reflux   . Allergy   . Arthritis   . Kidney stones     Past Surgical History  Procedure Laterality Date  . Tubal ligation    . Appendectomy    . Lithotripsy    . Cholecystectomy      History  Substance Use Topics  . Smoking status: Never Smoker   . Smokeless tobacco: Not on file  . Alcohol Use: No    Family History  Problem Relation Age of Onset  . Stroke Father   . Hyperlipidemia Father   . Lung cancer Mother     Allergies  Allergen Reactions  . Codeine   . Doxycycline      Medication list has been reviewed and updated.  Current Outpatient Prescriptions on File Prior to Visit  Medication Sig Dispense Refill  . nadolol (CORGARD) 80 MG tablet Take 80 mg by mouth daily.    . phenazopyridine (PYRIDIUM) 200 MG tablet Take 200 mg by mouth 3 (three) times daily as needed for pain.    . potassium citrate (UROCIT-K) 10 MEQ (1080 MG) SR tablet Take 10 mEq by mouth 3 (three) times daily with meals.    Marland Kitchen. HYDROcodone-acetaminophen (NORCO/VICODIN) 5-325 MG per tablet Take 1 tablet by mouth every 6 (six) hours as needed for moderate pain. (Patient not taking: Reported on 04/11/2014) 20 tablet 0  . hyoscyamine (HYOMAX) 0.125 MG tablet Take 0.125 mg by mouth every 4 (four) hours as needed.    . ranitidine (ZANTAC) 150 MG tablet Take 1 tablet (150 mg total) by mouth 2 (two) times daily. Use as needed for GI upset (Patient not taking: Reported on 04/11/2014) 60 tablet 1  . tamsulosin (FLOMAX) 0.4 MG CAPS capsule Take 1 capsule (0.4 mg total) by mouth daily. Use as needed for kidney stone (Patient not taking: Reported on 04/11/2014) 30 capsule 3   No current facility-administered  medications on file prior to visit.    Review of Systems:  As per HPI- otherwise negative.   Physical Examination: Filed Vitals:   04/11/14 1235  BP: 122/68  Pulse: 65  Temp: 98.5 F (36.9 C)  Resp: 18   Filed Vitals:   04/11/14 1235  Weight: 215 lb 9.6 oz (97.796 kg)   Body mass index is 39.42 kg/(m^2). Ideal Body Weight:    GEN: WDWN, NAD, Non-toxic, A & O x 3, obese, looks well  HEENT: Atraumatic, Normocephalic. Neck supple. No masses, No LAD. Ears and Nose: No external deformity. CV: RRR, No M/G/R. No JVD. No thrill. No extra heart sounds. PULM: CTA B, no wheezes, crackles, rhonchi. No retractions. No resp. distress. No accessory muscle use. ABD: S, NT, ND. No rebound. No HSM.  benign EXTR: No c/c/e NEURO Normal gait.  PSYCH: Normally interactive. Conversant. Not depressed or  anxious appearing.  Calm demeanor.  No CVA tenderness  Results for orders placed or performed in visit on 04/11/14  POCT urinalysis dipstick  Result Value Ref Range   Color, UA yellow    Clarity, UA hazy    Glucose, UA neg    Bilirubin, UA neg    Ketones, UA neg    Spec Grav, UA <=1.005    Blood, UA trace-itnact    pH, UA 5.5    Protein, UA neg    Urobilinogen, UA 0.2    Nitrite, UA neg    Leukocytes, UA large (3+)   POCT UA - Microscopic Only  Result Value Ref Range   WBC, Ur, HPF, POC 30-40    RBC, urine, microscopic 1-3    Bacteria, U Microscopic 1+    Mucus, UA neg    Epithelial cells, urine per micros 2-5    Crystals, Ur, HPF, POC neg    Casts, Ur, LPF, POC neg    Yeast, UA neg    Renal tubular cells      Creat clearance = 45, ok for full dose of septra  Assessment and Plan: Acute cystitis without hematuria - Plan: sulfamethoxazole-trimethoprim (BACTRIM DS,SEPTRA DS) 800-160 MG per tablet  Dysuria - Plan: POCT urinalysis dipstick, POCT UA - Microscopic Only, Urine culture, sulfamethoxazole-trimethoprim (BACTRIM DS,SEPTRA DS) 800-160 MG per tablet  Treat for UTI with septra twice a day for 7- 10 days.  She will seek care if not getting better- Sooner if worse.   Follow- up with urology next week is already planned   Signed Abbe Amsterdam, MD  Called 1/23: urine culture showed klebsiella sensitive to septra.  LMOM with this info, call if not feeling better

## 2014-04-14 LAB — URINE CULTURE

## 2014-04-23 ENCOUNTER — Ambulatory Visit (INDEPENDENT_AMBULATORY_CARE_PROVIDER_SITE_OTHER): Payer: 59 | Admitting: Family Medicine

## 2014-04-23 ENCOUNTER — Encounter: Payer: Self-pay | Admitting: Family Medicine

## 2014-04-23 VITALS — BP 142/64 | HR 66 | Temp 98.2°F | Resp 16 | Ht 62.5 in | Wt 213.2 lb

## 2014-04-23 DIAGNOSIS — K3189 Other diseases of stomach and duodenum: Secondary | ICD-10-CM

## 2014-04-23 DIAGNOSIS — Z87442 Personal history of urinary calculi: Secondary | ICD-10-CM

## 2014-04-23 DIAGNOSIS — E559 Vitamin D deficiency, unspecified: Secondary | ICD-10-CM

## 2014-04-23 DIAGNOSIS — R945 Abnormal results of liver function studies: Principal | ICD-10-CM

## 2014-04-23 DIAGNOSIS — Z131 Encounter for screening for diabetes mellitus: Secondary | ICD-10-CM

## 2014-04-23 DIAGNOSIS — Z1321 Encounter for screening for nutritional disorder: Secondary | ICD-10-CM

## 2014-04-23 DIAGNOSIS — R7989 Other specified abnormal findings of blood chemistry: Secondary | ICD-10-CM

## 2014-04-23 DIAGNOSIS — K319 Disease of stomach and duodenum, unspecified: Secondary | ICD-10-CM

## 2014-04-23 LAB — COMPREHENSIVE METABOLIC PANEL
ALBUMIN: 4.1 g/dL (ref 3.5–5.2)
ALT: 18 U/L (ref 0–35)
AST: 20 U/L (ref 0–37)
Alkaline Phosphatase: 60 U/L (ref 39–117)
BUN: 9 mg/dL (ref 6–23)
CHLORIDE: 103 meq/L (ref 96–112)
CO2: 28 mEq/L (ref 19–32)
Calcium: 9 mg/dL (ref 8.4–10.5)
Creat: 0.68 mg/dL (ref 0.50–1.10)
Glucose, Bld: 90 mg/dL (ref 70–99)
POTASSIUM: 4.4 meq/L (ref 3.5–5.3)
SODIUM: 140 meq/L (ref 135–145)
TOTAL PROTEIN: 6.6 g/dL (ref 6.0–8.3)
Total Bilirubin: 0.5 mg/dL (ref 0.2–1.2)

## 2014-04-23 LAB — HEMOGLOBIN A1C
Hgb A1c MFr Bld: 5.8 % — ABNORMAL HIGH (ref <5.7)
Mean Plasma Glucose: 120 mg/dL — ABNORMAL HIGH (ref <117)

## 2014-04-23 MED ORDER — HYOSCYAMINE SULFATE 0.125 MG PO TABS: 0.1250 mg | ORAL_TABLET | ORAL | Status: DC | PRN

## 2014-04-23 NOTE — Patient Instructions (Addendum)
Let me know if you would like to have a sleep study after all- I am glad to order this for you If your liver function continues to run high we will look further.  I will get in touch with your regarding your labs asap  Ask your urologist about your nighttime urination- perhaps there is something more that can help you

## 2014-04-23 NOTE — Progress Notes (Addendum)
[facility] [address] [place] [address] [phone]  Date:  04/23/2014   Name:  Susan Boone   DOB:  1935-03-08   MRN:  [id number]  PCP:  No PCP Per Patient    Chief Complaint: Follow-up   History of Present Illness:  Susan Boone is a 80 y.o. very pleasant female patient who presents with the following:  Here today to follow-up kidney stones.  She was seen here in December for a kidney stone.  She was then here in January for a UTI.  She had an appt to see urology on 2/22.   She would like to have follow-up for her elevated liver function that we saw in December.  She is not fasting today. No prior history of eleveated liver function She does not drink alcohol.  She had maybe taken a little tylenol but she does not take it often.   She also would like to have a vitamin D level, and an A1c level  She also notes somnolence- she can "fall asleep" at any time.  She does not think that she snores excessively, but notes that she does wake herself up.  She has never been tested for sleep apnea- however she thinks the problem is more that she has to get up and urinate a lot at night  Patient Active Problem List   Diagnosis Date Noted  . Recurrent UTI 07/07/2013  . UMBILICAL HERNIA 10/20/2007  . IRRITABLE BOWEL SYNDROME 10/20/2007  . RENAL CYST, LEFT 10/20/2007  . ARTHRITIS 10/20/2007  . DYSPHAGIA UNSPECIFIED 10/20/2007  . DYSPHAGIA 10/20/2007  . DIARRHEA 10/20/2007  . ANEMIA, HX OF 10/20/2007  . NEPHROLITHIASIS, HX OF 10/20/2007    Past Medical History  Diagnosis Date  . Migraine   . H/O orthostatic hypotension   . Esophageal reflux   . Allergy   . Arthritis   . Kidney stones     Past Surgical History  Procedure Laterality Date  . Tubal ligation    . Appendectomy    . Lithotripsy    . Cholecystectomy      History  Substance Use Topics  . Smoking status: Never Smoker   . Smokeless tobacco: Not on file  . Alcohol Use: No     Family History  Problem Relation Age of Onset  . Stroke Father   . Hyperlipidemia Father   . Lung cancer Mother     Allergies  Allergen Reactions  . Codeine   . Doxycycline     Medication list has been reviewed and updated.  Current Outpatient Prescriptions on File Prior to Visit  Medication Sig Dispense Refill  . nadolol (CORGARD) 80 MG tablet Take 80 mg by mouth daily.    . potassium citrate (UROCIT-K) 10 MEQ (1080 MG) SR tablet Take 10 mEq by mouth 3 (three) times daily with meals.    Marland Kitchen HYDROcodone-acetaminophen (NORCO/VICODIN) 5-325 MG per tablet Take 1 tablet by mouth every 6 (six) hours as needed for moderate pain. (Patient not taking: Reported on 04/11/2014) 20 tablet 0  . hyoscyamine (HYOMAX) 0.125 MG tablet Take 0.125 mg by mouth every 4 (four) hours as needed.    . phenazopyridine (PYRIDIUM) 200 MG tablet Take 200 mg by mouth 3 (three) times daily as needed for pain.    . ranitidine (ZANTAC) 150 MG tablet Take 1 tablet (150 mg total) by mouth 2 (two) times daily. Use as needed for GI upset (Patient not taking: Reported on 04/11/2014) 60  tablet 1  . sulfamethoxazole-trimethoprim (BACTRIM DS,SEPTRA DS) 800-160 MG per tablet Take 1 tablet by mouth 2 (two) times daily. (Patient not taking: Reported on 04/23/2014) 20 tablet 0  . tamsulosin (FLOMAX) 0.4 MG CAPS capsule Take 1 capsule (0.4 mg total) by mouth daily. Use as needed for kidney stone (Patient not taking: Reported on 04/11/2014) 30 capsule 3   No current facility-administered medications on file prior to visit.    Review of Systems:  As per HPI- otherwise negative.   Physical Examination: Filed Vitals:   04/23/14 1131  BP: 142/64  Pulse: 66  Temp: 98.2 F (36.8 C)  Resp: 16   Filed Vitals:   04/23/14 1131  Height: 5' 2.5" (1.588 m)  Weight: 213 lb 3.2 oz (96.707 kg)   Body mass index is 38.35 kg/(m^2). Ideal Body Weight: Weight in (lb) to have BMI = 25: 138.6  GEN: WDWN, NAD, Non-toxic, A & O x 3,  obese, looks well HEENT: Atraumatic, Normocephalic. Neck supple. No masses, No LAD. Ears and Nose: No external deformity. CV: RRR, No M/G/R. No JVD. No thrill. No extra heart sounds. PULM: CTA B, no wheezes, crackles, rhonchi. No retractions. No resp. distress. No accessory muscle use. ABD: S, NT, ND EXTR: No c/c/e NEURO Normal gait.  PSYCH: Normally interactive. Conversant. Not depressed or anxious appearing.  Calm demeanor.    Assessment and Plan: Elevated liver function tests - Plan: Comprehensive metabolic panel, Hepatitis panel, acute  Screening for diabetes mellitus - Plan: Hemoglobin A1c  Encounter for vitamin deficiency screening - Plan: Vitamin D, 25-hydroxy  Await follow-up labs today.  For the time being she declines a sleep study referral See patient instructions for more details.   Will plan further follow- up pending labs.   Signed Abbe AmsterdamJessica Copland, MD  Called her on 2/2 She also needs RF of her potassium citrate that she takes TID, she has been doing this for years.  I will RF for her Liver function is back to normal and hepatitis screen negative She is pre- diabetic and needs to work on weight loss Vitamin D level is

## 2014-04-24 ENCOUNTER — Encounter: Payer: Self-pay | Admitting: Family Medicine

## 2014-04-24 LAB — HEPATITIS PANEL, ACUTE
HCV Ab: NEGATIVE
Hep A IgM: NONREACTIVE
Hep B C IgM: NONREACTIVE
Hepatitis B Surface Ag: NEGATIVE

## 2014-04-24 LAB — VITAMIN D 25 HYDROXY (VIT D DEFICIENCY, FRACTURES): Vit D, 25-Hydroxy: 17 ng/mL — ABNORMAL LOW (ref 30–100)

## 2014-04-24 MED ORDER — VITAMIN D (ERGOCALCIFEROL) 1.25 MG (50000 UNIT) PO CAPS: ORAL_CAPSULE | ORAL | Status: DC

## 2014-04-24 MED ORDER — POTASSIUM CITRATE ER 10 MEQ (1080 MG) PO TBCR: 10.0000 meq | EXTENDED_RELEASE_TABLET | Freq: Three times a day (TID) | ORAL | Status: DC

## 2014-04-24 NOTE — Addendum Note (Signed)
Addended by: Abbe AmsterdamOPLAND, JESSICA C on: 04/24/2014 11:18 AM   Modules accepted: Orders, Medications

## 2014-04-29 ENCOUNTER — Ambulatory Visit (INDEPENDENT_AMBULATORY_CARE_PROVIDER_SITE_OTHER): Payer: Medicare Other | Admitting: Family Medicine

## 2014-04-29 VITALS — BP 120/72 | HR 51 | Temp 98.8°F | Resp 20 | Ht 62.0 in | Wt 212.2 lb

## 2014-04-29 DIAGNOSIS — B9689 Other specified bacterial agents as the cause of diseases classified elsewhere: Secondary | ICD-10-CM

## 2014-04-29 DIAGNOSIS — R300 Dysuria: Secondary | ICD-10-CM

## 2014-04-29 DIAGNOSIS — N3090 Cystitis, unspecified without hematuria: Secondary | ICD-10-CM

## 2014-04-29 DIAGNOSIS — N390 Urinary tract infection, site not specified: Secondary | ICD-10-CM

## 2014-04-29 LAB — POCT URINALYSIS DIPSTICK
BILIRUBIN UA: NEGATIVE
Glucose, UA: NEGATIVE
KETONES UA: NEGATIVE
Nitrite, UA: POSITIVE
Protein, UA: NEGATIVE
RBC UA: NEGATIVE
Spec Grav, UA: 1.025
UROBILINOGEN UA: 0.2
pH, UA: 5.5

## 2014-04-29 LAB — POCT UA - MICROSCOPIC ONLY
Casts, Ur, LPF, POC: NEGATIVE
EPITHELIAL CELLS, URINE PER MICROSCOPY: NEGATIVE
MUCUS UA: NEGATIVE
Yeast, UA: NEGATIVE

## 2014-04-29 MED ORDER — SULFAMETHOXAZOLE-TRIMETHOPRIM 800-160 MG PO TABS: 1.0000 | ORAL_TABLET | Freq: Two times a day (BID) | ORAL | Status: DC

## 2014-04-29 NOTE — Progress Notes (Signed)
Subjective:    Patient ID: Susan Boone, female    DOB: 22-May-1934, 80 y.o.   MRN: [id number] This chart was scribed for Susan Staggers, MD by Jolene Provost, Medical Scribe. This patient was seen in Room 4 and the patient's care was started a 2:45 PM.  Chief Complaint  Patient presents with  . Urinary Tract Infection    recurrent UTI    HPI HPI Comments: Susan Boone is a 80 y.o. female recurrent UTIs, multiple kidney stones, IBS, dysphagia (muscoloskeletal and integument), and arthritis who presents to [facility] complaining of dysuria and urinary frequency that started three days ago. Pt states that Dr. Dallas Schimke told her to stop taking Ceptra if she her sx cleared within seven days. Pt felt better within three days so she stopped taking Ceptra at 7 days. Pt states her current sx recurred four days ago. Pt states she had leftover abx, and started taking them two days ago when she believed she had a UTI. Pt reported to [facility] today when she ran out of her abx. Pt states her sx have improved since she restarted the abx. Pt endorses associated nausea. Pt denies associated vomiting, flank pain, or abdominal pain.   Notes from last HPI's Pt was seen January 20th with dysuria for three days. Pt has a hx of kidney stones, and x-ray in December 2015 showed two left ureteral stones. Pt is followed by [facility]. Pt was seen Jan 20th by Dr. Dallas Schimke with urinary frequency and dysuria, and there was no blood in urine or flank pain, abdominal pain at that time. She had 30-40 WBCs on urine miroalbumen and 1-3 RBCs. Was treated with ceptra DS BID. Urine culture was positive for klebsiella that was sensitive to Septra. Pt has an appointment with urology on the 21st of this month.    Patient Active Problem List   Diagnosis Date Noted  . Recurrent UTI 07/07/2013  . UMBILICAL HERNIA 10/20/2007  . IRRITABLE BOWEL SYNDROME 10/20/2007  . RENAL CYST, LEFT 10/20/2007  . ARTHRITIS 10/20/2007   . DYSPHAGIA UNSPECIFIED 10/20/2007  . DYSPHAGIA 10/20/2007  . DIARRHEA 10/20/2007  . ANEMIA, HX OF 10/20/2007  . NEPHROLITHIASIS, HX OF 10/20/2007   Past Medical History  Diagnosis Date  . Migraine   . H/O orthostatic hypotension   . Esophageal reflux   . Allergy   . Arthritis   . Kidney stones    Past Surgical History  Procedure Laterality Date  . Tubal ligation    . Appendectomy    . Lithotripsy    . Cholecystectomy     Allergies  Allergen Reactions  . Codeine   . Doxycycline    Prior to Admission medications   Medication Sig Start Date End Date Taking? Authorizing Provider  hyoscyamine (LEVSIN, ANASPAZ) 0.125 MG tablet Take 1 tablet (0.125 mg total) by mouth every 4 (four) hours as needed. 04/23/14  Yes Gwenlyn Found Copland, MD  nadolol (CORGARD) 80 MG tablet Take 80 mg by mouth daily.   Yes Historical Provider, MD  phenazopyridine (PYRIDIUM) 200 MG tablet Take 200 mg by mouth 3 (three) times daily as needed for pain.   Yes Historical Provider, MD  potassium citrate (UROCIT-K) 10 MEQ (1080 MG) SR tablet Take 1 tablet (10 mEq total) by mouth 3 (three) times daily with meals. 04/24/14  Yes Gwenlyn Found Copland, MD  Vitamin D, Ergocalciferol, (DRISDOL) 50000 UNITS CAPS capsule Take one a week for 3 months,then change to a daily OTC 1,000- 2,000  unit supplement 04/24/14  Yes Pearline CablesJessica C Copland, MD   History   Social History  . Marital Status: Divorced    Spouse Name: N/A    Number of Children: N/A  . Years of Education: N/A   Occupational History  . Not on file.   Social History Main Topics  . Smoking status: Never Smoker   . Smokeless tobacco: Not on file  . Alcohol Use: No  . Drug Use: No  . Sexual Activity: Not on file   Other Topics Concern  . Not on file   Social History Narrative     Review of Systems  Constitutional: Negative for fever and chills.  Gastrointestinal: Negative for diarrhea and constipation.  Genitourinary: Positive for dysuria, urgency,  frequency, flank pain and pelvic pain.  Musculoskeletal: Negative for myalgias.       Objective:   Physical Exam  Constitutional: She is oriented to person, place, and time. She appears well-developed and well-nourished.  HENT:  Head: Normocephalic and atraumatic.  Eyes: Pupils are equal, round, and reactive to light.  Neck: Neck supple.  Cardiovascular: Normal rate, regular rhythm and normal heart sounds.  Exam reveals no gallop and no friction rub.   No murmur heard. Pulmonary/Chest: Effort normal and breath sounds normal. No respiratory distress. She has no wheezes.  Abdominal: Soft. There is no tenderness.  No CVA tenderness.  Neurological: She is alert and oriented to person, place, and time.  Skin: Skin is warm and dry.  Psychiatric: She has a normal mood and affect. Her behavior is normal.  Nursing note and vitals reviewed.    Filed Vitals:   04/29/14 1420  BP: 120/72  Pulse: 51  Temp: 98.8 F (37.1 C)  TempSrc: Oral  Resp: 20  Height: 5\' 2"  (1.575 m)  Weight: 212 lb 4 oz (96.276 kg)  SpO2: 97%   Results for orders placed or performed in visit on 04/29/14  POCT UA - Microscopic Only  Result Value Ref Range   WBC, Ur, HPF, POC 0-2    RBC, urine, microscopic 0-1    Bacteria, U Microscopic trace    Mucus, UA neg    Epithelial cells, urine per micros neg    Crystals, Ur, HPF, POC calcium oxalate    Casts, Ur, LPF, POC neg    Yeast, UA neg    Renal tubular cells 0-1   POCT urinalysis dipstick  Result Value Ref Range   Color, UA dark orange    Clarity, UA slightly cloudy    Glucose, UA neg    Bilirubin, UA neg    Ketones, UA neg    Spec Grav, UA 1.025    Blood, UA neg    pH, UA 5.5    Protein, UA neg    Urobilinogen, UA 0.2    Nitrite, UA positive    Leukocytes, UA Trace        Assessment & Plan:   Susan ShearsMargaret B Carda is a 80 y.o. female Dysuria - Plan: POCT UA - Microscopic Only, POCT urinalysis dipstick, sulfamethoxazole-trimethoprim (BACTRIM  DS,SEPTRA DS) 800-160 MG per tablet, Urine culture  Klebsiella cystitis - Plan: sulfamethoxazole-trimethoprim (BACTRIM DS,SEPTRA DS) 800-160 MG per tablet, Urine culture  Recurrent urinary tract infection - Plan: sulfamethoxazole-trimethoprim (BACTRIM DS,SEPTRA DS) 800-160 MG per tablet, Urine culture  Suspected UTI that is recurrent or persistent after prior treatment as most recent infection was on January 20th . She did have some improvement then recurrence of sx last week. Urine testing and culture  today may be of limited utility as she has had five doses of Sepra already.   - will treat for possible recurrent infection with total ten day course of Septra, prior Klebsiella sensitive to Septra. check urine culture and RTC precautions. Keep follow up with urology.  Meds ordered this encounter  Medications  . sulfamethoxazole-trimethoprim (BACTRIM DS,SEPTRA DS) 800-160 MG per tablet    Sig: Take 1 tablet by mouth 2 (two) times daily.    Dispense:  15 tablet    Refill:  0   Patient Instructions  As you have already been on antibiotic, your urine test and culture may not be helpful at this time. Continue septra twice per day to complete 10 day course, and if not continuing to improve - return for recheck. Return to the clinic or go to the nearest emergency room if any of your symptoms worsen or new symptoms occur.  Keep follow up with your urologist.     I personally performed the services described in this documentation, which was scribed in my presence. The recorded information has been reviewed and considered, and addended by me as needed.

## 2014-04-29 NOTE — Patient Instructions (Signed)
As you have already been on antibiotic, your urine test and culture may not be helpful at this time. Continue septra twice per day to complete 10 day course, and if not continuing to improve - return for recheck. Return to the clinic or go to the nearest emergency room if any of your symptoms worsen or new symptoms occur.  Keep follow up with your urologist.

## 2014-05-01 LAB — URINE CULTURE

## 2014-06-27 ENCOUNTER — Encounter: Payer: Self-pay | Admitting: *Deleted

## 2014-08-13 ENCOUNTER — Encounter: Payer: Self-pay | Admitting: *Deleted

## 2014-08-29 ENCOUNTER — Telehealth: Payer: Self-pay | Admitting: *Deleted

## 2014-08-29 NOTE — Telephone Encounter (Signed)
Patient phoned in response to my letter & LM that she is going through a lot of "downsizing" in her home right now & does not have time to schedule an appt for AWV at this time, but that she wants us to "keep in touch".  States she will call back at a more convenient time for her to discuss and/or schedule.

## 2014-11-13 ENCOUNTER — Telehealth: Payer: Self-pay | Admitting: *Deleted

## 2014-11-13 NOTE — Telephone Encounter (Signed)
Phoned patient and set up CPE with J. Copland and conversed with patient for over an hour.  She is a retired Engineer, civil (consulting) and we discussed nursing, etc...  Pt was very appreciative of my ability to talk to her (comfortably) and without being made to feel "like a number"... Thoroughly enjoyed our conversation.  Patient also stated she needed to discuss mobility issues with MD during visit.

## 2015-01-14 ENCOUNTER — Ambulatory Visit (INDEPENDENT_AMBULATORY_CARE_PROVIDER_SITE_OTHER): Payer: Medicare Other | Admitting: Family Medicine

## 2015-01-14 VITALS — BP 130/70 | HR 69 | Temp 98.4°F | Resp 18 | Ht 62.0 in | Wt 221.0 lb

## 2015-01-14 DIAGNOSIS — R350 Frequency of micturition: Secondary | ICD-10-CM | POA: Diagnosis not present

## 2015-01-14 DIAGNOSIS — R81 Glycosuria: Secondary | ICD-10-CM | POA: Diagnosis not present

## 2015-01-14 DIAGNOSIS — R300 Dysuria: Secondary | ICD-10-CM

## 2015-01-14 LAB — POCT URINALYSIS DIP (MANUAL ENTRY)
Bilirubin, UA: NEGATIVE
Blood, UA: NEGATIVE
Glucose, UA: 100 — AB
Nitrite, UA: POSITIVE — AB
SPEC GRAV UA: 1.02
UROBILINOGEN UA: 1
pH, UA: 5

## 2015-01-14 LAB — POC MICROSCOPIC URINALYSIS (UMFC): Mucus: ABSENT

## 2015-01-14 LAB — GLUCOSE, POCT (MANUAL RESULT ENTRY): POC Glucose: 133 mg/dl — AB (ref 70–99)

## 2015-01-14 MED ORDER — SULFAMETHOXAZOLE-TRIMETHOPRIM 800-160 MG PO TABS: 1.0000 | ORAL_TABLET | Freq: Two times a day (BID) | ORAL | Status: DC

## 2015-01-14 NOTE — Progress Notes (Signed)
[facility] [address], [place] [id number] [phone]  Date:  01/14/2015   Name:  Susan ShearsMargaret B Boone   DOB:  10/12/1934   MRN:  [id number]  PCP:  Abbe AmsterdamOPLAND,Dewanna Hurston, MD    Chief Complaint: Dysuria and Urinary Frequency   History of Present Illness:  Susan Boone is a 80 y.o. very pleasant female patient who presents with the following:  Here today with concern about UTI- she has noted sx for for the last 2-3 days.   She was very busy at home and did not drink enough water.  She seemed tobe able to partially treat it with drinking more water,but then sx came back more this am  She notes dysuria, frequency She has tried pyridium- this does help  No fever, abd pain, or vomiting She always has some back pain- nothing different here No vaginal sx.       Patient Active Problem List   Diagnosis Date Noted  . Recurrent UTI 07/07/2013  . UMBILICAL HERNIA 10/20/2007  . IRRITABLE BOWEL SYNDROME 10/20/2007  . RENAL CYST, LEFT 10/20/2007  . ARTHRITIS 10/20/2007  . DYSPHAGIA UNSPECIFIED 10/20/2007  . DYSPHAGIA 10/20/2007  . DIARRHEA 10/20/2007  . ANEMIA, HX OF 10/20/2007  . NEPHROLITHIASIS, HX OF 10/20/2007    Past Medical History  Diagnosis Date  . Migraine   . H/O orthostatic hypotension   . Esophageal reflux   . Allergy   . Arthritis   . Kidney stones     Past Surgical History  Procedure Laterality Date  . Tubal ligation    . Appendectomy    . Lithotripsy    . Cholecystectomy      Social History  Substance Use Topics  . Smoking status: Never Smoker   . Smokeless tobacco: None  . Alcohol Use: No    Family History  Problem Relation Age of Onset  . Stroke Father   . Hyperlipidemia Father   . Lung cancer Mother     Allergies  Allergen Reactions  . Codeine   . Doxycycline     Medication list has been reviewed and updated.  Current Outpatient Prescriptions on File Prior to Visit  Medication Sig Dispense Refill  .  nadolol (CORGARD) 80 MG tablet Take 80 mg by mouth daily.    . phenazopyridine (PYRIDIUM) 200 MG tablet Take 200 mg by mouth 3 (three) times daily as needed for pain.    . potassium citrate (UROCIT-K) 10 MEQ (1080 MG) SR tablet Take 1 tablet (10 mEq total) by mouth 3 (three) times daily with meals. 270 tablet 2  . hyoscyamine (LEVSIN, ANASPAZ) 0.125 MG tablet Take 1 tablet (0.125 mg total) by mouth every 4 (four) hours as needed. (Patient not taking: Reported on 01/14/2015) 30 tablet 2  . Vitamin D, Ergocalciferol, (DRISDOL) 50000 UNITS CAPS capsule Take one a week for 3 months,then change to a daily OTC 1,000- 2,000 unit supplement (Patient not taking: Reported on 01/14/2015) 12 capsule 0   No current facility-administered medications on file prior to visit.    Review of Systems:  As per HPI- otherwise negative.    Physical Examination: Filed Vitals:   01/14/15 1644  BP: 152/68  Pulse: 69  Temp: 98.4 F (36.9 C)  Resp: 18   Filed Vitals:   01/14/15 1644  Height: 5\' 2"  (1.575 m)  Weight: 221 lb (100.245 kg)   Body mass index is 40.41 kg/(m^2). Ideal Body Weight: Weight in (lb) to have BMI = 25:  136.4  GEN: WDWN, NAD, Non-toxic, A & O x 3 HEENT: Atraumatic, Normocephalic. Neck supple. No masses, No LAD. Ears and Nose: No external deformity. CV: RRR, No M/G/R. No JVD. No thrill. No extra heart sounds. PULM: CTA B, no wheezes, crackles, rhonchi. No retractions. No resp. distress. No accessory muscle use. ABD: S, NT, ND, +BS. No rebound. No HSM.  abd is benign EXTR: No c/c/e NEURO Normal gait.  PSYCH: Normally interactive. Conversant. Not depressed or anxious appearing.  Calm demeanor.   Results for orders placed or performed in visit on 01/14/15  POCT Microscopic Urinalysis ([facility])  Result Value Ref Range   WBC,UR,HPF,POC Many (A) None WBC/hpf   RBC,UR,HPF,POC Few (A) None RBC/hpf   Bacteria Moderate (A) None, Too numerous to count   Mucus Absent Absent   Epithelial  Cells, UR Per Microscopy Few (A) None, Too numerous to count cells/hpf  POCT urinalysis dipstick  Result Value Ref Range   Color, UA orange (A) yellow   Clarity, UA clear clear   Glucose, UA =100 (A) negative   Bilirubin, UA negative negative   Ketones, POC UA trace (5) (A) negative   Spec Grav, UA 1.020    Blood, UA negative negative   pH, UA 5.0    Protein Ur, POC trace (A) negative   Urobilinogen, UA 1.0    Nitrite, UA Positive (A) Negative   Leukocytes, UA Trace (A) Negative  POCT glucose (manual entry)  Result Value Ref Range   POC Glucose 133 (A) 70 - 99 mg/dl    Assessment and Plan: Dysuria - Plan: POCT Microscopic Urinalysis ([facility]), POCT urinalysis dipstick, Urine culture, sulfamethoxazole-trimethoprim (BACTRIM DS,SEPTRA DS) 800-160 MG tablet  Frequency of urination - Plan: POCT Microscopic Urinalysis ([facility]), POCT urinalysis dipstick, Urine culture  Glycosuria - Plan: POCT glucose (manual entry)  Treat for presumed UTI with septra which has worked for her in the past Await urine culture Glycosuria due to azo effect She will let me know if any change or worsening   Signed Abbe Amsterdam, MD

## 2015-01-14 NOTE — Patient Instructions (Addendum)
I will be in touch with your urine culture asap Let me know if you do not feel better in the next couple of days We will treat you with bactrim for your UTI- I gave you enough for 10 days.  However if you feel fine after a week it is probably ok to stop

## 2015-01-16 LAB — URINE CULTURE

## 2015-01-17 ENCOUNTER — Encounter: Payer: Self-pay | Admitting: Family Medicine

## 2015-02-06 ENCOUNTER — Encounter: Payer: Self-pay | Admitting: Family Medicine

## 2015-02-06 ENCOUNTER — Ambulatory Visit (INDEPENDENT_AMBULATORY_CARE_PROVIDER_SITE_OTHER): Payer: Medicare Other | Admitting: Family Medicine

## 2015-02-06 VITALS — BP 118/70 | HR 65 | Temp 98.8°F | Resp 18 | Ht 62.5 in | Wt 218.0 lb

## 2015-02-06 DIAGNOSIS — Z0000 Encounter for general adult medical examination without abnormal findings: Secondary | ICD-10-CM | POA: Diagnosis not present

## 2015-02-06 DIAGNOSIS — G43809 Other migraine, not intractable, without status migrainosus: Secondary | ICD-10-CM

## 2015-02-06 DIAGNOSIS — Z23 Encounter for immunization: Secondary | ICD-10-CM | POA: Diagnosis not present

## 2015-02-06 DIAGNOSIS — R197 Diarrhea, unspecified: Secondary | ICD-10-CM

## 2015-02-06 DIAGNOSIS — L989 Disorder of the skin and subcutaneous tissue, unspecified: Secondary | ICD-10-CM

## 2015-02-06 DIAGNOSIS — Z13220 Encounter for screening for lipoid disorders: Secondary | ICD-10-CM

## 2015-02-06 DIAGNOSIS — Z131 Encounter for screening for diabetes mellitus: Secondary | ICD-10-CM

## 2015-02-06 DIAGNOSIS — R5382 Chronic fatigue, unspecified: Secondary | ICD-10-CM | POA: Diagnosis not present

## 2015-02-06 LAB — COMPREHENSIVE METABOLIC PANEL
ALBUMIN: 3.9 g/dL (ref 3.6–5.1)
ALK PHOS: 49 U/L (ref 33–130)
ALT: 13 U/L (ref 6–29)
AST: 18 U/L (ref 10–35)
BUN: 12 mg/dL (ref 7–25)
CALCIUM: 8.9 mg/dL (ref 8.6–10.4)
CO2: 28 mmol/L (ref 20–31)
Chloride: 101 mmol/L (ref 98–110)
Creat: 0.69 mg/dL (ref 0.60–0.93)
Glucose, Bld: 88 mg/dL (ref 65–99)
POTASSIUM: 4.3 mmol/L (ref 3.5–5.3)
Sodium: 141 mmol/L (ref 135–146)
Total Bilirubin: 0.6 mg/dL (ref 0.2–1.2)
Total Protein: 6.5 g/dL (ref 6.1–8.1)

## 2015-02-06 LAB — LIPID PANEL
CHOLESTEROL: 199 mg/dL (ref 125–200)
HDL: 40 mg/dL — AB (ref >=46)
LDL Cholesterol: 120 mg/dL (ref <130)
TRIGLYCERIDES: 194 mg/dL — AB (ref <150)
Total CHOL/HDL Ratio: 5 Ratio (ref <=5.0)
VLDL: 39 mg/dL — ABNORMAL HIGH (ref <30)

## 2015-02-06 MED ORDER — NADOLOL 80 MG PO TABS: 80.0000 mg | ORAL_TABLET | Freq: Every day | ORAL | Status: DC

## 2015-02-06 NOTE — Progress Notes (Signed)
[facility] [address], [place] [address] [phone]  Date:  02/06/2015   Name:  Susan Boone   DOB:  December 18, 1934   MRN:  [id number]  PCP:  Abbe Amsterdam, MD    Chief Complaint: Annual Exam   History of Present Illness:  Susan Boone is a 80 y.o. very pleasant female patient who presents with the following:  Here today for a CPE today- she has fasting labs this am.  She did get better from her UTI last month  Pre-diabetes noted in February of this year with A1c of 5.8%.   She has taken abx quite a bit over the last few years- she has been on abx a lot especially when she had kidney surgery in 2013. She wonders if this could be why she has diarrhea- she has had it off and on since October now.  It is not severe but is persistent  Dexa scan is UTD Last mammo 2 years ago- she plans to do this soon, she has been contacted by GI to have this updated  She is on nadolol for migraine HA.  However she is not aware of any history of HTN- she wonders if she might be able to come off of this medication or even just decrease her dose as she has felt tired   Patient Active Problem List   Diagnosis Date Noted  . Recurrent UTI 07/07/2013  . UMBILICAL HERNIA 10/20/2007  . IRRITABLE BOWEL SYNDROME 10/20/2007  . RENAL CYST, LEFT 10/20/2007  . ARTHRITIS 10/20/2007  . DYSPHAGIA UNSPECIFIED 10/20/2007  . DYSPHAGIA 10/20/2007  . DIARRHEA 10/20/2007  . ANEMIA, HX OF 10/20/2007  . NEPHROLITHIASIS, HX OF 10/20/2007    Past Medical History  Diagnosis Date  . Migraine   . H/O orthostatic hypotension   . Esophageal reflux   . Allergy   . Arthritis   . Kidney stones   . Anemia   . Asthma     Past Surgical History  Procedure Laterality Date  . Tubal ligation    . Appendectomy    . Lithotripsy    . Cholecystectomy    . Cosmetic surgery    . Breast surgery      Social History  Substance Use Topics  . Smoking status: Never Smoker   . Smokeless  tobacco: None  . Alcohol Use: No    Family History  Problem Relation Age of Onset  . Stroke Father   . Hyperlipidemia Father   . Lung cancer Mother   . Cancer Maternal Grandmother     Allergies  Allergen Reactions  . Codeine   . Doxycycline     Medication list has been reviewed and updated.  Current Outpatient Prescriptions on File Prior to Visit  Medication Sig Dispense Refill  . nadolol (CORGARD) 80 MG tablet Take 80 mg by mouth daily.    . hyoscyamine (LEVSIN, ANASPAZ) 0.125 MG tablet Take 1 tablet (0.125 mg total) by mouth every 4 (four) hours as needed. (Patient not taking: Reported on 01/14/2015) 30 tablet 2  . phenazopyridine (PYRIDIUM) 200 MG tablet Take 200 mg by mouth 3 (three) times daily as needed for pain.    . potassium citrate (UROCIT-K) 10 MEQ (1080 MG) SR tablet Take 1 tablet (10 mEq total) by mouth 3 (three) times daily with meals. (Patient not taking: Reported on 02/06/2015) 270 tablet 2  . Vitamin D, Ergocalciferol, (DRISDOL) 50000 UNITS CAPS capsule Take one a week for 3 months,then change  to a daily OTC 1,000- 2,000 unit supplement (Patient not taking: Reported on 01/14/2015) 12 capsule 0   No current facility-administered medications on file prior to visit.    Review of Systems:  As per HPI- otherwise negative.   Physical Examination: Filed Vitals:   02/06/15 1502  BP: 118/70  Pulse:   Temp:   Resp:    Filed Vitals:   02/06/15 1442  Height: 5' 2.5" (1.588 m)  Weight: 218 lb (98.884 kg)   Body mass index is 39.21 kg/(m^2). Ideal Body Weight: Weight in (lb) to have BMI = 25: 138.6  GEN: WDWN, NAD, Non-toxic, A & O x 3, overweight, looks well HEENT: Atraumatic, Normocephalic. Neck supple. No masses, No LAD.  Bilateral TM wnl, oropharynx normal.  PEERL,EOMI.   Ears and Nose: No external deformity. CV: RRR, No M/G/R. No JVD. No thrill. No extra heart sounds. PULM: CTA B, no wheezes, crackles, rhonchi. No retractions. No resp. distress. No  accessory muscle use. ABD: S, NT, ND. No rebound. No HSM. EXTR: No c/c/e NEURO Normal gait.  PSYCH: Normally interactive. Conversant. Not depressed or anxious appearing.  Calm demeanor.  She has spider veins and mild edema of her BLE.  She reports using compression socks for this usualy   Assessment and Plan: Physical exam  Diarrhea, unspecified type - Plan: Clostridium difficile EIA  Immunization due - Plan: Pneumococcal conjugate vaccine 13-valent IM  Screening for hyperlipidemia - Plan: Lipid panel  Chronic fatigue - Plan: TSH  Screening for diabetes mellitus - Plan: Comprehensive metabolic panel  Other migraine without status migrainosus, not intractable - Plan: nadolol (CORGARD) 80 MG tablet  Flu shot and prevnar today Will check for c diff given history of abx use Check her labs as above and will be in touch with her She plans to start splitting her nadolol pills in half and will let me know how this goes for her   BP Readings from Last 3 Encounters:  02/06/15 118/70  01/14/15 130/70  04/29/14 120/72     Signed Abbe AmsterdamJessica Latrise Bowland, MD

## 2015-02-06 NOTE — Patient Instructions (Addendum)
It was great to see you today I will be in touch with your labs.  You might try a probiotic for your diarrhea.  There are refrigerated and non- refrigerated types that you can experiment with  Try cutting your nadolol in half- this may help with your fatigue

## 2015-02-07 ENCOUNTER — Encounter: Payer: Self-pay | Admitting: Family Medicine

## 2015-02-07 LAB — TSH: TSH: 2.064 u[IU]/mL (ref 0.350–4.500)

## 2015-02-26 ENCOUNTER — Other Ambulatory Visit: Payer: Self-pay | Admitting: Family Medicine

## 2015-02-26 ENCOUNTER — Other Ambulatory Visit: Payer: Self-pay | Admitting: *Deleted

## 2015-02-26 DIAGNOSIS — R197 Diarrhea, unspecified: Secondary | ICD-10-CM

## 2015-02-27 LAB — CLOSTRIDIUM DIFFICILE BY PCR: CDIFFPCR: NOT DETECTED

## 2015-04-12 ENCOUNTER — Encounter: Payer: Self-pay | Admitting: Family Medicine

## 2015-04-17 ENCOUNTER — Encounter: Payer: Self-pay | Admitting: Family Medicine

## 2015-04-25 IMAGING — US US RENAL
1 series · 13 of 25 positions shown · non-contrast
Comparison: Abdominal radiograph -05/02/2011; CT abdomen pelvis -
02/25/2011 next few

CLINICAL DATA: Recurrent UTIs.  Staghorn calculus.

EXAM:
RENAL/URINARY TRACT ULTRASOUND COMPLETE

[Series 1: us renal · 0.32mm/px · 13 of 44 slices shown]
[im 1/44]
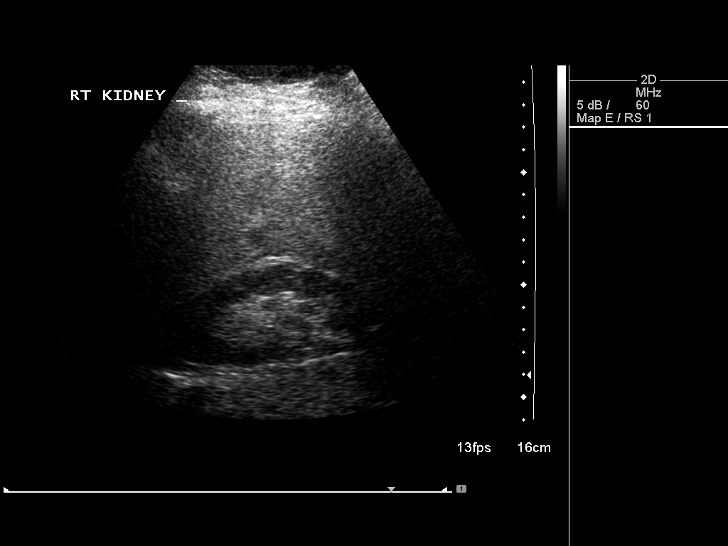
[im 4/44]
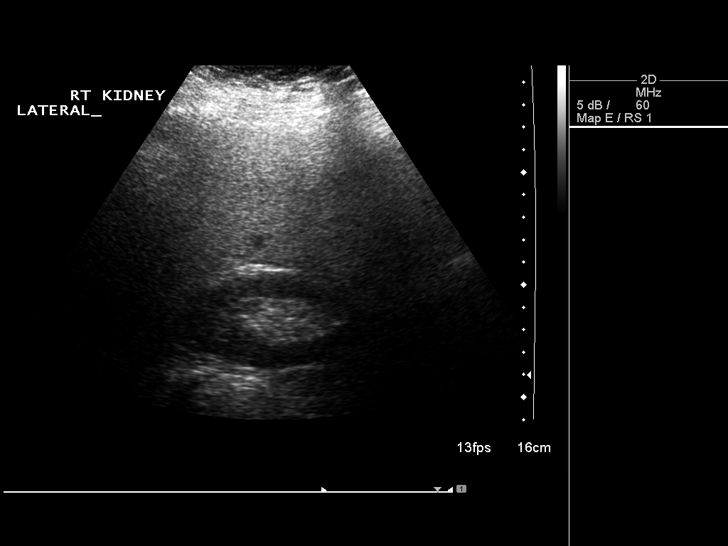
[im 8/44]
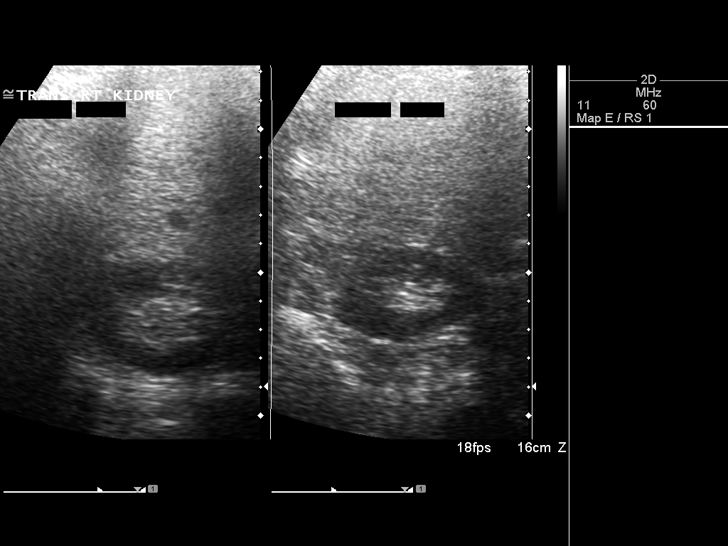
[im 11/44]
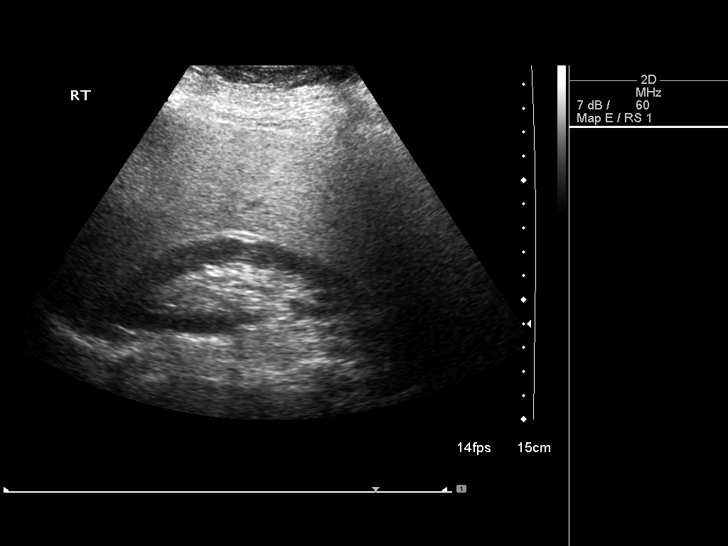
[im 15/44]
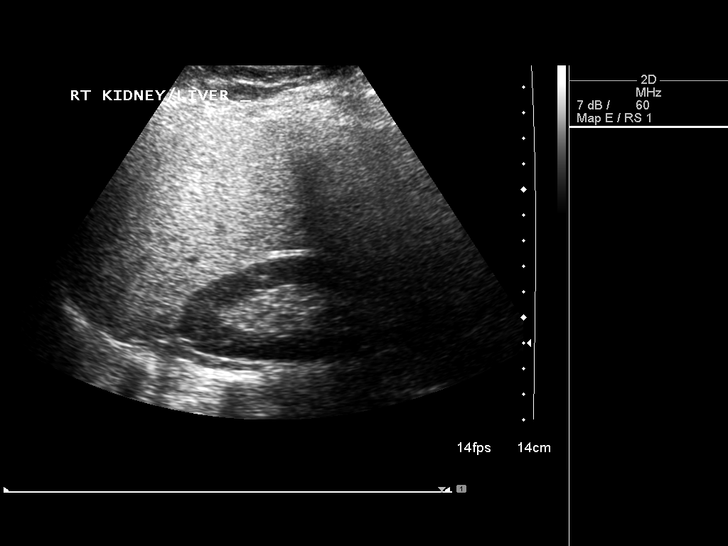
[im 18/44]
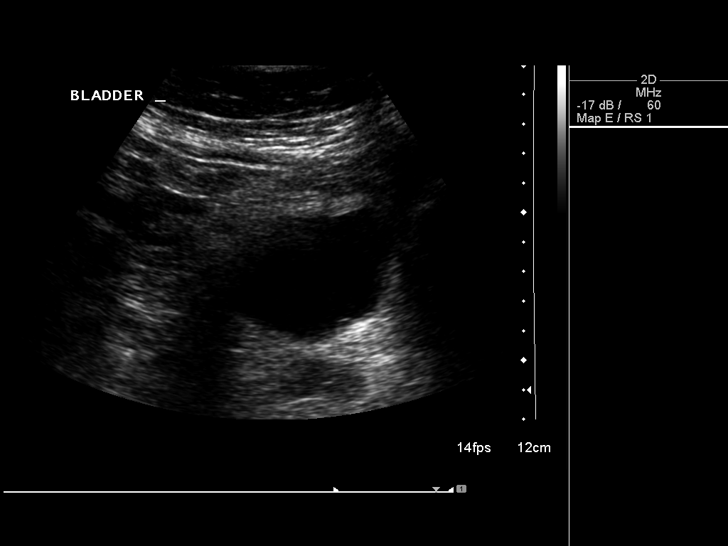
[im 22/44]
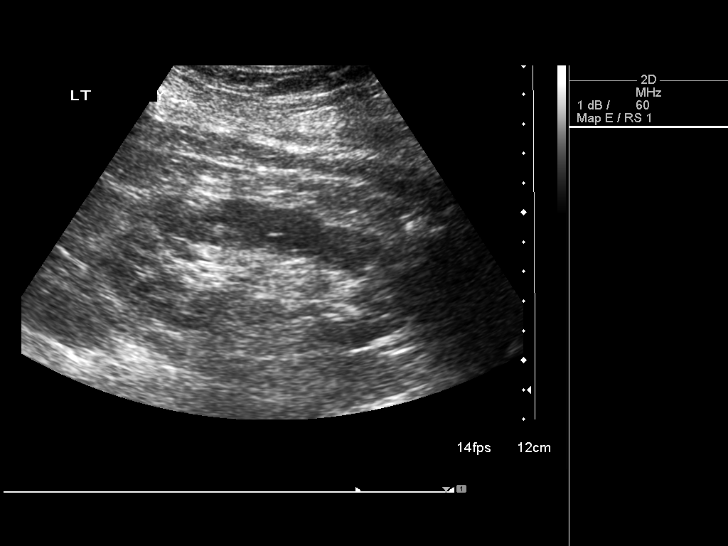
[im 26/44]
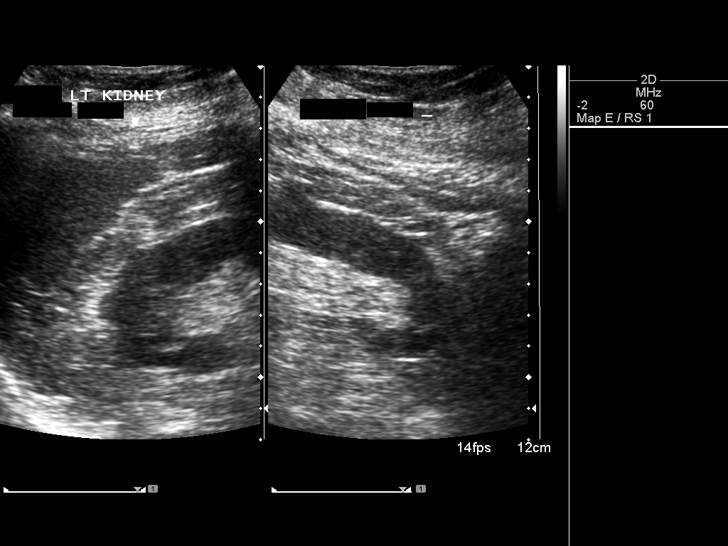
[im 29/44]
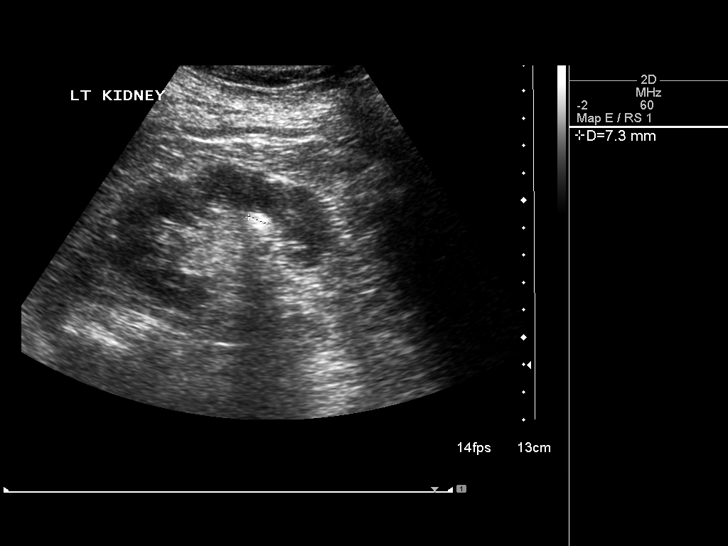
[im 33/44]
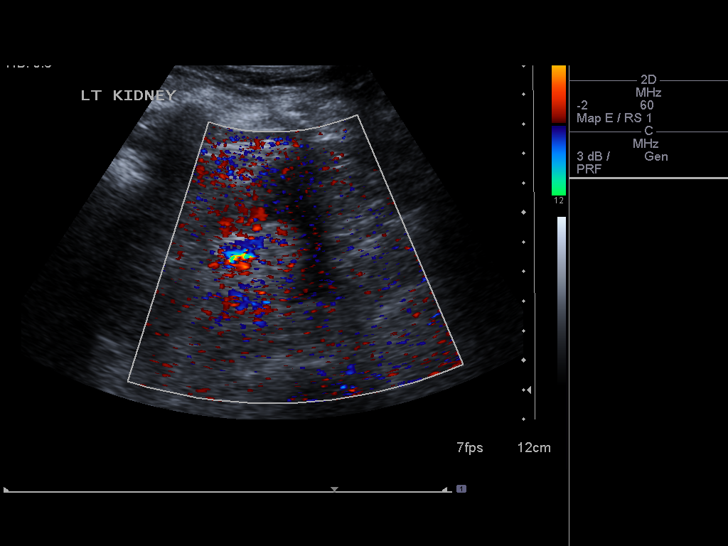
[im 36/44]
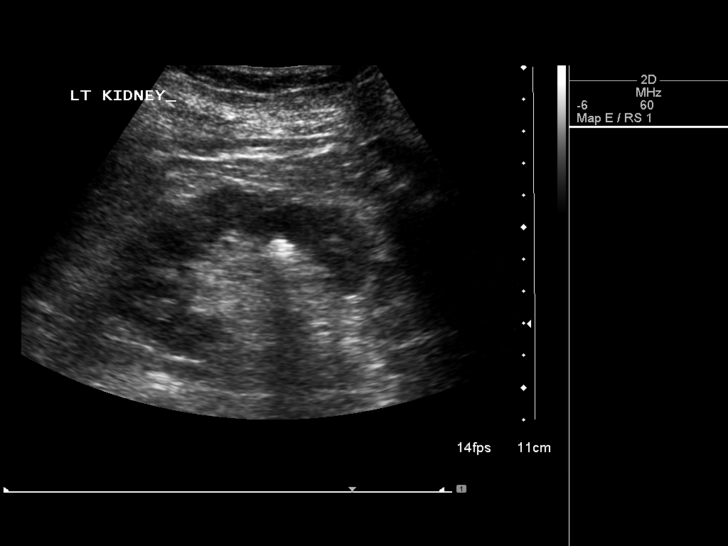
[im 40/44]
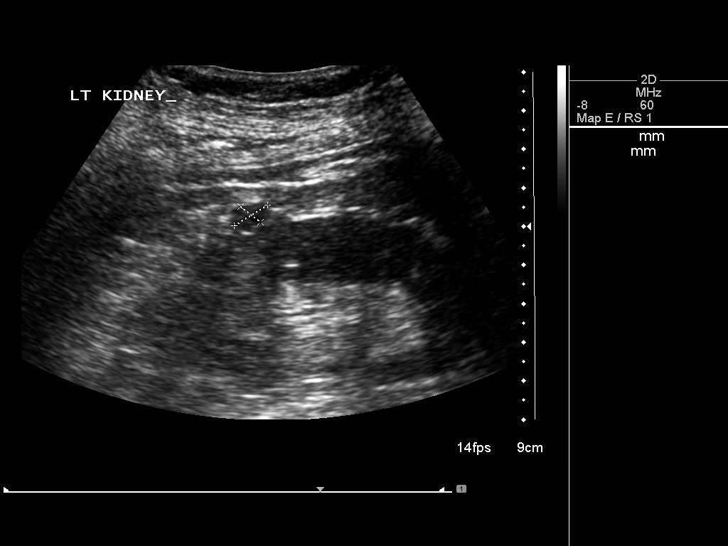
[im 44/44]
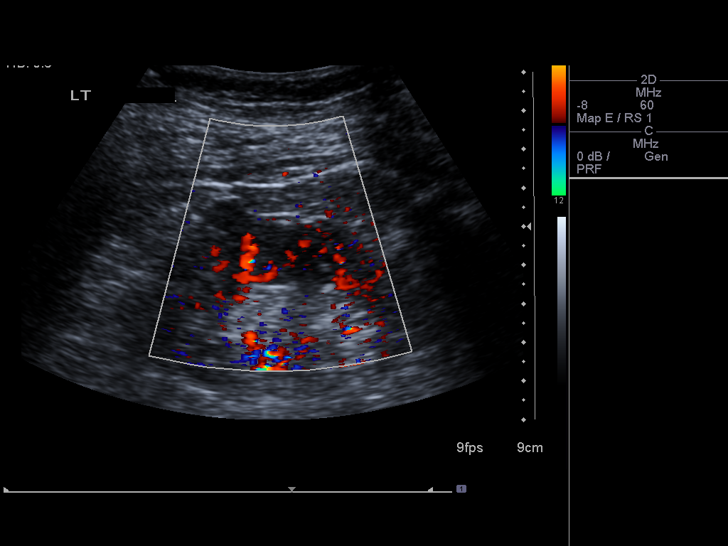

[13 of 25 positions shown; findings below may reference images not displayed]

FINDINGS: Right Kidney:

Evaluation of the right kidney is minimally degraded secondary to
patient body habitus and sonographic window. Normal cortical
thickness, echogenicity and size, measuring 10.7 cm in length. No
focal renal lesions. No definite echogenic renal stones. No urinary
obstruction.

Left Kidney:

Normal cortical thickness, echogenicity and size, measuring 10.9 cm
in length. Note is made of mild fetal lobulation of the left kidney,
similar to remote abdominal CT. Note is made of an approximately
x 0.7 x 0.9 cm partially exophytic anechoic lesion arising from the
superior/ mid pole of the left kidney which is too small to
adequately characterize of favored to represent a renal cyst in
appears morphologically similar to remote abdominal CT. Note is made
of an approximately 0.7 x 0.6 cm echogenic shadowing stone within
the mid/inferior pole the left kidney (images 30 and 36). No urinary
obstruction.

Bladder:

Normal given degree distention.
IMPRESSION: 1. Suspected left-sided nonobstructing nephrolithiasis. Further
evaluation could be performed with repeat noncontrast abdominal CT
as clinically indicated.
2. No definite evidence of right-sided nephrolithiasis though
evaluation somewhat degraded secondary to patient body habitus and
poor sonographic window.

## 2015-05-17 ENCOUNTER — Ambulatory Visit (INDEPENDENT_AMBULATORY_CARE_PROVIDER_SITE_OTHER): Payer: Medicare Other | Admitting: Urgent Care

## 2015-05-17 VITALS — BP 114/76 | HR 64 | Temp 98.8°F | Resp 18 | Wt 211.6 lb

## 2015-05-17 DIAGNOSIS — R300 Dysuria: Secondary | ICD-10-CM

## 2015-05-17 DIAGNOSIS — N3090 Cystitis, unspecified without hematuria: Secondary | ICD-10-CM

## 2015-05-17 LAB — POC MICROSCOPIC URINALYSIS (UMFC): MUCUS RE: ABSENT

## 2015-05-17 LAB — POCT URINALYSIS DIP (MANUAL ENTRY)
BILIRUBIN UA: NEGATIVE
BILIRUBIN UA: NEGATIVE
Glucose, UA: NEGATIVE
Nitrite, UA: NEGATIVE
PH UA: 5.5
Protein Ur, POC: 30 — AB
SPEC GRAV UA: 1.02
Urobilinogen, UA: 0.2

## 2015-05-17 MED ORDER — SULFAMETHOXAZOLE-TRIMETHOPRIM 800-160 MG PO TABS: 1.0000 | ORAL_TABLET | Freq: Two times a day (BID) | ORAL | Status: DC

## 2015-05-17 NOTE — Progress Notes (Signed)
    MRN: [id number] DOB: 1934/12/10  Subjective:   Susan Boone is a 81 y.o. female presenting for chief complaint of Urinary Tract Infection  Reports longstanding history of renal stones and UTIs. Last UTI was in 12/2014. Patient had a surgical procedure to remove a kidney stone in 2013 and she is worried that a portion of the kidney stone was not removed and is causing her recurrent UTIs since then. Her nephrologist is Dr. Sharen Hones. Also, sees Dr. Beverely Pace, her urologist. Today, she reports that has had dysuria, urinary frequency, left flank pain, intermittent nausea (which is unchanged from her personal history). Denies fever, vomiting, abdominal pain, hematuria.  Susan Boone is currently taking nadolol. Also is allergic to codeine and doxycycline.  Susan Boone  has a past medical history of Migraine; H/O orthostatic hypotension; Esophageal reflux; Allergy; Arthritis; Kidney stones; Anemia; and Asthma. Also  has past surgical history that includes Tubal ligation; Appendectomy; Lithotripsy; Cholecystectomy; Cosmetic surgery; and Breast surgery.  Objective:   Vitals: BP 114/76 mmHg  Pulse 64  Temp(Src) 98.8 F (37.1 C) (Oral)  Resp 18  Wt 211 lb 9.6 oz (95.981 kg)  SpO2 97%  Physical Exam  Constitutional: She is oriented to person, place, and time. She appears well-developed and well-nourished.  Cardiovascular: Normal rate, regular rhythm and intact distal pulses.  Exam reveals no gallop and no friction rub.   No murmur heard. Pulmonary/Chest: No respiratory distress. She has no wheezes. She has no rales.  Abdominal: Soft. Bowel sounds are normal. She exhibits no distension and no mass. There is no tenderness.  No CVA tenderness.  Neurological: She is alert and oriented to person, place, and time.  Skin: Skin is warm and dry.   Results for orders placed or performed in visit on 05/17/15 (from the past 24 hour(s))  POCT urinalysis dipstick     Status: Abnormal   Collection Time:  05/17/15  3:56 PM  Result Value Ref Range   Color, UA yellow yellow   Clarity, UA cloudy (A) clear   Glucose, UA negative negative   Bilirubin, UA negative negative   Ketones, POC UA negative negative   Spec Grav, UA 1.020    Blood, UA small (A) negative   pH, UA 5.5    Protein Ur, POC =30 (A) negative   Urobilinogen, UA 0.2    Nitrite, UA Negative Negative   Leukocytes, UA large (3+) (A) Negative  POCT Microscopic Urinalysis ([facility])     Status: Abnormal   Collection Time: 05/17/15  3:56 PM  Result Value Ref Range   WBC,UR,HPF,POC Too numerous to count  (A) None WBC/hpf   RBC,UR,HPF,POC Few (A) None RBC/hpf   Bacteria Moderate (A) None, Too numerous to count   Mucus Absent Absent   Epithelial Cells, UR Per Microscopy Few (A) None, Too numerous to count cells/hpf   Assessment and Plan :   1. Cystitis 2. Dysuria - Start Bactrim given that patient has done very well with this in the past, urine culture pending. Advised aggressive hydration, recheck with her nephrologist.  Susan Bamberg, PA-C [facility] [phone] 05/17/2015 3:55 PM

## 2015-05-17 NOTE — Patient Instructions (Signed)

## 2015-05-20 LAB — URINE CULTURE

## 2015-06-16 ENCOUNTER — Ambulatory Visit (INDEPENDENT_AMBULATORY_CARE_PROVIDER_SITE_OTHER): Payer: Medicare Other | Admitting: Family Medicine

## 2015-06-16 VITALS — BP 116/70 | HR 67 | Temp 97.8°F | Resp 18 | Ht 62.5 in | Wt 210.0 lb

## 2015-06-16 DIAGNOSIS — N3001 Acute cystitis with hematuria: Secondary | ICD-10-CM | POA: Diagnosis not present

## 2015-06-16 DIAGNOSIS — R300 Dysuria: Secondary | ICD-10-CM

## 2015-06-16 LAB — POCT URINALYSIS DIP (MANUAL ENTRY)
Bilirubin, UA: NEGATIVE
GLUCOSE UA: NEGATIVE
Ketones, POC UA: NEGATIVE
NITRITE UA: POSITIVE — AB
Spec Grav, UA: 1.015
UROBILINOGEN UA: 0.2
pH, UA: 5

## 2015-06-16 LAB — POC MICROSCOPIC URINALYSIS (UMFC): MUCUS RE: ABSENT

## 2015-06-16 MED ORDER — AMOXICILLIN-POT CLAVULANATE 875-125 MG PO TABS: 1.0000 | ORAL_TABLET | Freq: Two times a day (BID) | ORAL | Status: DC

## 2015-06-16 MED ORDER — PHENAZOPYRIDINE HCL 200 MG PO TABS: 200.0000 mg | ORAL_TABLET | Freq: Three times a day (TID) | ORAL | Status: DC | PRN

## 2015-06-16 NOTE — Patient Instructions (Signed)
     IF you received an x-ray today, you will receive an invoice from [facility]. Please contact [facility] at [phone] with questions or concerns regarding your invoice.   IF you received labwork today, you will receive an invoice from [facility]. Please contact [facility] at [phone] with questions or concerns regarding your invoice.   Our billing staff will not be able to assist you with questions regarding bills from these companies.  You will be contacted with the lab results as soon as they are available. The fastest way to get your results is to activate your My Chart account. Instructions are located on the last page of this paperwork. If you have not heard from us regarding the results in 2 weeks, please contact this office.      

## 2015-06-16 NOTE — Progress Notes (Signed)
Subjective:    Patient ID: Susan Boone, female    DOB: 1934/06/22, 81 y.o.   MRN: [id number] By signing my name below, I, Javier Dockerobert Ryan Halas, attest that this documentation has been prepared under the direction and in the presence of Norberto SorensonEva Shaw, MD. Electronically Signed: Javier Dockerobert Ryan Halas, ER Scribe. 06/16/2015. 4:29 PM.  Chief Complaint  Patient presents with  . Urinary Tract Infection    x2-3 days, dysuria    HPI HPI Comments: Susan ShearsMargaret B Boone is a 81 y.o. female who [facility] complaining of dysuria and urinary frequency for the last 3 days. She primarily notices her sx at night when she is trying to sleep. She denies flank pain, fever or chills. She states she drinks significant quantities of water and green tea. Her current bout of symptoms started two weeks after stopping her bactrum abx. She has had three UTIs in the last 12 months.   She states after her last dose of bactrum after her last UTI she got very itchy, and she is concerned she may be developign an allergy to the medication.   She is also having some mild stomach pain.    Past Medical History  Diagnosis Date  . Migraine   . H/O orthostatic hypotension   . Esophageal reflux   . Allergy   . Arthritis   . Kidney stones   . Anemia   . Asthma    Allergies  Allergen Reactions  . Codeine   . Doxycycline    Current Outpatient Prescriptions on File Prior to Visit  Medication Sig Dispense Refill  . nadolol (CORGARD) 80 MG tablet Take 1 tablet (80 mg total) by mouth daily. 90 tablet 3  . sulfamethoxazole-trimethoprim (BACTRIM DS,SEPTRA DS) 800-160 MG tablet Take 1 tablet by mouth 2 (two) times daily. (Patient not taking: Reported on 06/16/2015) 20 tablet 0   No current facility-administered medications on file prior to visit.    Review of Systems  Constitutional: Negative for fever, chills, activity change and appetite change.  Gastrointestinal: Negative for nausea, vomiting and abdominal pain.    Endocrine: Positive for polyuria.  Genitourinary: Positive for dysuria, urgency and frequency. Negative for hematuria, flank pain, decreased urine volume, enuresis, difficulty urinating and pelvic pain.  Psychiatric/Behavioral: Positive for sleep disturbance.      Objective:  BP 116/70 mmHg  Pulse 67  Temp(Src) 97.8 F (36.6 C) (Oral)  Resp 18  Ht 5' 2.5" (1.588 m)  Wt 210 lb (95.255 kg)  BMI 37.77 kg/m2  SpO2 97%  Physical Exam  Constitutional: She is oriented to person, place, and time. She appears well-developed and well-nourished. No distress.  HENT:  Head: Normocephalic and atraumatic.  Eyes: Pupils are equal, round, and reactive to light.  Neck: Neck supple.  Cardiovascular: Normal rate and regular rhythm.   Murmur heard. RRR. 2/6 systolic murmur.   Pulmonary/Chest: Effort normal and breath sounds normal. No respiratory distress. She has no wheezes.  Abdominal: Soft. Bowel sounds are normal. There is no tenderness.  No CVA tenderness.  Musculoskeletal: Normal range of motion.  Neurological: She is alert and oriented to person, place, and time. Coordination normal.  Skin: Skin is warm and dry. She is not diaphoretic.  Psychiatric: She has a normal mood and affect. Her behavior is normal.  Nursing note and vitals reviewed.     Results for orders placed or performed in visit on 06/16/15  POCT urinalysis dipstick  Result Value Ref Range   Color, UA yellow  yellow   Clarity, UA cloudy (A) clear   Glucose, UA negative negative   Bilirubin, UA negative negative   Ketones, POC UA negative negative   Spec Grav, UA 1.015    Blood, UA small (A) negative   pH, UA 5.0    Protein Ur, POC trace (A) negative   Urobilinogen, UA 0.2    Nitrite, UA Positive (A) Negative   Leukocytes, UA large (3+) (A) Negative  POCT Microscopic Urinalysis ([facility])  Result Value Ref Range   WBC,UR,HPF,POC Too numerous to count  (A) None WBC/hpf   RBC,UR,HPF,POC Few (A) None RBC/hpf   Bacteria  Moderate (A) None, Too numerous to count   Mucus Absent Absent   Epithelial Cells, UR Per Microscopy Few (A) None, Too numerous to count cells/hpf    Assessment & Plan:   1. Dysuria   2. Acute cystitis with hematuria   H/o freq recurrent nephrolithiasis so does see a urologist. Normally bactrim works well for her and did complete 10d course of Bactrim last mo for Kleb UTI that was pan-sensitive except for amp.  Normally bactrim works well for her and did complete 10d course of Bactrim last mo for Kleb UT Pt reluctant to use a fluoroquinolone due to risk of tendon rupture Getting UTIs several times a yr only so has not warranted daily prophylactic abx prior.  Orders Placed This Encounter  Procedures  . Urine culture  . POCT urinalysis dipstick  . POCT Microscopic Urinalysis ([facility])    Meds ordered this encounter  Medications  . amoxicillin-clavulanate (AUGMENTIN) 875-125 MG tablet    Sig: Take 1 tablet by mouth 2 (two) times daily.    Dispense:  14 tablet    Refill:  0  . phenazopyridine (PYRIDIUM) 200 MG tablet    Sig: Take 1 tablet (200 mg total) by mouth 3 (three) times daily as needed for pain.    Dispense:  60 tablet    Refill:  0    I personally performed the services described in this documentation, which was scribed in my presence. The recorded information has been reviewed and considered, and addended by me as needed.  Norberto Sorenson, MD MPH

## 2015-06-19 LAB — URINE CULTURE

## 2015-06-26 ENCOUNTER — Ambulatory Visit (INDEPENDENT_AMBULATORY_CARE_PROVIDER_SITE_OTHER): Payer: Medicare Other | Admitting: Physician Assistant

## 2015-06-26 ENCOUNTER — Other Ambulatory Visit: Payer: Self-pay | Admitting: Radiology

## 2015-06-26 VITALS — BP 128/70 | HR 61 | Temp 98.1°F | Resp 17 | Ht 62.5 in | Wt 215.0 lb

## 2015-06-26 DIAGNOSIS — N898 Other specified noninflammatory disorders of vagina: Secondary | ICD-10-CM | POA: Diagnosis not present

## 2015-06-26 DIAGNOSIS — R32 Unspecified urinary incontinence: Secondary | ICD-10-CM

## 2015-06-26 DIAGNOSIS — B379 Candidiasis, unspecified: Secondary | ICD-10-CM

## 2015-06-26 DIAGNOSIS — N393 Stress incontinence (female) (male): Secondary | ICD-10-CM

## 2015-06-26 DIAGNOSIS — N3001 Acute cystitis with hematuria: Secondary | ICD-10-CM | POA: Diagnosis not present

## 2015-06-26 DIAGNOSIS — R300 Dysuria: Secondary | ICD-10-CM

## 2015-06-26 LAB — POC MICROSCOPIC URINALYSIS (UMFC)

## 2015-06-26 LAB — POCT URINALYSIS DIP (MANUAL ENTRY)
Bilirubin, UA: NEGATIVE
Glucose, UA: NEGATIVE
Ketones, POC UA: NEGATIVE
Nitrite, UA: NEGATIVE
PH UA: 5
Protein Ur, POC: NEGATIVE
Spec Grav, UA: 1.015
UROBILINOGEN UA: 0.2

## 2015-06-26 LAB — POCT WET + KOH PREP
Trich by wet prep: ABSENT
YEAST BY WET PREP: ABSENT

## 2015-06-26 MED ORDER — CEPHALEXIN 500 MG PO CAPS: 500.0000 mg | ORAL_CAPSULE | Freq: Two times a day (BID) | ORAL | Status: DC

## 2015-06-26 MED ORDER — ZINC OXIDE 20 % EX OINT: 1.0000 "application " | TOPICAL_OINTMENT | Freq: Two times a day (BID) | CUTANEOUS | Status: DC

## 2015-06-26 MED ORDER — FLUCONAZOLE 150 MG PO TABS: 150.0000 mg | ORAL_TABLET | ORAL | Status: DC

## 2015-06-26 NOTE — Progress Notes (Signed)
[facility] [address], [place] [address] [phone]  Date:  06/26/2015   Name:  Susan Boone   DOB:  [birth year]   MRN:  [id number]  PCP:  Abbe Amsterdam, MD   Chief Complaint  Patient presents with  . Dysuria  . Thrush    History of Present Illness:  Susan Boone is a 81 y.o. female patient who presents to [facility] for cc of vaginal irritation and dysuria.   1 week of vaginal irritation.  She has pain with urination that has been intermittent for weeks, but progressively worsened over the last week.  She has noticed vaginal itching and rash along her genitalia.  She has not noticed vaginal discharge.  She has also noticed that her tongue was very white.  She has noticed change in her taste.  She has no abdominal pain or back pain.  No hx of fever.  She has had a recent hx of antibiotic use wth recurrent urinary tract infections.  She was followed by [facility], but stopped due to her dissatisfaction.  She has had recent abx due to uti about 1 week ago.  She was also treated less than 1 month prior for uti.  First, use of the bactrim.  This was then the augmentin.     Patient Active Problem List   Diagnosis Date Noted  . Recurrent UTI 07/07/2013  . UMBILICAL HERNIA 10/20/2007  . IRRITABLE BOWEL SYNDROME 10/20/2007  . RENAL CYST, LEFT 10/20/2007  . ARTHRITIS 10/20/2007  . DYSPHAGIA UNSPECIFIED 10/20/2007  . DYSPHAGIA 10/20/2007  . DIARRHEA 10/20/2007  . ANEMIA, HX OF 10/20/2007  . NEPHROLITHIASIS, HX OF 10/20/2007    Past Medical History  Diagnosis Date  . Migraine   . H/O orthostatic hypotension   . Esophageal reflux   . Allergy   . Arthritis   . Kidney stones   . Anemia   . Asthma     Past Surgical History  Procedure Laterality Date  . Tubal ligation    . Appendectomy    . Lithotripsy    . Cholecystectomy    . Cosmetic surgery    . Breast surgery      Social History  Substance Use Topics  . Smoking status: Never  Smoker   . Smokeless tobacco: None  . Alcohol Use: No    Family History  Problem Relation Age of Onset  . Stroke Father   . Hyperlipidemia Father   . Lung cancer Mother   . Cancer Maternal Grandmother     Allergies  Allergen Reactions  . Codeine   . Doxycycline     Medication list has been reviewed and updated.  Current Outpatient Prescriptions on File Prior to Visit  Medication Sig Dispense Refill  . nadolol (CORGARD) 80 MG tablet Take 1 tablet (80 mg total) by mouth daily. 90 tablet 3   No current facility-administered medications on file prior to visit.    ROS ROS otherwise unremarkable unless listed above.  Physical Examination: BP 128/70 mmHg  Pulse 61  Temp(Src) 98.1 F (36.7 C) (Oral)  Resp 17  Ht 5' 2.5" (1.588 m)  Wt 215 lb (97.523 kg)  BMI 38.67 kg/m2  SpO2 98% Ideal Body Weight: Weight in (lb) to have BMI = 25: 138.6  Physical Exam  Constitutional: She is oriented to person, place, and time. She appears well-developed and well-nourished. No distress.  HENT:  Head: Normocephalic and atraumatic.  Right Ear: External ear normal.  Left  Ear: External ear normal.  Mouth/Throat: No oropharyngeal exudate, posterior oropharyngeal edema or posterior oropharyngeal erythema.  Roof of mouth and tongue without discoloration plaque or film.  No erythema present throughout.   Eyes: Conjunctivae and EOM are normal. Pupils are equal, round, and reactive to light.  Cardiovascular: Normal rate.   Pulmonary/Chest: Effort normal. No respiratory distress.  Abdominal: Hernia confirmed negative in the right inguinal area and confirmed negative in the left inguinal area.  Genitourinary: Pelvic exam was performed with patient supine. There is rash on the right labia. There is rash on the left labia. No erythema in the vagina.  Erythema along the labia majora.  There is atrophic vaginitis.  Erythema and yellow discharge near the cervix.  Neurological: She is alert and oriented  to person, place, and time.  Skin: She is not diaphoretic.  Psychiatric: She has a normal mood and affect. Her behavior is normal.    Results for orders placed or performed in visit on 06/26/15  POCT Microscopic Urinalysis ([facility])  Result Value Ref Range   WBC,UR,HPF,POC Too numerous to count  (A) None WBC/hpf   RBC,UR,HPF,POC Too numerous to count  (A) None RBC/hpf   Bacteria Moderate (A) None, Too numerous to count   Mucus Present (A) Absent   Epithelial Cells, UR Per Microscopy Few (A) None, Too numerous to count cells/hpf  POCT urinalysis dipstick  Result Value Ref Range   Color, UA yellow yellow   Clarity, UA cloudy (A) clear   Glucose, UA negative negative   Bilirubin, UA negative negative   Ketones, POC UA negative negative   Spec Grav, UA 1.015    Blood, UA small (A) negative   pH, UA 5.0    Protein Ur, POC negative negative   Urobilinogen, UA 0.2    Nitrite, UA Negative Negative   Leukocytes, UA moderate (2+) (A) Negative  POCT Wet + KOH Prep  Result Value Ref Range   Yeast by KOH Present Present, Absent   Yeast by wet prep Absent Present, Absent   WBC by wet prep Few None, Few, Too numerous to count   Clue Cells Wet Prep HPF POC None None, Too numerous to count   Trich by wet prep Absent Present, Absent   Bacteria Wet Prep HPF POC Moderate (A) None, Few, Too numerous to count   Epithelial Cells By [employer] ([facility]) Few None, Few, Too numerous to count   RBC,UR,HPF,POC Few (A) None RBC/hpf    Assessment and Plan: [name] is a 81 y.o. female who is here today for vaginal irritation and dysuria. This appears to be yeast and likely secondary to this routine cystitis and treated with abx.  I am advising that she do the diflucan once ever 3 days for 2 doses.  She will also place a nystatin/hydrocortisone cream to the vaginal area as protection.  i have encouraged her to keep the padding dry and changed.   We will treat the uti at this time.  Placing urine culture  to assess.  I have encouraged her that she really should see a urologist.  She is very apprehensive about getting involved with her former urologist.  I have advised that I will try to be persistent in her seeing another urologist in this area since she is unable to commute distances.  She will let me know her ultimate decision when I follow up with her in the next 3 days with urine culture.   Vaginal irritation - Plan: POCT  Microscopic Urinalysis ([facility]), POCT urinalysis dipstick, POCT Wet + KOH Prep, fluconazole (DIFLUCAN) 150 MG tablet, hydrocortisone cream-nystatin cream-zinc oxide  Incontinence in female - Plan: POCT Microscopic Urinalysis ([facility]), POCT urinalysis dipstick  Acute cystitis with hematuria  Dysuria - Plan: cephALEXin (KEFLEX) 500 MG capsule, Urine culture  Yeast infection - Plan: fluconazole (DIFLUCAN) 150 MG tablet, hydrocortisone cream-nystatin cream-zinc oxide   Trena PlattStephanie Arshad Oberholzer, PA-C [facility] 06/26/2015 7:16 PM

## 2015-06-26 NOTE — Telephone Encounter (Signed)
Spoke to pharmacy if they mix cream for patient insurance will not cover, will be expensive. I have advised ok to dispense nystantin cream RX, OTC Hydrocortizone, and Zinc Oxide.  She will mix equal parts of each and apply bid.

## 2015-06-26 NOTE — Patient Instructions (Addendum)
IF you received an x-ray today, you will receive an invoice from [facility]. Please contact [facility] at [phone] with questions or concerns regarding your invoice.   IF you received labwork today, you will receive an invoice from [facility]. Please contact [facility] at [phone] with questions or concerns regarding your invoice.   Our billing staff will not be able to assist you with questions regarding bills from these companies.  You will be contacted with the lab results as soon as they are available. The fastest way to get your results is to activate your My Chart account. Instructions are located on the last page of this paperwork. If you have not heard from us regarding the results in 2 weeks, please contact this office.    Please apply the cream twice per day. I would like you to take the antibiotic as prescribed. I will send a referral to [facility]ist [facility] as [place].  Please await this phone call.  We will be deliberate in attempting to see someone else.    Monilial Vaginitis Vaginitis in a soreness, swelling and redness (inflammation) of the vagina and vulva. Monilial vaginitis is not a sexually transmitted infection. CAUSES  Yeast vaginitis is caused by yeast (candida) that is normally found in your vagina. With a yeast infection, the candida has overgrown in number to a point that upsets the chemical balance. SYMPTOMS   White, thick vaginal discharge.  Swelling, itching, redness and irritation of the vagina and possibly the lips of the vagina (vulva).  Burning or painful urination.  Painful intercourse. DIAGNOSIS  Things that may contribute to monilial vaginitis are:  Postmenopausal and virginal states.  Pregnancy.  Infections.  Being tired, sick or stressed, especially if you had monilial vaginitis in the past.  Diabetes. Good control will help lower the chance.  Birth  control pills.  Tight fitting garments.  Using bubble bath, feminine sprays, douches or deodorant tampons.  Taking certain medications that kill germs (antibiotics).  Sporadic recurrence can occur if you become ill. TREATMENT  Your caregiver will give you medication.  There are several kinds of anti monilial vaginal creams and suppositories specific for monilial vaginitis. For recurrent yeast infections, use a suppository or cream in the vagina 2 times a week, or as directed.  Anti-monilial or steroid cream for the itching or irritation of the vulva may also be used. Get your caregiver's permission.  Painting the vagina with methylene blue solution may help if the monilial cream does not work.  Eating yogurt may help prevent monilial vaginitis. HOME CARE INSTRUCTIONS   Finish all medication as prescribed.  Do not have sex until treatment is completed or after your caregiver tells you it is okay.  Take warm sitz baths.  Do not douche.  Do not use tampons, especially scented ones.  Wear cotton underwear.  Avoid tight pants and panty hose.  Tell your sexual partner that you have a yeast infection. They should go to their caregiver if they have symptoms such as mild rash or itching.  Your sexual partner should be treated as well if your infection is difficult to eliminate.  Practice safer sex. Use condoms.  Some vaginal medications cause latex condoms to fail. Vaginal medications that harm condoms are:  Cleocin cream.  Butoconazole (Femstat).  Terconazole (Terazol) vaginal suppository.  Miconazole (Monistat) (may be purchased over the counter). SEEK MEDICAL CARE IF:   You have a temperature by mouth above 102  F (38.9 C).  The infection is getting worse after 2 days of treatment.  The infection is not getting better after 3 days of treatment.  You develop blisters in or around your vagina.  You develop vaginal bleeding, and it is not your menstrual  period.  You have pain when you urinate.  You develop intestinal problems.  You have pain with sexual intercourse.   This information is not intended to replace advice given to you by your health care provider. Make sure you discuss any questions you have with your health care provider.   Document Released: 12/17/2004 Document Revised: 06/01/2011 Document Reviewed: 09/10/2014 Elsevier Interactive Patient Education [employer]

## 2015-06-28 LAB — URINE CULTURE: Colony Count: >=100000

## 2015-07-10 ENCOUNTER — Telehealth: Payer: Self-pay | Admitting: *Deleted

## 2015-07-10 NOTE — Telephone Encounter (Signed)
Correction:  She did not take any of the antibiotic medication.

## 2015-07-18 NOTE — Telephone Encounter (Signed)
I do not understand the message.  She should return here if she is still having symptoms after the use of the cream.  I am referring her back to urology.  I have expressed to send to a different provider.  If you are experiencing fever, and dysuria--we should see you again.

## 2015-07-18 NOTE — Telephone Encounter (Signed)
Can someone from the referral please call this pt. She is requesting a call from someone in your department about her recent referral she is pretty nervous

## 2015-07-18 NOTE — Telephone Encounter (Signed)
Called patient and informed her that we sent her referral to Dr [facility]verely [facility]aceAlbertson at [facility] on 07/16/15.  I gave her their phone number in case she wanted to call and check on her referral status.

## 2016-09-26 ENCOUNTER — Other Ambulatory Visit: Payer: Self-pay | Admitting: Family Medicine

## 2016-09-26 DIAGNOSIS — G43809 Other migraine, not intractable, without status migrainosus: Secondary | ICD-10-CM

## 2016-10-02 ENCOUNTER — Ambulatory Visit (INDEPENDENT_AMBULATORY_CARE_PROVIDER_SITE_OTHER): Payer: Medicare Other | Admitting: Family Medicine

## 2016-10-02 ENCOUNTER — Encounter: Payer: Self-pay | Admitting: Family Medicine

## 2016-10-02 VITALS — BP 126/82 | HR 63 | Temp 97.9°F | Resp 18 | Ht 61.81 in | Wt 206.0 lb

## 2016-10-02 DIAGNOSIS — N390 Urinary tract infection, site not specified: Secondary | ICD-10-CM

## 2016-10-02 DIAGNOSIS — M25561 Pain in right knee: Secondary | ICD-10-CM | POA: Diagnosis not present

## 2016-10-02 DIAGNOSIS — Z9882 Breast implant status: Secondary | ICD-10-CM

## 2016-10-02 DIAGNOSIS — R5383 Other fatigue: Secondary | ICD-10-CM

## 2016-10-02 DIAGNOSIS — M7989 Other specified soft tissue disorders: Secondary | ICD-10-CM | POA: Diagnosis not present

## 2016-10-02 DIAGNOSIS — M25551 Pain in right hip: Secondary | ICD-10-CM | POA: Diagnosis not present

## 2016-10-02 DIAGNOSIS — N6320 Unspecified lump in the left breast, unspecified quadrant: Secondary | ICD-10-CM

## 2016-10-02 DIAGNOSIS — Z87442 Personal history of urinary calculi: Secondary | ICD-10-CM

## 2016-10-02 DIAGNOSIS — M25562 Pain in left knee: Secondary | ICD-10-CM

## 2016-10-02 DIAGNOSIS — K580 Irritable bowel syndrome with diarrhea: Secondary | ICD-10-CM | POA: Diagnosis not present

## 2016-10-02 DIAGNOSIS — M545 Low back pain: Secondary | ICD-10-CM

## 2016-10-02 DIAGNOSIS — I83893 Varicose veins of bilateral lower extremities with other complications: Secondary | ICD-10-CM

## 2016-10-02 DIAGNOSIS — G8929 Other chronic pain: Secondary | ICD-10-CM

## 2016-10-02 DIAGNOSIS — N281 Cyst of kidney, acquired: Secondary | ICD-10-CM

## 2016-10-02 LAB — POCT URINALYSIS DIP (MANUAL ENTRY)
BILIRUBIN UA: NEGATIVE mg/dL
Bilirubin, UA: NEGATIVE
GLUCOSE UA: NEGATIVE mg/dL
Nitrite, UA: NEGATIVE
SPEC GRAV UA: 1.025 (ref 1.010–1.025)
UROBILINOGEN UA: 0.2 U/dL
pH, UA: 5.5 (ref 5.0–8.0)

## 2016-10-02 NOTE — Patient Instructions (Signed)
     IF you received an x-ray today, you will receive an invoice from [facility]. Please contact [facility] at [phone] with questions or concerns regarding your invoice.   IF you received labwork today, you will receive an invoice from [facility]. Please contact [facility] at [phone] with questions or concerns regarding your invoice.   Our billing staff will not be able to assist you with questions regarding bills from these companies.  You will be contacted with the lab results as soon as they are available. The fastest way to get your results is to activate your My Chart account. Instructions are located on the last page of this paperwork. If you have not heard from us regarding the results in 2 weeks, please contact this office.     

## 2016-10-02 NOTE — Progress Notes (Signed)
Subjective:    Patient ID: Susan Boone, female    DOB: [birth year], 82 y.o.   MRN: [id number]  10/02/2016  Establish Care (need new provider )   HPI This 82 y.o. female presents for to establish care.  Previous [facility] patient.  Recurrent UTI: urology placed on one Keflex daily; desires to stay on forever; no recurrent UTI since suppression.   Does not desire surgical correction.  Renal mass R: followed by urology.  Followed every three months by [facility] in [place].  Has scheduled for repeat CT renal in August.  Dr. Beverely Pace resigned.  Dr. Adriana Simas also quit the next day.    Nephrolithiasis:  In past.  Urge incontinence:  In past; s/p urology consultation.   Fatigue/hypersomnolence: chronic issue that interferes with quality of life.   Arthralgias: knees/hips/lower back:  Alusio/hip and knee. Needs imaging; willing to undergo injections.  Feet swelling: and leg swelling; varicose veins; blue.  Previous evaluation Dr. Arbie Cookey of vein specialist. L>R swelling.    L breast augmentation: March L breast implant has moved since March 2018.  5. IBS-D: last few days for the past three days.  Chronic since gallbladder out.     BP Readings from Last 3 Encounters:  10/02/16 126/82  06/26/15 128/70  06/16/15 116/70   Wt Readings from Last 3 Encounters:  10/02/16 206 lb (93.4 kg)  06/26/15 215 lb (97.5 kg)  06/16/15 210 lb (95.3 kg)   Immunization History  Administered Date(s) Administered  . Influenza-Unspecified 12/21/2013  . Pneumococcal Conjugate-13 02/06/2015    Review of Systems  Constitutional: Positive for fatigue. Negative for chills, diaphoresis and fever.  Eyes: Negative for visual disturbance.  Respiratory: Negative for cough and shortness of breath.   Cardiovascular: Positive for leg swelling. Negative for chest pain and palpitations.  Gastrointestinal: Positive for diarrhea. Negative for abdominal pain, constipation, nausea and vomiting.  Endocrine: Negative  for cold intolerance, heat intolerance, polydipsia, polyphagia and polyuria.  Genitourinary: Positive for urgency. Negative for decreased urine volume, dysuria, frequency and hematuria.  Musculoskeletal: Positive for arthralgias and gait problem.  Neurological: Negative for dizziness, tremors, seizures, syncope, facial asymmetry, speech difficulty, weakness, light-headedness, numbness and headaches.  Psychiatric/Behavioral: Negative for dysphoric mood. The patient is not nervous/anxious.     Past Medical History:  Diagnosis Date  . Allergy   . Anemia   . Arthritis   . Asthma   . Esophageal reflux   . H/O orthostatic hypotension   . Kidney stones   . Migraine    Past Surgical History:  Procedure Laterality Date  . APPENDECTOMY    . BREAST SURGERY    . CHOLECYSTECTOMY    . COSMETIC SURGERY    . LITHOTRIPSY    . TUBAL LIGATION     Allergies  Allergen Reactions  . Codeine   . Doxycycline    Current Outpatient Prescriptions  Medication Sig Dispense Refill  . cephALEXin (KEFLEX) 500 MG capsule Take 1 capsule (500 mg total) by mouth 2 (two) times daily. 14 capsule 0  . nadolol (CORGARD) 80 MG tablet Take 1 tablet (80 mg total) by mouth daily. 90 tablet 1   No current facility-administered medications for this visit.    Social History   Social History  . Marital status: Divorced    Spouse name: N/A  . Number of children: N/A  . Years of education: N/A   Occupational History  . retired    Social History Main Topics  . Smoking status: Never Smoker  .  Smokeless tobacco: Never Used  . Alcohol use No  . Drug use: No  . Sexual activity: Not on file   Other Topics Concern  . Not on file   Social History Narrative  . No narrative on file   Family History  Problem Relation Age of Onset  . Stroke Father   . Hyperlipidemia Father   . Lung cancer Mother   . Cancer Maternal Grandmother        Objective:    BP 126/82   Pulse 63   Temp 97.9 F (36.6 C) (Oral)    Resp 18   Ht 5' 1.81" (1.57 m)   Wt 206 lb (93.4 kg)   SpO2 96%   BMI 37.91 kg/m  Physical Exam  Constitutional: She is oriented to person, place, and time. She appears well-developed and well-nourished. No distress.  HENT:  Head: Normocephalic and atraumatic.  Right Ear: External ear normal.  Left Ear: External ear normal.  Nose: Nose normal.  Mouth/Throat: Oropharynx is clear and moist.  Eyes: Pupils are equal, round, and reactive to light. Conjunctivae and EOM are normal.  Neck: Normal range of motion. Neck supple. Carotid bruit is not present. No thyromegaly present.  Cardiovascular: Normal rate, regular rhythm, normal heart sounds and intact distal pulses.  Exam reveals no gallop and no friction rub.   No murmur heard. Pulmonary/Chest: Effort normal and breath sounds normal. She has no wheezes. She has no rales. Right breast exhibits no inverted nipple, no mass, no nipple discharge, no skin change and no tenderness. Left breast exhibits no inverted nipple, no mass, no skin change and no tenderness. Breasts are asymmetrical.  L breast prosthesis in upper aspect of breast.  Nontender.  Abdominal: Soft. Bowel sounds are normal. She exhibits no distension and no mass. There is no tenderness. There is no rebound and no guarding.  Lymphadenopathy:    She has no cervical adenopathy.  Neurological: She is alert and oriented to person, place, and time. No cranial nerve deficit.  Skin: Skin is warm and dry. No rash noted. She is not diaphoretic. No erythema. No pallor.  Psychiatric: She has a normal mood and affect. Her behavior is normal.   Depression screen [facility] 2/9 10/02/2016 06/26/2015 05/17/2015 02/06/2015 01/14/2015  Decreased Interest 0 0 0 0 0  Down, Depressed, Hopeless 0 0 0 0 0  PHQ - 2 Score 0 0 0 0 0   Fall Risk  10/02/2016 02/06/2015 04/23/2014  Falls in the past year? No No No        Assessment & Plan:   1. Other fatigue   2. Leg swelling   3. Left breast mass   4. Chronic  pain of both knees   5. Right hip pain   6. Chronic bilateral low back pain without sciatica   7. Varicose veins of both legs with edema   8. S/P bilateral breast implants    -chronic fatigue.  Obtain labs; consider sleep study if labs are normal. -suffering with arthralgias including B knees and R hip and lower back; refer to ortho. -chronic varicose veins with edema B; recommend follow-up with vascular surgery. -LEFT breast implant with displacement; refer to plastic surgery and obtain LEFT breast US. -followed by urology for LEFT renal cyst, nephrolithiasis. Also suffering with urinary incontinence; recommend discussing with urology.  Maintained on daily Keflex for UTI prophylaxis -prolonged face-to-face for 40 minutes with greater than 50% of time dedicated to counseling and coordination of care.  Orders Placed  This Encounter  Procedures  . US BREAST COMPLETE UNI LEFT INC AXILLA    Standing Status:   Future    Standing Expiration Date:   12/03/2017    Order Specific Question:   Reason for Exam (SYMPTOM  OR DIAGNOSIS REQUIRED)    Answer:   L breast implant with mass upper breast    Order Specific Question:   Preferred imaging location?    Answer:   [facility]  . CBC with Differential/Platelet  . Comprehensive metabolic panel  . TSH  . Ambulatory referral to Orthopedic Surgery    Referral Priority:   Routine    Referral Type:   Surgical    Referral Reason:   Specialty Services Required    Requested Specialty:   Orthopedic Surgery    Number of Visits Requested:   1  . Ambulatory referral to Vascular Surgery    Referral Priority:   Routine    Referral Type:   Surgical    Referral Reason:   Specialty Services Required    Requested Specialty:   Vascular Surgery    Number of Visits Requested:   1  . Ambulatory referral to Plastic Surgery    Referral Priority:   Routine    Referral Type:   Surgical    Referral Reason:   Specialty Services Required    Requested Specialty:    Plastic Surgery    Number of Visits Requested:   1  . POCT urinalysis dipstick  . EKG 12-Lead   No orders of the defined types were placed in this encounter.   Return in about 3 months (around 01/02/2017).   Naimah Yingst Paulita FujitaMartin Haizel Gatchell, M.D. Primary Care at [facility]  [place] previously [facility] [address] [place], [place]  [id number] [phone] phone [phone] fax

## 2016-10-03 ENCOUNTER — Telehealth: Payer: Self-pay | Admitting: Family Medicine

## 2016-10-03 ENCOUNTER — Other Ambulatory Visit: Payer: Self-pay | Admitting: Family Medicine

## 2016-10-03 ENCOUNTER — Other Ambulatory Visit: Payer: Self-pay

## 2016-10-03 DIAGNOSIS — G43809 Other migraine, not intractable, without status migrainosus: Secondary | ICD-10-CM

## 2016-10-03 LAB — CBC WITH DIFFERENTIAL/PLATELET
BASOS ABS: 0 10*3/uL (ref 0.0–0.2)
Basos: 0 %
EOS (ABSOLUTE): 0.3 10*3/uL (ref 0.0–0.4)
EOS: 4 %
HEMATOCRIT: 44.8 % (ref 34.0–46.6)
Hemoglobin: 14.8 g/dL (ref 11.1–15.9)
Immature Grans (Abs): 0 10*3/uL (ref 0.0–0.1)
Immature Granulocytes: 0 %
LYMPHS ABS: 2.3 10*3/uL (ref 0.7–3.1)
Lymphs: 34 %
MCH: 27.9 pg (ref 26.6–33.0)
MCHC: 33 g/dL (ref 31.5–35.7)
MCV: 85 fL (ref 79–97)
MONOCYTES: 7 %
MONOS ABS: 0.5 10*3/uL (ref 0.1–0.9)
NEUTROS PCT: 55 %
Neutrophils Absolute: 3.6 10*3/uL (ref 1.4–7.0)
Platelets: 167 10*3/uL (ref 150–379)
RBC: 5.3 x10E6/uL — AB (ref 3.77–5.28)
RDW: 15.3 % (ref 12.3–15.4)
WBC: 6.7 10*3/uL (ref 3.4–10.8)

## 2016-10-03 LAB — COMPREHENSIVE METABOLIC PANEL
A/G RATIO: 1.5 (ref 1.2–2.2)
ALT: 15 IU/L (ref 0–32)
AST: 22 IU/L (ref 0–40)
Albumin: 4.1 g/dL (ref 3.5–4.7)
Alkaline Phosphatase: 68 IU/L (ref 39–117)
BILIRUBIN TOTAL: 0.6 mg/dL (ref 0.0–1.2)
BUN/Creatinine Ratio: 11 — ABNORMAL LOW (ref 12–28)
BUN: 8 mg/dL (ref 8–27)
CHLORIDE: 103 mmol/L (ref 96–106)
CO2: 24 mmol/L (ref 20–29)
Calcium: 9.2 mg/dL (ref 8.7–10.3)
Creatinine, Ser: 0.75 mg/dL (ref 0.57–1.00)
GFR calc Af Amer: 87 mL/min/{1.73_m2} (ref >59)
GFR calc non Af Amer: 76 mL/min/{1.73_m2} (ref >59)
GLOBULIN, TOTAL: 2.7 g/dL (ref 1.5–4.5)
Glucose: 84 mg/dL (ref 65–99)
POTASSIUM: 3.9 mmol/L (ref 3.5–5.2)
SODIUM: 142 mmol/L (ref 134–144)
Total Protein: 6.8 g/dL (ref 6.0–8.5)

## 2016-10-03 LAB — TSH: TSH: 1.63 u[IU]/mL (ref 0.450–4.500)

## 2016-10-03 MED ORDER — NADOLOL 80 MG PO TABS: 80.0000 mg | ORAL_TABLET | Freq: Every day | ORAL | 1 refills | Status: DC

## 2016-10-03 NOTE — Telephone Encounter (Signed)
PATIENT SAID SHE SAW DR. Katrinka BlazingSMITH Friday (10/02/16) AND SHE SAID SHE TOLD THE NURSE THAT SHE NEEDED TO GET A REFILL ON HER NADOLOL 80 MG BECAUSE SHE IS COMPLETELY OUT. SHE CALLED AND SAID IT HAD  NOT BEEN CALLED IN AND SHE DOES NOT KNOW WHAT IT WILL DO IF SHE MISSES ONE. PATIENT WOULD LIKE IT CALLED IN TODAY. BEST PHONE [phone] (CELL) PHARMACY CHOICE IS [facility] [address]. MBC

## 2016-10-03 NOTE — Telephone Encounter (Signed)
Refilled pt Rx Nadolol 80 mg  for 90 days with one refill.

## 2016-10-06 NOTE — Telephone Encounter (Signed)
Pt called stating Rx for Corgard was [facility] on 7/14 and it was wrong. Her medication [facility] have [facility]. [facility] and called and [facility] at [facility].

## 2016-10-23 ENCOUNTER — Other Ambulatory Visit: Payer: Self-pay

## 2016-10-23 DIAGNOSIS — I83893 Varicose veins of bilateral lower extremities with other complications: Secondary | ICD-10-CM

## 2016-11-19 ENCOUNTER — Other Ambulatory Visit: Payer: Self-pay | Admitting: Plastic Surgery

## 2016-11-19 DIAGNOSIS — T8543XA Leakage of breast prosthesis and implant, initial encounter: Secondary | ICD-10-CM

## 2016-11-25 ENCOUNTER — Ambulatory Visit
Admission: RE | Admit: 2016-11-25 | Discharge: 2016-11-25 | Disposition: A | Discharge status: Home / Self Care | Payer: Medicare Other | Source: Ambulatory Visit | Attending: Plastic Surgery | Admitting: Plastic Surgery

## 2016-11-25 ENCOUNTER — Other Ambulatory Visit: Payer: Self-pay | Admitting: Plastic Surgery

## 2016-11-25 DIAGNOSIS — T8543XA Leakage of breast prosthesis and implant, initial encounter: Secondary | ICD-10-CM

## 2016-12-02 ENCOUNTER — Telehealth: Payer: Self-pay | Admitting: Family Medicine

## 2016-12-02 DIAGNOSIS — N3946 Mixed incontinence: Secondary | ICD-10-CM

## 2016-12-02 DIAGNOSIS — N200 Calculus of kidney: Secondary | ICD-10-CM

## 2016-12-02 DIAGNOSIS — R300 Dysuria: Secondary | ICD-10-CM

## 2016-12-02 DIAGNOSIS — N2889 Other specified disorders of kidney and ureter: Secondary | ICD-10-CM

## 2016-12-02 DIAGNOSIS — N390 Urinary tract infection, site not specified: Secondary | ICD-10-CM

## 2016-12-02 NOTE — Telephone Encounter (Signed)
PATIENT WOULD LIKE DR. Katrinka BlazingSMITH TO KNOW THAT HER NEUROLOGIST (DR. Wilburn MylarELIZABETH ALBERTSON) HAS STOPPED WORKING AT [facility] AND HER STAFF WILL NOT TELL HER WHERE SHE WENT. SHE PRESCRIBES HER TO HAVE THE MAINTENANCE DRUG CEPHALEXIN (KEFLEX) 500 MG SO THAT SHE WILL NOT GET BACK TO BACK UTI INFECTIONS. SHE ONLY HAS ABOUT 10 DAYS LEFT BUT WITH THE HURRICANE SHE PROBABLY WILL NOT BE ABLE TO GET OUT. SHE DESPERATELY WOULD LIKE DR. Katrinka BlazingSMITH TO CALL HER IN A REFILL AS SOON AS POSSIBLE. SHE SAID DR. Katrinka BlazingSMITH HAS DOCUMENTATION IN HER CHART ABOUT HER REOCCURRING INFECTIONS. BEST PHONE [phone] (HOME) PHARMACY CHOICE IS [facility] [address]. MBC

## 2016-12-03 NOTE — Telephone Encounter (Signed)
Dr Katrinka BlazingSmith, Dr E. Beverely Pacelbertson is no longer practicing with [facility], this patient has hx of recurrent UTI, requesting standing rx for Keflex. Please advise, thank you/ S.Noble Cicalese,CMA

## 2016-12-07 MED ORDER — CEPHALEXIN 500 MG PO CAPS: 500.0000 mg | ORAL_CAPSULE | Freq: Every day | ORAL | 2 refills | Status: DC

## 2016-12-07 NOTE — Telephone Encounter (Signed)
Call patient ---- 1.  I am happy to provide a refill of Keflex for patient to UTI prophylaxis.  2.  Dr. Beverely Pace was also following patient for a kidney mass and pt is to have a repeat CT of this kidney mass.  Does she have this repeat CT scheduled?  She will need to establish with a new urologist to monitor this kidney mass.  Would she like me to refer her to a different urology group in [place]?

## 2016-12-07 NOTE — Telephone Encounter (Signed)
Patient states she is going to try to stay with Dr. Beverely Pace and is hoping once she get established somewhere else she can find her.      She states she thinks her last scan was June or March.  [facility] [address]

## 2016-12-09 NOTE — Telephone Encounter (Signed)
Noted.  Would she like me to order CT abdomen while she is trying to locate Dr. Beverely Pace?

## 2016-12-10 ENCOUNTER — Other Ambulatory Visit: Payer: Self-pay | Admitting: Family Medicine

## 2016-12-10 DIAGNOSIS — R300 Dysuria: Secondary | ICD-10-CM

## 2016-12-10 NOTE — Telephone Encounter (Signed)
Pt states she would like to wait for the CT scan until she speaks to Dr. Beverely Pace, she also would like to know if you could put in a Standing order or the Keflex to [facility] pharmacy?

## 2016-12-14 NOTE — Telephone Encounter (Signed)
I provided patient with three months of refills for Keflex.  She should establish with a urologist before the medication runs out.

## 2016-12-15 NOTE — Telephone Encounter (Signed)
Called and informed pt that her Rx was at pharmacy to pick up.

## 2016-12-16 ENCOUNTER — Ambulatory Visit (HOSPITAL_COMMUNITY)
Admission: RE | Admit: 2016-12-16 | Discharge: 2016-12-16 | Disposition: A | Discharge status: Home / Self Care | Payer: Medicare Other | Source: Ambulatory Visit | Attending: Vascular Surgery | Admitting: Vascular Surgery

## 2016-12-16 ENCOUNTER — Ambulatory Visit (INDEPENDENT_AMBULATORY_CARE_PROVIDER_SITE_OTHER): Payer: Medicare Other | Admitting: Vascular Surgery

## 2016-12-16 ENCOUNTER — Encounter: Payer: Self-pay | Admitting: Vascular Surgery

## 2016-12-16 VITALS — BP 126/74 | HR 63 | Temp 97.3°F | Ht 62.0 in | Wt 210.0 lb

## 2016-12-16 DIAGNOSIS — I83813 Varicose veins of bilateral lower extremities with pain: Secondary | ICD-10-CM | POA: Diagnosis not present

## 2016-12-16 DIAGNOSIS — I83893 Varicose veins of bilateral lower extremities with other complications: Secondary | ICD-10-CM | POA: Insufficient documentation

## 2016-12-16 NOTE — Progress Notes (Signed)
Patient name: Susan Boone MRN: [id number] DOB: [birth year] Sex: female   REASON FOR CONSULT:    Bilateral varicose veins. The consult is requested by Dr. Katrinka Blazing.  HPI:   Susan Boone is a pleasant 82 y.o. female,  Who has a long history of varicose veins of both lower extremities. She underwent ablation of the left great saphenous vein 10-15 years ago by Dr. Arbie Cookey. Her main complaint now has been swelling in both lower extremities. She thinks the swelling is a little bit worse on the left side. She describes some aching pain and heaviness associated with standing and sitting that is relieved with elevation. She is unaware of any previous history of DVT or phlebitis.  I have reviewed the office note from Dr. Michaelle Copas office dated 10/02/2016. She was placed on Keflex for a recurrent UTI. She has a history of a right renal mass and is followed by urology.  Past Medical History:  Diagnosis Date  . Allergy   . Anemia   . Arthritis   . Asthma   . Esophageal reflux   . H/O orthostatic hypotension   . Kidney stones   . Migraine     Family History  Problem Relation Age of Onset  . Stroke Father   . Hyperlipidemia Father   . Lung cancer Mother   . Cancer Maternal Grandmother     SOCIAL HISTORY: Social History   Social History  . Marital status: Divorced    Spouse name: N/A  . Number of children: N/A  . Years of education: N/A   Occupational History  . retired    Social History Main Topics  . Smoking status: Never Smoker  . Smokeless tobacco: Never Used  . Alcohol use No  . Drug use: No  . Sexual activity: Not on file   Other Topics Concern  . Not on file   Social History Narrative  . No narrative on file    Allergies  Allergen Reactions  . Latex Hives    Lip swelling and redness  . Codeine   . Doxycycline   . Tape Rash    Current Outpatient Prescriptions  Medication Sig Dispense Refill  . cephALEXin (KEFLEX) 500 MG capsule Take 1 capsule (500 mg  total) by mouth at bedtime. 30 capsule 2  . nadolol (CORGARD) 80 MG tablet Take 1 tablet (80 mg total) by mouth daily. 90 tablet 1   No current facility-administered medications for this visit.     REVIEW OF SYSTEMS:   denotes positive finding,  denotes negative finding Cardiac  Comments:  Chest pain or chest pressure:    Shortness of breath upon exertion:    Short of breath when lying flat:    Irregular heart rhythm:        Vascular    Pain in calf, thigh, or hip brought on by ambulation:    Pain in feet at night that wakes you up from your sleep:     Blood clot in your veins:    Leg swelling:  X       Pulmonary    Oxygen at home:    Productive cough:     Wheezing:         Neurologic    Sudden weakness in arms or legs:     Sudden numbness in arms or legs:     Sudden onset of difficulty speaking or slurred speech:    Temporary loss of vision in one eye:  Problems with dizziness:         Gastrointestinal    Blood in stool:     Vomited blood:         Genitourinary    Burning when urinating:     Blood in urine:        Psychiatric    Major depression:         Hematologic    Bleeding problems:    Problems with blood clotting too easily:        Skin    Rashes or ulcers:        Constitutional    Fever or chills:     PHYSICAL EXAM:   Vitals:   12/16/16 1305  BP: 120/65  Pulse: 63  Temp: (!) 97.3 F (36.3 C)  TempSrc: Oral  SpO2: 97%  Weight: 210 lb (95.3 kg)  Height:  (1.575 m)    GENERAL: The patient is a well-nourished female, in no acute distress. The vital signs are documented above. CARDIAC: There is a regular rate and rhythm.  VASCULAR: I do not detect carotid bruits. On the right side I cannot palpate pedal pulses however she has a biphasic posterior tibial signal with a monophasic dorsalis pedis signal. On the left side she has a palpable dorsalis pedis pulse. She has a biphasic posterior tibial signal. She has bilateral lower  extremity swelling. She has some truncal varicosities and spider veins bilaterally. PULMONARY: There is good air exchange bilaterally without wheezing or rales. ABDOMEN: Soft and non-tender with normal pitched bowel sounds.  MUSCULOSKELETAL: There are no major deformities or cyanosis. NEUROLOGIC: No focal weakness or paresthesias are detected. SKIN: There are no ulcers or rashes noted. PSYCHIATRIC: The patient has a normal affect.  DATA:    BILATERAL LOWER EXTREMITY VENOUS DUPLEX: I have independently interpreted her bilateral lower extremity venous duplex scan.  On the right side, there is no evidence of deep venous thrombosis or superficial thrombophlebitis. There is deep venous reflux involving the common femoral vein, femoral vein, and popliteal vein. There is superficial venous reflux involving the right great saphenous vein and saphenofemoral junction.  On the left side there is no evidence of deep venous thrombosis or superficial thrombophlebitis. There is deep venous reflux involving the common femoral vein. The left great saphenous vein has been ablated. There is some reflux in the left small saphenous vein.  MEDICAL ISSUES:   CHRONIC VENOUS INSUFFICIENCY BILATERALLY: Based on her exam and duplex findings, she has evidence of chronic venous insufficiency bilaterally. She has reflux in the deep system on the right and also in the right great saphenous vein. On the left side she has reflux in the deep system and the left small saphenous vein. We have discussed the importance of intermittent leg elevation proper positioning for this. In addition I have written her a prescription for knee-high compression stockings with a gradient of 15-20 mmHg. I would not get fitted for stockings until she's been elevating her legs to try to get her swelling down. I have encouraged her to avoid prolonged sitting and standing. We also discussed the importance of exercise. I think she would be an excellent  candidate for water aerobics given her knee arthritis and weight. This is very helpful for people with chronic venous insufficiency. If her symptoms progressed then we could try her in thigh-high stockings with a gradient of 20-30 mmHg on the right side andif this were not successful, she could be considered for endovenous laser ablation of  the right great saphenous vein. However given her age I would not recommend an overly aggressive approach. I will be happy to see her back at anytime if her symptoms progress.  Waverly Ferrari [facility] [phone]

## 2016-12-24 ENCOUNTER — Ambulatory Visit (INDEPENDENT_AMBULATORY_CARE_PROVIDER_SITE_OTHER): Payer: Medicare Other

## 2016-12-24 VITALS — BP 140/84 | HR 70 | Ht 62.0 in | Wt 210.0 lb

## 2016-12-24 DIAGNOSIS — Z0000 Encounter for general adult medical examination without abnormal findings: Secondary | ICD-10-CM | POA: Diagnosis not present

## 2016-12-24 NOTE — Patient Instructions (Addendum)
Ms. Susan Boone , Thank you for taking time to come for your Medicare Wellness Visit. I appreciate your ongoing commitment to your health goals. Please review the following plan we discussed and let me know if I can assist you in the future.   Screening recommendations/referrals: Colonoscopy: stops after age 80 Mammogram: up to date, stops after age 46 Bone Density: up to date, next due 10/12/2017 Recommended yearly ophthalmology/optometry visit for glaucoma screening and checkup Recommended yearly dental visit for hygiene and checkup  Vaccinations: Influenza vaccine: up to date  Pneumococcal vaccine: up to date Tdap vaccine: up to date, next due 02/24/2023 Shingles vaccine:  Check with your pharmacy about the Shingrix vaccine  Advanced directives: Please bring a copy of your POA (Power of [place]) and/or Living Will to your next appointment.   Conditions/risks identified: Try to decrease your pain so you can start walking more daily to increase your exercise.   Next appointment: 01/04/17 @ 3 pm with Dr. Katrinka Blazing   Preventive Care 65 Years and Older, Female Preventive care refers to lifestyle choices and visits with your health care provider that can promote health and wellness. What does preventive care include?  A yearly physical exam. This is also called an annual well check.  Dental exams once or twice a year.  Routine eye exams. Ask your health care provider how often you should have your eyes checked.  Personal lifestyle choices, including:  Daily care of your teeth and gums.  Regular physical activity.  Eating a healthy diet.  Avoiding tobacco and drug use.  Limiting alcohol use.  Practicing safe sex.  Taking low-dose aspirin every day.  Taking vitamin and mineral supplements as recommended by your health care provider. What happens during an annual well check? The services and screenings done by your health care provider during your annual well check will depend on  your age, overall health, lifestyle risk factors, and family history of disease. Counseling  Your health care provider may ask you questions about your:  Alcohol use.  Tobacco use.  Drug use.  Emotional well-being.  Home and relationship well-being.  Sexual activity.  Eating habits.  History of falls.  Memory and ability to understand (cognition).  Work and work Astronomer.  Reproductive health. Screening  You may have the following tests or measurements:  Height, weight, and BMI.  Blood pressure.  Lipid and cholesterol levels. These may be checked every 5 years, or more frequently if you are over 35 years old.  Skin check.  Lung cancer screening. You may have this screening every year starting at age 53 if you have a 30-pack-year history of smoking and currently smoke or have quit within the past 15 years.  Fecal occult blood test (FOBT) of the stool. You may have this test every year starting at age 9.  Flexible sigmoidoscopy or colonoscopy. You may have a sigmoidoscopy every 5 years or a colonoscopy every 10 years starting at age 51.  Hepatitis C blood test.  Hepatitis B blood test.  Sexually transmitted disease (STD) testing.  Diabetes screening. This is done by checking your blood sugar (glucose) after you have not eaten for a while (fasting). You may have this done every 1-3 years.  Bone density scan. This is done to screen for osteoporosis. You may have this done starting at age 54.  Mammogram. This may be done every 1-2 years. Talk to your health care provider about how often you should have regular mammograms. Talk with your health care provider  about your test results, treatment options, and if necessary, the need for more tests. Vaccines  Your health care provider may recommend certain vaccines, such as:  Influenza vaccine. This is recommended every year.  Tetanus, diphtheria, and acellular pertussis (Tdap, Td) vaccine. You may need a Td booster  every 10 years.  Zoster vaccine. You may need this after age 38.  Pneumococcal 13-valent conjugate (PCV13) vaccine. One dose is recommended after age 35.  Pneumococcal polysaccharide (PPSV23) vaccine. One dose is recommended after age 67. Talk to your health care provider about which screenings and vaccines you need and how often you need them. This information is not intended to replace advice given to you by your health care provider. Make sure you discuss any questions you have with your health care provider. Document Released: 04/05/2015 Document Revised: 11/27/2015 Document Reviewed: 01/08/2015 Elsevier Interactive Patient Education  2017 [place] Prevention in the Home Falls can cause injuries. They can happen to people of all ages. There are many things you can do to make your home safe and to help prevent falls. What can I do on the outside of my home?  Regularly fix the edges of walkways and driveways and fix any cracks.  Remove anything that might make you trip as you walk through a door, such as a raised step or threshold.  Trim any bushes or trees on the path to your home.  Use bright outdoor lighting.  Clear any walking paths of anything that might make someone trip, such as rocks or tools.  Regularly check to see if handrails are loose or broken. Make sure that both sides of any steps have handrails.  Any raised decks and porches should have guardrails on the edges.  Have any leaves, snow, or ice cleared regularly.  Use sand or salt on walking paths during winter.  Clean up any spills in your garage right away. This includes oil or grease spills. What can I do in the bathroom?  Use night lights.  Install grab bars by the toilet and in the tub and shower. Do not use towel bars as grab bars.  Use non-skid mats or decals in the tub or shower.  If you need to sit down in the shower, use a plastic, non-slip stool.  Keep the floor dry. Clean up any  water that spills on the floor as soon as it happens.  Remove soap buildup in the tub or shower regularly.  Attach bath mats securely with double-sided non-slip rug tape.  Do not have throw rugs and other things on the floor that can make you trip. What can I do in the bedroom?  Use night lights.  Make sure that you have a light by your bed that is easy to reach.  Do not use any sheets or blankets that are too big for your bed. They should not hang down onto the floor.  Have a firm chair that has side arms. You can use this for support while you get dressed.  Do not have throw rugs and other things on the floor that can make you trip. What can I do in the kitchen?  Clean up any spills right away.  Avoid walking on wet floors.  Keep items that you use a lot in easy-to-reach places.  If you need to reach something above you, use a strong step stool that has a grab bar.  Keep electrical cords out of the way.  Do not use floor polish or  wax that makes floors slippery. If you must use wax, use non-skid floor wax.  Do not have throw rugs and other things on the floor that can make you trip. What can I do with my stairs?  Do not leave any items on the stairs.  Make sure that there are handrails on both sides of the stairs and use them. Fix handrails that are broken or loose. Make sure that handrails are as long as the stairways.  Check any carpeting to make sure that it is firmly attached to the stairs. Fix any carpet that is loose or worn.  Avoid having throw rugs at the top or bottom of the stairs. If you do have throw rugs, attach them to the floor with carpet tape.  Make sure that you have a light switch at the top of the stairs and the bottom of the stairs. If you do not have them, ask someone to add them for you. What else can I do to help prevent falls?  Wear shoes that:  Do not have high heels.  Have rubber bottoms.  Are comfortable and fit you well.  Are closed  at the toe. Do not wear sandals.  If you use a stepladder:  Make sure that it is fully opened. Do not climb a closed stepladder.  Make sure that both sides of the stepladder are locked into place.  Ask someone to hold it for you, if possible.  Clearly mark and make sure that you can see:  Any grab bars or handrails.  First and last steps.  Where the edge of each step is.  Use tools that help you move around (mobility aids) if they are needed. These include:  Canes.  Walkers.  Scooters.  Crutches.  Turn on the lights when you go into a dark area. Replace any light bulbs as soon as they burn out.  Set up your furniture so you have a clear path. Avoid moving your furniture around.  If any of your floors are uneven, fix them.  If there are any pets around you, be aware of where they are.  Review your medicines with your doctor. Some medicines can make you feel dizzy. This can increase your chance of falling. Ask your doctor what other things that you can do to help prevent falls. This information is not intended to replace advice given to you by your health care provider. Make sure you discuss any questions you have with your health care provider. Document Released: 01/03/2009 Document Revised: 08/15/2015 Document Reviewed: 04/13/2014 Elsevier Interactive Patient Education  2017 [employer]. 7

## 2016-12-24 NOTE — Progress Notes (Signed)
Subjective:   Susan Boone is a 82 y.o. female who presents for Medicare Annual (Subsequent) preventive examination.  Review of Systems:  N/A Cardiac Risk Factors include: advanced age (>68men, >36 women);obesity (BMI >30kg/m2)     Objective:     Vitals: BP 140/84   Pulse 70   Ht  (1.575 m)   Wt 210 lb (95.3 kg)   SpO2 97%   BMI 38.41 kg/m   Body mass index is 38.41 kg/m.   Tobacco History  Smoking Status  . Never Smoker  Smokeless Tobacco  . Never Used     Counseling given: Not Answered   Past Medical History:  Diagnosis Date  . Allergy   . Anemia   . Arthritis   . Asthma   . Esophageal reflux   . H/O orthostatic hypotension   . Kidney stones   . Migraine    Past Surgical History:  Procedure Laterality Date  . APPENDECTOMY    . AUGMENTATION MAMMAPLASTY Bilateral    40 years ago  . BREAST SURGERY    . CHOLECYSTECTOMY    . COSMETIC SURGERY    . LITHOTRIPSY    . TUBAL LIGATION     Family History  Problem Relation Age of Onset  . Stroke Father   . Hyperlipidemia Father   . Lung cancer Mother   . Cancer Maternal Grandmother    History  Sexual Activity  . Sexual activity: Not on file    Outpatient Encounter Prescriptions as of 12/24/2016  Medication Sig  . cephALEXin (KEFLEX) 500 MG capsule Take 1 capsule (500 mg total) by mouth at bedtime.  . nadolol (CORGARD) 80 MG tablet Take 1 tablet (80 mg total) by mouth daily.   No facility-administered encounter medications on file as of 12/24/2016.     Activities of Daily Living In your present state of health, do you have any difficulty performing the following activities: 12/24/2016  Hearing? N  Vision? N  Difficulty concentrating or making decisions? Y  Comment memory loss due to hours of anesthesia  Walking or climbing stairs? Y  Comment Patient has knee pain  Dressing or bathing? N  Doing errands, shopping? N  Preparing Food and eating ? N  Using the Toilet? N  In the past six  months, have you accidently leaked urine? Y  Comment Patient has to wear depends daily  Do you have problems with loss of bowel control? N  Managing your Medications? N  Managing your Finances? N  Housekeeping or managing your Housekeeping? N  Some recent data might be hidden    Patient Care Team: Ethelda Chick, MD as PCP - General (Family Medicine)    Assessment:     Exercise Activities and Dietary recommendations Current Exercise Habits: The patient does not participate in regular exercise at present, Exercise limited by: orthopedic condition(s)  Goals    . Exercise (walking)          Patient wants to try to decrease her pain so she can start walking more daily to increase her exercise.       Fall Risk Fall Risk  12/24/2016 10/02/2016 02/06/2015 04/23/2014  Falls in the past year? No No No No   Depression Screen PHQ 2/9 Scores 12/24/2016 10/02/2016 06/26/2015 05/17/2015  PHQ - 2 Score 0 0 0 0     Cognitive Function     6CIT Screen 12/24/2016  What Year? 0 points  What month? 0 points  What time? 0 points  Count back from 20 0 points  Months in reverse 0 points  Repeat phrase 0 points  Total Score 0    Immunization History  Administered Date(s) Administered  . Influenza-Unspecified 12/21/2013  . Pneumococcal Conjugate-13 02/06/2015  . Pneumococcal Polysaccharide-23 02/11/2002  . Tdap 02/23/2013   Screening Tests Health Maintenance  Topic Date Due  . TETANUS/TDAP  02/24/2023  . INFLUENZA VACCINE  Addressed  . DEXA SCAN  Completed  . PNA vac Low Risk Adult  Completed      Plan:   I have personally reviewed and noted the following in the patient's chart:   . Medical and social history . Use of alcohol, tobacco or illicit drugs  . Current medications and supplements . Functional ability and status . Nutritional status . Physical activity . Advanced directives . List of other physicians . Hospitalizations, surgeries, and ER visits in previous 12  months . Vitals . Screenings to include cognitive, depression, and falls . Referrals and appointments  In addition, I have reviewed and discussed with patient certain preventive protocols, quality metrics, and best practice recommendations. A written personalized care plan for preventive services as well as general preventive health recommendations were provided to patient.     Janalyn Shy, LPN  16/03/958

## 2016-12-31 ENCOUNTER — Telehealth: Payer: Self-pay | Admitting: Family Medicine

## 2016-12-31 DIAGNOSIS — N2889 Other specified disorders of kidney and ureter: Secondary | ICD-10-CM

## 2016-12-31 NOTE — Telephone Encounter (Signed)
Susan Boone from [facility] called requesting the order for CT Abdomen Pelvis with and without to be changed to a CT Abdomen with and without per radiologist protocol. This is IMG 2404. Susan Boone can be reached at [phone]. Thanks!

## 2017-01-04 ENCOUNTER — Ambulatory Visit: Payer: Medicare Other | Admitting: Family Medicine

## 2017-01-05 ENCOUNTER — Ambulatory Visit: Payer: Medicare Other | Admitting: Family Medicine

## 2017-01-09 ENCOUNTER — Other Ambulatory Visit: Payer: Self-pay | Admitting: Family Medicine

## 2017-01-09 DIAGNOSIS — G43809 Other migraine, not intractable, without status migrainosus: Secondary | ICD-10-CM

## 2017-03-11 ENCOUNTER — Ambulatory Visit (INDEPENDENT_AMBULATORY_CARE_PROVIDER_SITE_OTHER): Payer: Medicare Other | Admitting: Family Medicine

## 2017-03-11 ENCOUNTER — Encounter: Payer: Self-pay | Admitting: Family Medicine

## 2017-03-11 ENCOUNTER — Other Ambulatory Visit: Payer: Self-pay

## 2017-03-11 VITALS — BP 120/62 | HR 68 | Temp 97.8°F | Resp 16 | Ht 62.0 in | Wt 212.8 lb

## 2017-03-11 DIAGNOSIS — R300 Dysuria: Secondary | ICD-10-CM

## 2017-03-11 DIAGNOSIS — Z87442 Personal history of urinary calculi: Secondary | ICD-10-CM

## 2017-03-11 DIAGNOSIS — N390 Urinary tract infection, site not specified: Secondary | ICD-10-CM | POA: Diagnosis not present

## 2017-03-11 DIAGNOSIS — N2889 Other specified disorders of kidney and ureter: Secondary | ICD-10-CM | POA: Diagnosis not present

## 2017-03-11 DIAGNOSIS — G43809 Other migraine, not intractable, without status migrainosus: Secondary | ICD-10-CM

## 2017-03-11 LAB — POCT URINALYSIS DIP (MANUAL ENTRY)
Bilirubin, UA: NEGATIVE
GLUCOSE UA: NEGATIVE mg/dL
Ketones, POC UA: NEGATIVE mg/dL
Nitrite, UA: NEGATIVE
Protein Ur, POC: NEGATIVE mg/dL
Spec Grav, UA: 1.025 (ref 1.010–1.025)
UROBILINOGEN UA: 0.2 U/dL
pH, UA: 5 (ref 5.0–8.0)

## 2017-03-11 LAB — POC MICROSCOPIC URINALYSIS (UMFC): MUCUS RE: ABSENT

## 2017-03-11 MED ORDER — NADOLOL 80 MG PO TABS: 80.0000 mg | ORAL_TABLET | Freq: Every day | ORAL | 1 refills | Status: DC

## 2017-03-11 MED ORDER — CEPHALEXIN 500 MG PO CAPS: 500.0000 mg | ORAL_CAPSULE | Freq: Every day | ORAL | 1 refills | Status: DC

## 2017-03-11 NOTE — Patient Instructions (Addendum)
Dr. Wilburn MylarElizabeth Boone is at:  [facility] [address] [place], [place] [id number]  [email or web address] Monday ~ Thursday : 8:00 AM ~ 5:00 PM Friday : 8:00 AM ~ 3:00 PM Phone: [phone]  I will place a referral back to there for you.      IF you received an x-ray today, you will receive an invoice from [facility]. Please contact [facility] at [phone] with questions or concerns regarding your invoice.   IF you received labwork today, you will receive an invoice from [facility]. Please contact [facility] at [phone] with questions or concerns regarding your invoice.   Our billing staff will not be able to assist you with questions regarding bills from these companies.  You will be contacted with the lab results as soon as they are available. The fastest way to get your results is to activate your My Chart account. Instructions are located on the last page of this paperwork. If you have not heard from us regarding the results in 2 weeks, please contact this office.

## 2017-03-11 NOTE — Progress Notes (Signed)
Subjective:    Patient ID: Susan Boone, female    DOB: 1934/06/05, 82 y.o.   MRN: [id number] Chief Complaint  Patient presents with  . Follow-up    3 month   . Medication Refill    cephalexin, corgard    HPI  Ms. Susan Boone is an 82 yo female here for a routine 5 mo f/u on her chronic medical conditions.  I have seen her sev years before for acute issues but she did not want to wait for her next PCP (Dr. Katrinka BlazingSmith) open appt to be seen as after [place]. She had her AWV 2 mos prior - 12/24/2016 but has not had a CPE or f/u from that with MD/PCP yet but pt plans to sched that.  Recurrent UTI: urology placed on one Keflex daily; desires to stay on forever; no recurrent UTI since suppression started so she is "desparate to continue it".   Does not desire surgical correction EVER as will never undergo general anesthesia again.  Rt renal mass: 3cm - has been followed by urology Dr. Wilburn [facility]ylarElizabeth Albertson at [facility] in [place] since 04/2014 q493mos for close watchful waiting until Dr. Beverely PaceAlbertson resigned this past summer so she is now ~6 mos out from her last urology visit for this.  Pt has been recommended to have the mass removed but declined as would like to do whatever she possibly can to avoid undergoing general anesthesia - REALLY concerned about suffering more cognitive loss from GA as she perceives she suffered in 2013 during 4+hr kidney stone surgery.  She has been under the care of a handful of other urologists at different practices prior but never felt heard or on the same page w/ mngmnt plan/approach (inc 2 at [employer], sev at [facility]) until meeting Dr. Beverely PaceAlbertson so is VERY anxious to find her urologist again who she suspects has relocated to another practice in the area but all googling still reveals [place] still [place] [facility] [place].   Had Rt renal US to eval mass in June but pt reports it was not as detailed as needed and so told she would need abd/pelvis CT for further mass  characterization but then has not been able to find her urologist so she can f/u and have this done. Dr. Katrinka BlazingSmith, PCP, offered to order the rec CT but pt declined - preferring to wait until she was able to f/u with Dr. Beverely PaceAlbertson for this.  H/o Nephrolithiasis: Continues to have recurrent problems, impressively severe - largely in 2013- she is now understandably hypervigilant for any sign of recurrent and wants her urology provider to behave similarly. Hopes proactive management as she has found her most recent urologist Dr. Beverely PaceAlbertson provides, will help her avoid poss need for hosp/surgery.  H/o Urge incontinence   Fatigue/hypersomnolence: chronic issue that interferes with quality of life.   Arthralgias: knees/hips/lower back:  Alusio/hip and knee. Willing to undergo injections but will not consider arthroscopy or the rec TKR due to unwillingness to undergo general anesthesia.  Feet and leg swelling, Lt>Rt; varicose veins; blue.  Previous evaluation Dr. Arbie CookeyEarly of vein specialist.  B breast implant changes/implant mvmnt: [facility]. Noted mvmnt of Lt subpectoral silicone implant ~05/2016 so she saw Dr. Leta Baptisthimmappa 4 mos ago w/ diag mam and B breast US on 11/25/16 that showed B intra and extracapsular rupture Lt>Rt. Recommended to have "removal and capsulectomies as progression of extracapsular rupture will form multiple granulomas, making future clinical exams difficult" but  pt adamant that she is unwilling to consider undergoing general anesthesia for this due to memory loss/cognitive dysfxn she continues to suffer from since she had to undergo an extensive hrs long emergent percutaneous  for nephrolithiasis in 2013. +FHx of breast cancer in her mother in her 93s.  IBS-D: Chronic since gallbladder out.    Nasal skin lesion- seen by Dr. Campbell Stall in past at [facility] - plastics rec she return to her derm for eval/bx and Mohs if bx + due to location.   Happy to place referral if pt needs new.  Migraines: effectively prophylaxed on nadolol for many years (>5 yrs)   Past Medical History:  Diagnosis Date  . Allergy - environmental   . Anemia   . Arthritis   . Asthma   . Esophageal reflux   . H/O orthostatic hypotension   . 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 Migraine    Past Surgical History:  Procedure Laterality Date  . APPENDECTOMY    . AUGMENTATION MAMMAPLASTY Bilateral    40 years ago  . BREAST SURGERY    . CHOLECYSTECTOMY  1986  . COSMETIC SURGERY    . LITHOTRIPSY - B  2006  . TUBAL LIGATION  1968  Rt ureteroscopic stone removal 12/17/2011 - Dr. Karena Addison at [facility] Percutaneous nephrostolithotomy w/ cystoscopy, ureteroscopy and Lt stone manipulation 06/30/2011 and repeat 07/07/11 - Dr. Karena Addison at [facility] Endovenous ablation 2005  No current outpatient medications on file prior to visit.   No current facility-administered medications on file prior to visit.    Allergies  Allergen Reactions  . Latex Hives    Lip swelling and redness  . Codeine   . Doxycycline   . Tape Rash   Family History  Problem Relation Age of Onset  . Stroke Father   . Hyperlipidemia Father   . Lung cancer Mother   . Cancer Maternal Grandmother    Social History   Socioeconomic History  . Marital status: Divorced    Spouse name: None  . Number of children: None  . Years of education: None  . Highest education level: None  Social Needs  . Financial resource strain: None  . Food insecurity - worry: None  . Food insecurity - inability: None  . Transportation needs - medical: None  .  Transportation needs - non-medical: None  Occupational History  . Occupation: retired  Tobacco Use  . Smoking status: Never Smoker  . Smokeless tobacco: Never Used  Substance and Sexual Activity  . Alcohol use: No    Alcohol/week: 0.0 oz  . Drug use: No  . Sexual activity: None  Other Topics Concern  . None  Social History Narrative  . None   Depression screen [facility] 2/9 03/11/2017 12/24/2016 10/02/2016 06/26/2015 05/17/2015  Decreased Interest 0 0 0 0 0  Down, Depressed, Hopeless 0 0 0 0 0  PHQ - 2 Score 0 0 0 0 0    Review of Systems See hpi    Objective:   Physical Exam  Constitutional: She is oriented to person, place, and time. She appears well-developed and well-nourished. No distress.  HENT:  Head: Normocephalic and atraumatic.  Right  Ear: External ear normal.  Left Ear: External ear normal.  Eyes: Conjunctivae are normal. No scleral icterus.  Neck: Normal range of motion. Neck supple. No thyromegaly present.  Cardiovascular: Normal rate, regular rhythm, normal heart sounds and intact distal pulses.  Pulmonary/Chest: Effort normal and breath sounds normal. No respiratory distress.  Abdominal: Soft. Bowel sounds are normal. There is no CVA tenderness.  Musculoskeletal: She exhibits no edema.  Lymphadenopathy:    She has no cervical adenopathy.  Neurological: She is alert and oriented to person, place, and time.  Skin: Skin is warm and dry. She is not diaphoretic. No erythema.  Psychiatric: She has a normal mood and affect. Her behavior is normal.      BP 120/62   Pulse 68   Temp 97.8 F (36.6 C)   Resp 16   Ht 5\' 2"  (1.575 m)   Wt 212 lb 12.8 oz (96.5 kg)   SpO2 98%   BMI 38.92 kg/m   POCT urinalysis dipstick  Result Value Ref Range   Color, UA yellow yellow   Clarity, UA cloudy (A) clear   Glucose, UA negative negative mg/dL   Bilirubin, UA negative negative   Ketones, POC UA negative negative mg/dL   Spec Grav, UA 1.6101.025 9.6041.010 - 1.025   Blood, UA  trace-intact (A) negative   pH, UA 5.0 5.0 - 8.0   Protein Ur, POC negative negative mg/dL   Urobilinogen, UA 0.2 0.2 or 1.0 E.U./dL   Nitrite, UA Negative Negative   Leukocytes, UA Small (1+) (A) Negative  POCT Microscopic Urinalysis ([facility])  Result Value Ref Range   WBC,UR,HPF,POC Too numerous to count  (A) None WBC/hpf   RBC,UR,HPF,POC None None RBC/hpf   Bacteria Many (A) None, Too numerous to count   Mucus Absent Absent   Epithelial Cells, UR Per Microscopy Few (A) None, Too numerous to count cells/hpf       Assessment & Plan:  She will make a f/u appointment w/ myself or Dr. Katrinka BlazingSmith to review other chronic medical problems and her recent referrals/specialists appts.   1. Recurrent UTI - recurrence effectively prevented by daily prophylactic Keflex so refilled. Rec continuing as pt has an impressively extensive hx of numerous large staghorn kidney stones that can block ureters - cont so daily antibiotic suppresion to decrease life-threatning risk of infected stone. Pt currently asymptomatic but I am concerned about lg wbc and bacteria seen on urine micro - clx P.  2. Dysuria   3. Other migraine without status migrainosus, not intractable - effectively prevented with nadolol, stable for years  4. Right renal mass - 3-6 mos overdue for f/u CT scan of mass (pt declined to do through us - chooses to wait to urology f/u for it to be ordered) - I was able to find that her urologist Dr. Wilburn MylarElizabeth Albertson has now established at [facility] so referral placed and pt given that practice contact info.  5. NEPHROLITHIASIS, HX OF - reassuring no blood in urine  6.      Nasal skin lesion - address at f/u - does pt need derm referral? 7.      B breast implant intra- and extra-capsular rupture - Lt>Rt - pt refusing the recommended surg for removal due to risk of further cognitive loss with undergoing gen anesthesia - plastics, Dr. Leta Baptisthimmappa, noted that w/o implant removal pt will dev multiple  breast masses from extracapsular rupture which will sig impaire future clinical cancer screening exams which is concerning w/  FMHx/o breast cancer in her 72s yo mother.  Orders Placed This Encounter  Procedures  . Urine Culture  . Ambulatory referral to Urogynecology    Referral Priority:   Routine    Referral Type:   Consultation    Referral Reason:   Specialty Services Required    Requested Specialty:   Urology    Number of Visits Requested:   1  . POCT urinalysis dipstick  . POCT Microscopic Urinalysis ([facility])    Meds ordered this encounter  Medications  . cephALEXin (KEFLEX) 500 MG capsule    Sig: Take 1 capsule (500 mg total) by mouth at bedtime.    Dispense:  90 capsule    Refill:  1  . nadolol (CORGARD) 80 MG tablet    Sig: Take 1 tablet (80 mg total) by mouth daily.    Dispense:  90 tablet    Refill:  1    I personally performed the services described in this documentation, which was scribed in my presence. The recorded information has been reviewed and considered, and addended by me as needed.   Norberto Sorenson, M.D.  Primary Care at [facility] [address] [place], [place] [address] [phone] phone [phone] fax  03/13/17 9:37 PM

## 2017-03-13 ENCOUNTER — Encounter: Payer: Self-pay | Admitting: Family Medicine

## 2017-03-13 LAB — URINE CULTURE

## 2017-03-13 MED ORDER — NITROFURANTOIN MONOHYD MACRO 100 MG PO CAPS: 100.0000 mg | ORAL_CAPSULE | Freq: Two times a day (BID) | ORAL | 0 refills | Status: DC

## 2017-04-05 ENCOUNTER — Ambulatory Visit: Payer: Medicare Other | Admitting: Family Medicine

## 2017-04-08 ENCOUNTER — Encounter: Payer: Self-pay | Admitting: Family Medicine

## 2017-04-08 ENCOUNTER — Telehealth: Payer: Self-pay | Admitting: Family Medicine

## 2017-04-08 ENCOUNTER — Ambulatory Visit (INDEPENDENT_AMBULATORY_CARE_PROVIDER_SITE_OTHER): Payer: Medicare Other | Admitting: Family Medicine

## 2017-04-08 ENCOUNTER — Other Ambulatory Visit: Payer: Self-pay

## 2017-04-08 VITALS — BP 110/66 | HR 65 | Temp 97.7°F | Resp 16 | Wt 213.6 lb

## 2017-04-08 DIAGNOSIS — J3489 Other specified disorders of nose and nasal sinuses: Secondary | ICD-10-CM

## 2017-04-08 DIAGNOSIS — E559 Vitamin D deficiency, unspecified: Secondary | ICD-10-CM | POA: Diagnosis not present

## 2017-04-08 DIAGNOSIS — N390 Urinary tract infection, site not specified: Secondary | ICD-10-CM

## 2017-04-08 DIAGNOSIS — G629 Polyneuropathy, unspecified: Secondary | ICD-10-CM

## 2017-04-08 LAB — POCT URINALYSIS DIP (MANUAL ENTRY)
BILIRUBIN UA: NEGATIVE
BILIRUBIN UA: NEGATIVE mg/dL
Blood, UA: NEGATIVE
GLUCOSE UA: NEGATIVE mg/dL
NITRITE UA: NEGATIVE
Protein Ur, POC: NEGATIVE mg/dL
Spec Grav, UA: 1.02 (ref 1.010–1.025)
Urobilinogen, UA: 0.2 E.U./dL
pH, UA: 5 (ref 5.0–8.0)

## 2017-04-08 LAB — POC MICROSCOPIC URINALYSIS (UMFC): Mucus: ABSENT

## 2017-04-08 NOTE — Patient Instructions (Addendum)
Ask for a call back or visit [facility] to discuss the details of the hip injection with the provider you saw - I think it will be WELL worth you figuring out how you can get this done without to much risk from the sedation as it could improve your vasculature, weight, pain, AND brain.  We will call you to let you know whether you need to take the new antibiotic (nitrofurantoin) or if it is ok to just stay on the keflex.  It might be interesting for you to have a visit with the [facility] Neuro[facility] Dr. Rolly PancakeBaylor if you were interested in discussing the finer points of your memory changes but it seems all the expected changes with aging and the inevitable grey matter loss everyone experiences. Let me know if you want a referral.   IF you received an x-ray today, you will receive an invoice from [facility]. Please contact [facility] at [phone] with questions or concerns regarding your invoice.   IF you received labwork today, you will receive an invoice from [facility]. Please contact [facility] at [phone] with questions or concerns regarding your invoice.   Our billing staff will not be able to assist you with questions regarding bills from these companies.  You will be contacted with the lab results as soon as they are available. The fastest way to get your results is to activate your My Chart account. Instructions are located on the last page of this paperwork. If you have not heard from us regarding the results in 2 weeks, please contact this office.

## 2017-04-08 NOTE — Progress Notes (Addendum)
Subjective:  By signing my name below, I, Stann Ore, attest that this documentation has been prepared under the direction and in the presence of Norberto Sorenson, MD. Electronically Signed: Stann Ore, Scribe. 04/08/2017 , 3:32 PM .  Patient was seen in Room 2 .   Patient ID: Susan Boone, female    DOB: 07/08/1934, 83 y.o.   MRN: [id number] Chief Complaint  Patient presents with  . Follow-up    concerned about the REFERRAL if results are in the chart   HPI Susan Boone is a 83 y.o. female who presents to Primary Care at [facility] for follow up. Patient was previously seen on Dec 20 for recurrent UTI  and her once daily prophylactic keflex was refilled x 6 mos which she had a friend pick up. However, the next day, patient received a call from the pharmacy saying there's a prescription needed to be picked up. Patient informed them that she already received it but the pharmacy was persistent, calling her daily. Patient eventually went to the pharmacy, paid her copay and got the additional prescription of nitrofurantoin, although she didn't take it as she didn't know why it was sent in for her. Patient states she didn't have any symptoms with just Keflex because she drinks plenty of liquids throughout the day daily. She denies having any further symptoms.   Her urine culture at last visit showed a poss UTI but small quant bacteria (25-50K Enterobacter) that was resistant to Keflex (Augmentin and all cephalosporins, ertapenem, levofloxacin) so I had sent in the rx for macrobid and sent pt the results on MyChart with a detailed note explaining why she was instructed to take nitrofurantoin BID 10 day course. However, patient had MyChart technical difficulties and so can't open MyChart on her tablet at all despite spending significant time trouble shooting with multiple [employer] g[employer].   Patient states having an appointment with urologist, Dr. Beverely Pace on Jan 28th who has moved practices and  is now at [facility].   She also hasn't heard back from referral to dermatology. She has an area that's still pink over her nose. She has a history of basal cell in the left crease at the base of her nose.   She mentions having some memory loss, in terms of vocabulary, believes due to age. She has taken a memory test prior, but passed with flying colors. She hasn't tried memory games.   6CIT Screen 12/24/2016  What Year? 0 points  What month? 0 points  What time? 0 points  Count back from 20 0 points  Months in reverse 0 points  Repeat phrase 0 points  Total Score 0   She also mentions having some arthralgia, and wanted to receive some injections. When she called up the orthopedics office, they informed her that the table is elevated and patient probably can't get onto the table. They wanted to refer her to surgery, and change the sedation to have it done. Her legs become easily tired and cramp up, into her knees and frequently into her calves mostly with activity. She has compression stockings, and is able to wear it over her left leg but unable to wear it in her right leg.   She wakes up about x5 a night for nocturia. She sleeps with her feet elevated, but doesn't take long for her feet to swell up again. She mentions having some numbness in her feet. She's been followed by vascular surgeon, Dr. Edilia Bo.   Past Medical  History:  Diagnosis Date  . Allergy    environmental  . Anemia   . Arthritis   . Asthma   . Chronic diarrhea 1986   since cholecystecomy  . Esophageal reflux   . H/O orthostatic hypotension   . 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 Migraine   . Urge incontinence of urine    followed by Urology - Dr. Wilburn Mylar   Past Surgical History:  Procedure Laterality Date  . ABLATION  2005   endovenous  . APPENDECTOMY    . AUGMENTATION MAMMAPLASTY Bilateral 1971   [facility] Dr. Tyrell Antonio  . CHOLECYSTECTOMY  1986   chronic diarrhea since  . COSMETIC SURGERY    . LITHOTRIPSY Bilateral 2006  . PERCUTANEOUS NEPHROSTOLITHOTOMY Left 06/30/2011   w/ cystoscopy by [facility] Karena Addison at [facility] (and poss ureteral stent placement?)  . PERCUTANEOUS NEPHROSTOLITHOTOMY Left 07/07/2011   w/ ureteroscopy and stone manipulation Dr. Karena Addison at [facility]. Surg >4 hrs with gen anesthesia and pt noticed new permanent cognitive deficits after  . TUBAL LIGATION Bilateral 1968  . URETEROLITHOTOMY Right 12/17/2011   Dr. Karena Addison at [facility]  . URETEROSCOPY WITH HOLMIUM LASER LITHOTRIPSY Left 07/07/2011   by percutaneous nephrostolithotomy with basket extraction, ureteral stent removal (placed 06/30/11?), and placement of Lt percutaneous nephrostomy tube by Dr. Karena Addison at [facility] for numerous >2cm staghorn calcium oxalate stones in left renal pelvis retained after initial perc nephrostolithotomy wiht stent placement 1 wk prior    Prior to Admission medications   Medication Sig Start Date End Date Taking? Authorizing Provider  cephALEXin (KEFLEX) 500 MG capsule Take 1 capsule (500 mg total) by mouth at bedtime. 03/11/17   Sherren Mocha, MD  nadolol (CORGARD) 80 MG tablet Take 1 tablet (80 mg total) by mouth daily. 03/11/17   Sherren Mocha, MD  nitrofurantoin, macrocrystal-monohydrate, (MACROBID) 100 MG capsule Take 1 capsule (100 mg total) by mouth 2 (two) times daily. 03/13/17   Sherren Mocha, MD   Allergies  Allergen Reactions  . Latex Hives    Lip swelling and redness  . Codeine   . Doxycycline   . Tape Rash   Family History  Problem Relation Age of Onset  . Stroke Father   . Hyperlipidemia Father   . Lung cancer Mother    . Cancer Maternal Grandmother    Social History   Socioeconomic History  . Marital status: Divorced    Spouse name: None  . Number of children: None  . Years of education: None  . Highest education level: None  Social Needs  . Financial resource strain: None  . Food insecurity - worry: None  . Food insecurity - inability: None  . Transportation needs - medical: None  . Transportation needs - non-medical: None  Occupational History  . Occupation: retired  Tobacco Use  . Smoking status: Never Smoker  . Smokeless tobacco: Never Used  Substance and Sexual Activity  . Alcohol use: No    Alcohol/week: 0.0 oz  . Drug use: No  . Sexual activity: None  Other Topics Concern  . None  Social History Narrative  . None   Depression screen [facility] 2/9  04/08/2017 03/11/2017 12/24/2016 10/02/2016 06/26/2015  Decreased Interest 0 0 0 0 0  Down, Depressed, Hopeless 0 0 0 0 0  PHQ - 2 Score 0 0 0 0 0    Review of Systems  Constitutional: Negative for chills, fatigue, fever and unexpected weight change.  Respiratory: Negative for cough.   Gastrointestinal: Negative for constipation, diarrhea, nausea and vomiting.  Skin: Negative for rash and wound.  Neurological: Negative for dizziness, weakness and headaches.       Objective:   Physical Exam  Constitutional: She is oriented to person, place, and time. She appears well-developed and well-nourished. No distress.  HENT:  Head: Normocephalic and atraumatic.  Eyes: EOM are normal. Pupils are equal, round, and reactive to light.  Neck: Neck supple.  Cardiovascular: Normal rate.  Pulmonary/Chest: Effort normal. No respiratory distress.  Musculoskeletal: Normal range of motion.  Neurological: She is alert and oriented to person, place, and time.  Skin: Skin is warm and dry.  Lower extremities: 2+ pitting edema with decreased cap refill 4 seconds, and discolored purple skin  Psychiatric: She has a normal mood and affect. Her behavior is  normal.  Nursing note and vitals reviewed.   BP 110/66 (BP Location: Left Arm, Patient Position: Sitting, Cuff Size: Large)   Pulse 65   Temp 97.7 F (36.5 C) (Oral)   Resp 16   Wt 213 lb 9.6 oz (96.9 kg)   SpO2 96%   BMI 39.07 kg/m   Results for orders placed or performed in visit on 04/08/17  POCT urinalysis dipstick  Result Value Ref Range   Color, UA yellow yellow   Clarity, UA clear clear   Glucose, UA negative negative mg/dL   Bilirubin, UA negative negative   Ketones, POC UA negative negative mg/dL   Spec Grav, UA 1.6101.020 9.6041.010 - 1.025   Blood, UA negative negative   pH, UA 5.0 5.0 - 8.0   Protein Ur, POC negative negative mg/dL   Urobilinogen, UA 0.2 0.2 or 1.0 E.U./dL   Nitrite, UA Negative Negative   Leukocytes, UA Small (1+) (A) Negative  POCT Microscopic Urinalysis ([facility])  Result Value Ref Range   WBC,UR,HPF,POC Many (A) None WBC/hpf   RBC,UR,HPF,POC Few (A) None RBC/hpf   Bacteria None None, Too numerous to count   Mucus Absent Absent   Epithelial Cells, UR Per Microscopy Moderate (A) None, Too numerous to count cells/hpf       Assessment & Plan:   1. Recurrent UTI - never took the nitrofurantoin, still on daily keflex for prophylaxis but last UClx showed incidental UTI that was resistent so recollect UA/UClx today to decide whether still needs to do the nitrofurantoin  2. Nasal lesion - had basal cell 1-2 cm away - refer back to derm  3. Neuropathy   4. Vitamin D deficiency     Orders Placed This Encounter  Procedures  . Urine Culture  . VITAMIN D 25 Hydroxy (Vit-D Deficiency, Fractures)  . Vitamin B12  . Ambulatory referral to Dermatology    Referral Priority:   Routine    Referral Type:   Consultation    Referral Reason:   Specialty Services Required    Requested Specialty:   Dermatology    Number of Visits Requested:   1  . POCT urinalysis dipstick  . POCT Microscopic Urinalysis ([facility])   Over 40 min spent in face-to-face evaluation of and  consultation with patient and coordination of care.  Over 50% of this time was spent counseling  this patient regarding potential of right hip injection - importance of pursuing, helping sign up for MyChart.   I personally performed the services described in this documentation, which was scribed in my presence. The recorded information has been reviewed and considered, and addended by me as needed.   Norberto Sorenson, M.D.  Primary Care at [facility] [address] [place], [place] [address] [phone] phone [phone] fax  04/11/17 11:18 AM

## 2017-04-08 NOTE — Telephone Encounter (Signed)
Pt was checking out from appt on 04/08/17. She wanted to know if she needed to make a follow up appt. I was unable to clarify while pt was still here. I told her I will put a message in and if we need to follow up we will call her to schedule. If pt does not answer, please leave message to schedule if necessary. Thanks!

## 2017-04-09 ENCOUNTER — Telehealth: Payer: Self-pay | Admitting: *Deleted

## 2017-04-09 ENCOUNTER — Telehealth: Payer: Self-pay | Admitting: Family Medicine

## 2017-04-09 LAB — VITAMIN D 25 HYDROXY (VIT D DEFICIENCY, FRACTURES): VIT D 25 HYDROXY: 15.5 ng/mL — AB (ref 30.0–100.0)

## 2017-04-09 LAB — VITAMIN B12: Vitamin B-12: 230 pg/mL — ABNORMAL LOW (ref 232–1245)

## 2017-04-09 NOTE — Telephone Encounter (Signed)
[name] had an office 12/14/16. The doctor wanted to schedule her for injection of the hip, but patient declined. This was the only OV she has with the practice.

## 2017-04-09 NOTE — Telephone Encounter (Signed)
Copied from CRM [phone]. Topic: General - Other >> Apr 09, 2017 10:22 AM Cecelia ByarsGreen, Temeka L, RMA wrote: Reason for CRM: Patient is calling to notify Dr. Clelia CroftShaw to please not communicate with her on MyChart because she is not able to open her MYchart

## 2017-04-10 LAB — URINE CULTURE

## 2017-04-10 NOTE — Telephone Encounter (Signed)
Yes, it was a misunderstanding - pt DOES want to schedule but has some questions about the sedation with it and where it is to be done and couldn't get anyone to answer her questions - was hoping we could facilitate by at least getting a copy of the 12/14/16 OV to see what precisely was recommended.

## 2017-04-10 NOTE — Telephone Encounter (Signed)
F/u in 3 months. Thanks.

## 2017-04-12 NOTE — Telephone Encounter (Signed)
Called pt to make an appt for a F/U with Clelia CroftShaw per notes. No answer - left message for her to call in and make an OV with Clelia CroftShaw in 3 months.  Thanks!

## 2017-04-14 NOTE — Telephone Encounter (Signed)
Contacted [facility] to request the progress note from 12/14/16.  I will put it in your box when it comes in.

## 2017-04-28 ENCOUNTER — Telehealth: Payer: Self-pay | Admitting: Family Medicine

## 2017-04-28 DIAGNOSIS — N390 Urinary tract infection, site not specified: Secondary | ICD-10-CM

## 2017-04-28 DIAGNOSIS — I10 Essential (primary) hypertension: Secondary | ICD-10-CM

## 2017-04-28 DIAGNOSIS — E538 Deficiency of other specified B group vitamins: Secondary | ICD-10-CM

## 2017-04-28 DIAGNOSIS — Z87442 Personal history of urinary calculi: Secondary | ICD-10-CM

## 2017-04-28 DIAGNOSIS — E559 Vitamin D deficiency, unspecified: Secondary | ICD-10-CM

## 2017-04-28 DIAGNOSIS — N281 Cyst of kidney, acquired: Secondary | ICD-10-CM

## 2017-04-28 NOTE — Telephone Encounter (Signed)
Copied from CRM [id number]. Topic: Quick Communication - See Telephone Encounter >> Apr 28, 2017  1:27 PM Landry MellowFoltz, Melissa J wrote: CRM for notification. See Telephone encounter for:   04/28/17. Allie from [facility] is calling - wants to know if pt's pcp can put in order for BMP. Pt is having CT of abdomen and pelvis 05/06/17. The procedure is ordered by urologist Dr Beverely PaceAlbertson. Pease call [phone]. They are also faxing the order over as well.

## 2017-05-03 NOTE — Telephone Encounter (Signed)
FUTURE Order for BMET placed; please advise patient.

## 2017-05-03 NOTE — Telephone Encounter (Signed)
Order pended, provider please advise.

## 2017-05-03 NOTE — Telephone Encounter (Signed)
Copied from CRM [id number]. Topic: Quick Communication - See Telephone Encounter >> May 03, 2017 12:01 PM Moton, Bonnie BraeKelly, [place] wrote: Patient calling to check the status of her BMI Labs being ordered from her pcp that her ob was wanting her to have done. Stated that she will be having a CT Scan done this week and she needs to have the labs done before she gets the scan done. If someone could give her a call back about this at [phone]

## 2017-05-04 NOTE — Telephone Encounter (Signed)
Copied from CRM [id number]. Topic: Quick Communication - Lab Results >> May 03, 2017  6:33 PM Stephannie LiSimmons, Janett L, NT wrote: She would like to know the status of her BMI labs order please call  [phone]

## 2017-05-04 NOTE — Telephone Encounter (Signed)
Phone call to patient. Shizuko notified labwork orders have been placed for her. She verbalizes understanding. She states those results will need to be sent to Dr. Beverely PaceAlbertson as soon as they are resulted - labs are needed for CT exam. Provider, FYI.

## 2017-05-04 NOTE — Telephone Encounter (Signed)
Allie from [facility] called to see if the pt has had her bmp drawn. Allie's contact number is [phone]

## 2017-05-05 ENCOUNTER — Ambulatory Visit: Payer: Medicare Other

## 2017-05-05 ENCOUNTER — Telehealth: Payer: Self-pay | Admitting: Family Medicine

## 2017-05-05 DIAGNOSIS — Z87442 Personal history of urinary calculi: Secondary | ICD-10-CM

## 2017-05-05 DIAGNOSIS — I10 Essential (primary) hypertension: Secondary | ICD-10-CM

## 2017-05-05 DIAGNOSIS — N390 Urinary tract infection, site not specified: Secondary | ICD-10-CM

## 2017-05-05 DIAGNOSIS — N281 Cyst of kidney, acquired: Secondary | ICD-10-CM

## 2017-05-05 DIAGNOSIS — E559 Vitamin D deficiency, unspecified: Secondary | ICD-10-CM | POA: Insufficient documentation

## 2017-05-05 DIAGNOSIS — E538 Deficiency of other specified B group vitamins: Secondary | ICD-10-CM | POA: Insufficient documentation

## 2017-05-05 LAB — POCT URINALYSIS DIP (MANUAL ENTRY)
BILIRUBIN UA: NEGATIVE
GLUCOSE UA: NEGATIVE mg/dL
Ketones, POC UA: NEGATIVE mg/dL
NITRITE UA: NEGATIVE
Protein Ur, POC: 100 mg/dL — AB
Spec Grav, UA: 1.02 (ref 1.010–1.025)
Urobilinogen, UA: 0.2 E.U./dL
pH, UA: 5.5 (ref 5.0–8.0)

## 2017-05-05 LAB — CBC WITH DIFFERENTIAL/PLATELET
BASOS ABS: 0 10*3/uL (ref 0.0–0.2)
Basos: 0 %
EOS (ABSOLUTE): 0.2 10*3/uL (ref 0.0–0.4)
Eos: 4 %
Hematocrit: 41 % (ref 34.0–46.6)
Hemoglobin: 13.8 g/dL (ref 11.1–15.9)
LYMPHS: 37 %
Lymphocytes Absolute: 2 10*3/uL (ref 0.7–3.1)
MCH: 27.4 pg (ref 26.6–33.0)
MCHC: 33.7 g/dL (ref 31.5–35.7)
MCV: 82 fL (ref 79–97)
MONOCYTES: 9 %
Monocytes Absolute: 0.5 10*3/uL (ref 0.1–0.9)
NEUTROS ABS: 2.8 10*3/uL (ref 1.4–7.0)
Neutrophils: 50 %
Platelets: 187 10*3/uL (ref 150–379)
RBC: 5.03 x10E6/uL (ref 3.77–5.28)
RDW: 14.8 % (ref 12.3–15.4)
WBC: 5.5 10*3/uL (ref 3.4–10.8)

## 2017-05-05 LAB — BASIC METABOLIC PANEL
BUN/Creatinine Ratio: 15 (ref 12–28)
BUN: 12 mg/dL (ref 8–27)
CALCIUM: 9.3 mg/dL (ref 8.7–10.3)
CO2: 27 mmol/L (ref 20–29)
CREATININE: 0.81 mg/dL (ref 0.57–1.00)
Chloride: 105 mmol/L (ref 96–106)
GFR calc Af Amer: 79 mL/min/{1.73_m2} (ref >59)
GFR, EST NON AFRICAN AMERICAN: 69 mL/min/{1.73_m2} (ref >59)
GLUCOSE: 128 mg/dL — AB (ref 65–99)
Potassium: 3.9 mmol/L (ref 3.5–5.2)
Sodium: 140 mmol/L (ref 134–144)

## 2017-05-05 MED ORDER — VITAMIN D (ERGOCALCIFEROL) 1.25 MG (50000 UNIT) PO CAPS: 50000.0000 [IU] | ORAL_CAPSULE | ORAL | 0 refills | Status: DC

## 2017-05-05 NOTE — Telephone Encounter (Signed)
Alisha from [facility] called in with CBC and BMP results.   She will fax the results to [phone] and put it to the attention of the flow coordinator.  I routed a note to [facility] at [facility] making them aware of the incoming fax with stat labs on it.

## 2017-05-05 NOTE — Telephone Encounter (Signed)
Please fax [facility] ATTN: Dr. Beverely PaceAlbertson at [phone] today 2/13 - needed before pt can proceed w/ her CT scan sched for tomorrow 2/14.  Fine to proceed with IV contrast for CT.

## 2017-05-05 NOTE — Addendum Note (Signed)
Addended by: Sherren MochaSHAW, Zanayah Shadowens N on: 05/05/2017 06:02 AM   Modules accepted: Orders

## 2017-05-05 NOTE — Telephone Encounter (Signed)
Pt hasn't come in to have her BMET drawn yet. Her CT scan is scheduled for tomorrow and we need to get the labs back and faxed to Dr. Beverely PaceAlbertson at [phone] prior to the scan so I have cancelled Dr. Lonn GeorgiaSmiths order and ordered her bmp as STAT so that we can call (Phone [phone] - clinical line) /fax it in to Dr. Beverely PaceAlbertson first thing 2/14 a.m. And placed a note on the labs asking [facility] to also fax the stat results to above 3.  So sorry that the orders took so long to be placed - the phone call went to Dr. Katrinka BlazingSmith but then I just unconvered the faxed note and orders from Dr. Beverely PaceAlbertson in my paper box. My apologies on the confusion and delay!

## 2017-05-05 NOTE — Telephone Encounter (Signed)
Labs back and have requested they be faxed in new telephone message

## 2017-05-06 LAB — URINALYSIS, MICROSCOPIC ONLY: CASTS: NONE SEEN /LPF

## 2017-05-06 LAB — INTRINSIC FACTOR ANTIBODIES: Intrinsic Factor Abs, Serum: 1 AU/mL (ref 0.0–1.1)

## 2017-05-06 NOTE — Telephone Encounter (Signed)
Spoke with [facility] and they did receive stat labs for pt

## 2017-05-07 NOTE — Telephone Encounter (Signed)
Per chart, labs received by office of Dr Beverely PaceAlbertson

## 2017-05-08 LAB — URINE CULTURE

## 2017-05-10 ENCOUNTER — Other Ambulatory Visit: Payer: Self-pay | Admitting: Family Medicine

## 2017-05-10 MED ORDER — CIPROFLOXACIN HCL 250 MG PO TABS: 250.0000 mg | ORAL_TABLET | Freq: Two times a day (BID) | ORAL | 0 refills | Status: DC

## 2017-08-18 ENCOUNTER — Encounter: Payer: Self-pay | Admitting: Family Medicine

## 2017-10-01 ENCOUNTER — Ambulatory Visit (HOSPITAL_COMMUNITY)
Admission: EM | Admit: 2017-10-01 | Discharge: 2017-10-01 | Disposition: A | Discharge status: Home / Self Care | Payer: Medicare Other | Attending: Internal Medicine | Admitting: Internal Medicine

## 2017-10-01 ENCOUNTER — Encounter (HOSPITAL_COMMUNITY): Payer: Self-pay | Admitting: Emergency Medicine

## 2017-10-01 DIAGNOSIS — G43809 Other migraine, not intractable, without status migrainosus: Secondary | ICD-10-CM

## 2017-10-01 DIAGNOSIS — L03031 Cellulitis of right toe: Secondary | ICD-10-CM

## 2017-10-01 MED ORDER — SULFAMETHOXAZOLE-TRIMETHOPRIM 800-160 MG PO TABS: 1.0000 | ORAL_TABLET | Freq: Two times a day (BID) | ORAL | 0 refills | Status: DC

## 2017-10-01 NOTE — ED Triage Notes (Signed)
Pt c/o x2 weeks of R big toenail pain, states the nail is about to fall off. Denies injury.

## 2017-10-01 NOTE — Discharge Instructions (Addendum)
We are treating you for infection of the toe Please begin bactrim twice daily for 1 week, continue keflex Warm soaks twice daily, dry well afterward Follow up here with PCP or podiatry in 3-4 days  Return sooner if pain worsening, spreading, redness continuing to spread

## 2017-10-01 NOTE — ED Provider Notes (Signed)
[facility]    CSN: [id number] Arrival date & time: 10/01/17  1144     History   Chief Complaint Chief Complaint  Patient presents with  . Toe Pain    HPI Susan Boone is a 83 y.o. female history of arthritis, peripheral vascular disease, kidney stones presenting today for evaluation of right toe pain.  She states that over the past 2 weeks she has had worsening pain in her right great toe.  She has noticed the toenail changing and breaking as well as increased redness around her toe.  She states that she normally has purple discoloration throughout all of her toes and is actually better today than other days.  She has seen by vascular within the past year who have noted her veins to be patent and without abnormality.  She denies history of hypertension or smoking.  Denies history of diabetes.  She does occasionally have a numbness sensation in her feet.  HPI  Past Medical History:  Diagnosis Date  . Allergy    environmental  . Anemia   . Arthritis   . Asthma   . Chronic diarrhea 1986   since cholecystecomy  . Esophageal reflux   . H/O orthostatic hypotension   . 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 Migraine   . Urge incontinence of urine    followed by Urology - Dr. Wilburn MylarElizabeth Albertson    Patient Active Problem List   Diagnosis Date Noted  . Vitamin D deficiency 05/05/2017  . Vitamin B12 deficiency 05/05/2017  . Recurrent UTI 07/07/2013  . UMBILICAL HERNIA 10/20/2007  . IRRITABLE BOWEL SYNDROME 10/20/2007  . RENAL CYST, LEFT 10/20/2007  . ARTHRITIS 10/20/2007  . DYSPHAGIA UNSPECIFIED 10/20/2007  . DYSPHAGIA  10/20/2007  . DIARRHEA 10/20/2007  . ANEMIA, HX OF 10/20/2007  . NEPHROLITHIASIS, HX OF 10/20/2007    Past Surgical History:  Procedure Laterality Date  . ABLATION  2005   endovenous  . APPENDECTOMY    . AUGMENTATION MAMMAPLASTY Bilateral 1971   [facility] Dr. Tyrell AntonioMyer  . CHOLECYSTECTOMY  1986   chronic diarrhea since  . COSMETIC SURGERY    . LITHOTRIPSY Bilateral 2006  . PERCUTANEOUS NEPHROSTOLITHOTOMY Left 06/30/2011   w/ cystoscopy [facility]Gutierrez-[facility]ceves [facility] (and poss ureteral stent placement?)  . PERCUTANEOUS NEPHROSTOLITHOTOMY Left 07/07/2011   w/ ureteroscopy and stone manipulation Dr. [facility]rez[facility]ceves [facility]. Surg >4 hrs with gen anesthesia and pt noticed new permanent cognitive deficits after  . TUBAL LIGATION Bilateral 1968  . URETEROLITHOTOMY Right 12/17/2011   [facility]  . URETEROSCOPY WITH HOLMIUM LASER LITHOTRIPSY Left 07/07/2011   by percutaneous nephrostolithotomy with basket extraction, ureteral stent removal (placed 06/30/11?), and placement of Lt percutaneous nephrostomy tube by [facility]. [facility]utierrez-[facility]ceves [facility] for numerous >2cm staghorn calcium oxalate stones in left renal pelvis retained after initial perc nephrostolithotomy wiht stent placement 1 wk prior     OB History   None      Home Medications    Prior to Admission medications   Medication Sig Start Date End Date Taking? Authorizing Provider  cephALEXin (KEFLEX) 500 MG capsule Take 1 capsule (500 mg total) by mouth  at bedtime. 03/11/17   Sherren Mocha, MD  nadolol (CORGARD) 80 MG tablet Take 1 tablet (80 mg total) by mouth daily. Patient taking differently: Take 40 mg by mouth daily.  03/11/17   Sherren Mocha, MD  sulfamethoxazole-trimethoprim (BACTRIM DS,SEPTRA DS) 800-160 MG tablet Take 1 tablet by mouth 2 (two) times daily for 7 days. 10/01/17 10/08/17  Wieters, Hallie C, PA-C  Vitamin D, Ergocalciferol, (DRISDOL) 50000 units CAPS capsule Take 1 capsule (50,000 Units total) by mouth every 7  (seven) days. 05/05/17   Sherren Mocha, MD    Family History Family History  Problem Relation Age of Onset  . Stroke Father   . Hyperlipidemia Father   . Lung cancer Mother   . Cancer Maternal Grandmother     Social History Social History   Tobacco Use  . Smoking status: Never Smoker  . Smokeless tobacco: Never Used  Substance Use Topics  . Alcohol use: No    Alcohol/week: 0.0 oz  . Drug use: No     Allergies   Latex; Codeine; Doxycycline; and Tape   Review of Systems Review of Systems  Constitutional: Negative for fatigue and fever.  HENT: Negative for mouth sores.   Eyes: Negative for visual disturbance.  Respiratory: Negative for shortness of breath.   Cardiovascular: Positive for leg swelling. Negative for chest pain.  Gastrointestinal: Negative for abdominal pain, nausea and vomiting.  Genitourinary: Negative for genital sores.  Musculoskeletal: Negative for arthralgias and joint swelling.  Skin: Positive for color change and wound. Negative for rash.  Neurological: Negative for dizziness, weakness, light-headedness and headaches.     Physical Exam Triage Vital Signs ED Triage Vitals  Enc Vitals Group     BP 10/01/17 1202 115/62     Pulse Rate 10/01/17 1158 60     Resp 10/01/17 1158 18     Temp 10/01/17 1158 97.9 F (36.6 C)     Temp src --      SpO2 10/01/17 1158 99 %     Weight --      Height --      Head Circumference --      Peak Flow --      Pain Score --      Pain Loc --      Pain Edu? --      Excl. in GC? --    No data found.  Updated Vital Signs BP 115/62   Pulse 60   Temp 97.9 F (36.6 C)   Resp 18   SpO2 99%   Visual Acuity Right Eye Distance:   Left Eye Distance:   Bilateral Distance:    Right Eye Near:   Left Eye Near:    Bilateral Near:     Physical Exam  Constitutional: She is oriented to person, place, and time. She appears well-developed and well-nourished. No distress.  HENT:  Head: Normocephalic and atraumatic.    Eyes: Conjunctivae are normal.  Neck: Neck supple.  Cardiovascular: Normal rate and regular rhythm.  No murmur heard. Pulmonary/Chest: Effort normal and breath sounds normal. No respiratory distress.  Abdominal: She exhibits no distension.  Musculoskeletal: She exhibits edema and tenderness.  2+ pitting throughout lower extremities extending up to knee bilaterally Pulses difficult to assess given swelling  Neurological: She is alert and oriented to person, place, and time.  Skin: Skin is warm and dry.  Significant erythema and moderate swelling around right great toe, concentrated around nail bed, nail with cracking and yellow  discoloration, no sign of paronychia or underlying pustular abscess around the nail bed, nail with yellow discoloration. Respiratory is have purple discoloration  Psychiatric: She has a normal mood and affect.  Nursing note and vitals reviewed.      UC Treatments / Results  Labs (all labs ordered are listed, but only abnormal results are displayed) Labs Reviewed - No data to display  EKG None  Radiology No results found.  Procedures Procedures (including critical care time)  Medications Ordered in UC Medications - No data to display  Initial Impression / Assessment and Plan / UC Course  I have reviewed the triage vital signs and the nursing notes.  Pertinent labs & imaging results that were available during my care of the patient were reviewed by me and considered in my medical decision making (see chart for details).     No concerning for cellulitis versus early paronychia.  Does not appear to be septic joint, patient able to move toe.  Possible onychomycosis as well.  Will treat with Bactrim given patient is Artie on daily Keflex to prevent recurrent UTIs.  Will have patient follow-up closely, can return in 2 to 3 days if not improving, follow-up with [facility] as she also appears to have chronic foot issues.Discussed strict return  precautions. Patient verbalized understanding and is agreeable with plan.  Final Clinical Impressions(s) / UC Diagnoses   Final diagnoses:  Cellulitis of toe of right foot     Discharge Instructions     We are treating you for infection of the toe Please begin bactrim twice daily for 1 week, continue keflex Warm soaks twice daily, dry well afterward Follow up here with PCP or podiatry in 3-4 days  Return sooner if pain worsening, spreading, redness continuing to spread   ED Prescriptions    Medication Sig Dispense Auth. Provider   sulfamethoxazole-trimethoprim (BACTRIM DS,SEPTRA DS) 800-160 MG tablet Take 1 tablet by mouth 2 (two) times daily for 7 days. 14 tablet Wieters, Coleman C, PA-C     Controlled Substance Prescriptions [place] Controlled Substance Registry consulted? Not Applicable   Lew Dawes, [place] 10/01/17 1242

## 2017-10-07 ENCOUNTER — Ambulatory Visit (INDEPENDENT_AMBULATORY_CARE_PROVIDER_SITE_OTHER): Payer: Medicare Other | Admitting: Urgent Care

## 2017-10-07 ENCOUNTER — Encounter: Payer: Self-pay | Admitting: Urgent Care

## 2017-10-07 VITALS — BP 109/57 | HR 66 | Temp 98.2°F | Resp 16 | Ht 62.0 in | Wt 207.2 lb

## 2017-10-07 DIAGNOSIS — M7989 Other specified soft tissue disorders: Secondary | ICD-10-CM | POA: Diagnosis not present

## 2017-10-07 DIAGNOSIS — M79674 Pain in right toe(s): Secondary | ICD-10-CM

## 2017-10-07 DIAGNOSIS — L03031 Cellulitis of right toe: Secondary | ICD-10-CM

## 2017-10-07 LAB — POCT CBC
Granulocyte percent: 64.2 %G (ref 37–80)
HEMATOCRIT: 43.2 % (ref 37.7–47.9)
HEMOGLOBIN: 13.9 g/dL (ref 12.2–16.2)
LYMPH, POC: 2.2 (ref 0.6–3.4)
MCH, POC: 27.3 pg (ref 27–31.2)
MCHC: 32.1 g/dL (ref 31.8–35.4)
MCV: 84.8 fL (ref 80–97)
MID (cbc): 0.4 (ref 0–0.9)
MPV: 7.4 fL (ref 0–99.8)
PLATELET COUNT, POC: 189 10*3/uL (ref 142–424)
POC GRANULOCYTE: 4.7 (ref 2–6.9)
POC LYMPH PERCENT: 30.7 %L (ref 10–50)
POC MID %: 5.1 %M (ref 0–12)
RBC: 5.09 M/uL (ref 4.04–5.48)
RDW, POC: 15.4 %
WBC: 7.3 10*3/uL (ref 4.6–10.2)

## 2017-10-07 MED ORDER — SULFAMETHOXAZOLE-TRIMETHOPRIM 800-160 MG PO TABS: 1.0000 | ORAL_TABLET | Freq: Two times a day (BID) | ORAL | 0 refills | Status: DC

## 2017-10-07 NOTE — Progress Notes (Signed)
MRN: [id number] DOB: 12/13/1934  Subjective:   Susan Boone is a 83 y.o. female presenting for follow up on cellulitis of her right great toe.  Patient was initially seen at the [facility] urgent care on 10/01/2017, started on Bactrim for cellulitis of her right great toe.  Patient reports that she has been compliant with this and has also use warm soaks for her feet.  She does report that 2 days ago she had significant drainage from her right great toe and has improved dramatically since then.  Her pain, swelling, redness has decreased.  She denies any rashes, fever, nausea, vomiting.  She did get set up with a podiatrist for a consult next week on 10/14/2017.  Susan Boone has a current medication list which includes the following prescription(s): cephalexin, nadolol, sulfamethoxazole-trimethoprim, and vitamin d (ergocalciferol). Also is allergic to latex; codeine; doxycycline; and tape.  Susan Boone  has a past medical history of Allergy, Anemia, Arthritis, Asthma, Chronic diarrhea (1986), Esophageal reflux, H/O orthostatic hypotension, Kidney stones (2000), Migraine, and Urge incontinence of urine. Also  has a past surgical history that includes Appendectomy; Lithotripsy (Bilateral, 2006); Cosmetic surgery; Augmentation mammaplasty (Bilateral, 1971); Cholecystectomy (1986); Tubal ligation (Bilateral, 1968); Ureterolithotomy (Right, 12/17/2011); Percutaneous nephrostolithotomy (Left, 06/30/2011); Percutaneous nephrostolithotomy (Left, 07/07/2011); Ureteroscopy with holmium laser lithotripsy (Left, 07/07/2011); and Ablation (2005).  Objective:   Vitals: BP (!) 109/57   Pulse 66   Temp 98.2 F (36.8 C) (Oral)   Resp 16   Ht 5\' 2"  (1.575 m)   Wt 207 lb 3.2 oz (94 kg)   SpO2 96%   BMI 37.90 kg/m   Physical Exam  Constitutional: She is oriented to person, place, and time. She appears well-developed and well-nourished.  Cardiovascular: Normal rate.  Pulmonary/Chest: Effort normal.  Neurological:  She is alert and oriented to person, place, and time.  Skin:      From 10/07/2017   From 10/07/2017   From 10/01/2017     Results for orders placed or performed in visit on 10/07/17 (from the past 24 hour(s))  POCT CBC     Status: None   Collection Time: 10/07/17  4:19 PM  Result Value Ref Range   WBC 7.3 4.6 - 10.2 K/uL   Lymph, poc 2.2 0.6 - 3.4   POC LYMPH PERCENT 30.7 10 - 50 %L   MID (cbc) 0.4 0 - 0.9   POC MID % 5.1 0 - 12 %M   POC Granulocyte 4.7 2 - 6.9   Granulocyte percent 64.2 37 - 80 %G   RBC 5.09 4.04 - 5.48 M/uL   Hemoglobin 13.9 12.2 - 16.2 g/dL   HCT, POC 62.1 30.8 - 47.9 %   MCV 84.8 80 - 97 fL   MCH, POC 27.3 27 - 31.2 pg   MCHC 32.1 31.8 - 35.4 g/dL   RDW, POC 65.7 %   Platelet Count, POC 189 142 - 424 K/uL   MPV 7.4 0 - 99.8 fL    Assessment and Plan :   Cellulitis of toe of right foot - Plan: POCT CBC  Pain and swelling of toe of right foot  Patient has clinical improvement.  We will extend her antibiotic course and additional 3 days for her to complete 10-day course.  I counseled that I want her to continue to do warm soaks and keep her appointment with podiatry.  Patient is in agreement with treatment plan.  Return to clinic precautions reviewed.  Wallis Bamberg, PA-C [facility] [phone] 10/07/2017 3:55 PM

## 2017-10-07 NOTE — Patient Instructions (Addendum)
You may take 500mg  Tylenol every 6 hours for pain and inflammation.    Cellulitis, Adult Cellulitis is a skin infection. The infected area is usually red and sore. This condition occurs most often in the arms and lower legs. It is very important to get treated for this condition. Follow these instructions at home:  Take over-the-counter and prescription medicines only as told by your doctor.  If you were prescribed an antibiotic medicine, take it as told by your doctor. Do not stop taking the antibiotic even if you start to feel better.  Drink enough fluid to keep your pee (urine) clear or pale yellow.  Do not touch or rub the infected area.  Raise (elevate) the infected area above the level of your heart while you are sitting or lying down.  Place warm or cold wet cloths (warm or cold compresses) on the infected area. Do this as told by your doctor.  Keep all follow-up visits as told by your doctor. This is important. These visits let your doctor make sure your infection is not getting worse. Contact a doctor if:  You have a fever.  Your symptoms do not get better after 1-2 days of treatment.  Your bone or joint under the infected area starts to hurt after the skin has healed.  Your infection comes back. This can happen in the same area or another area.  You have a swollen bump in the infected area.  You have new symptoms.  You feel ill and also have muscle aches and pains. Get help right away if:  Your symptoms get worse.  You feel very sleepy.  You throw up (vomit) or have watery poop (diarrhea) for a long time.  There are red streaks coming from the infected area.  Your red area gets larger.  Your red area turns darker. This information is not intended to replace advice given to you by your health care provider. Make sure you discuss any questions you have with your health care provider. Document Released: 08/26/2007 Document Revised: 08/15/2015 Document  Reviewed: 01/16/2015 Elsevier Interactive Patient Education  2018 [employer]     IF you received an x-ray today, you will receive an invoice from [facility]. Please contact [facility] at [phone] with questions or concerns regarding your invoice.   IF you received labwork today, you will receive an invoice from [facility]. Please contact [facility] at [phone] with questions or concerns regarding your invoice.   Our billing staff will not be able to assist you with questions regarding bills from these companies.  You will be contacted with the lab results as soon as they are available. The fastest way to get your results is to activate your My Chart account. Instructions are located on the last page of this paperwork. If you have not heard from us regarding the results in 2 weeks, please contact this office.

## 2017-10-08 ENCOUNTER — Encounter: Payer: Self-pay | Admitting: Urgent Care

## 2017-10-14 ENCOUNTER — Ambulatory Visit: Payer: Medicare Other

## 2017-10-14 ENCOUNTER — Encounter: Payer: Self-pay | Admitting: Podiatry

## 2017-10-14 ENCOUNTER — Ambulatory Visit (INDEPENDENT_AMBULATORY_CARE_PROVIDER_SITE_OTHER): Payer: Medicare Other | Admitting: Podiatry

## 2017-10-14 VITALS — BP 129/63 | HR 59 | Resp 16 | Ht 62.5 in | Wt 207.0 lb

## 2017-10-14 DIAGNOSIS — M79675 Pain in left toe(s): Secondary | ICD-10-CM | POA: Diagnosis not present

## 2017-10-14 DIAGNOSIS — B351 Tinea unguium: Secondary | ICD-10-CM

## 2017-10-14 DIAGNOSIS — M79674 Pain in right toe(s): Secondary | ICD-10-CM

## 2017-10-14 NOTE — Progress Notes (Signed)
   Subjective:    Patient ID: Susan Boone, female    DOB: [birth year], 83 y.o.   MRN: [id number]  HPI    Review of Systems  All other systems reviewed and are negative.      Objective:   Physical Exam        Assessment & Plan:

## 2017-10-14 NOTE — Progress Notes (Signed)
Subjective:   Patient ID: Susan Boone, female   DOB: 83 y.o.   MRN: [id number]   HPI Patient presents stating she has had a crack and some damage in her right hallux nail and all of her nails are somewhat incurvated and sore and hard for her to cut and she does have a history of circulatory disease.  Patient does not smoke likes to be active   Review of Systems  All other systems reviewed and are negative.       Objective:  Physical Exam  Constitutional: She appears well-developed and well-nourished.  Cardiovascular: Intact distal pulses.  Pulmonary/Chest: Effort normal.  Musculoskeletal: Normal range of motion.  Neurological: She is alert.  Skin: Skin is warm.  Nursing note and vitals reviewed.   Neurovascular status found to be intact muscle strength was adequate range of motion was within normal limits with patient found to have redness of her feet and nail disease 1-5 both feet to get incurvated in the corners and can be tender and thick.  Patient states that it is very hard for her to cut she cannot reach them and she does have good digital perfusion     Assessment:  Ingrown nail deformities 1-5 both feet with thickness and mild brittle disease with damage to the right hallux nail which I believe is local it does not appear to be a problem     Plan:  H&P condition reviewed and debrided nailbeds 1-5 both feet with no iatrogenic bleeding and explained that I am okay with her getting pedicures that should not be a problem

## 2017-10-14 NOTE — Patient Instructions (Signed)

## 2017-10-18 ENCOUNTER — Other Ambulatory Visit: Payer: Self-pay | Admitting: Plastic Surgery

## 2017-10-19 ENCOUNTER — Other Ambulatory Visit: Payer: Self-pay | Admitting: Family Medicine

## 2017-10-19 DIAGNOSIS — Z1231 Encounter for screening mammogram for malignant neoplasm of breast: Secondary | ICD-10-CM

## 2017-11-17 ENCOUNTER — Other Ambulatory Visit: Payer: Self-pay | Admitting: Family Medicine

## 2017-11-23 ENCOUNTER — Other Ambulatory Visit: Payer: Self-pay | Admitting: Family Medicine

## 2017-11-23 DIAGNOSIS — G43809 Other migraine, not intractable, without status migrainosus: Secondary | ICD-10-CM

## 2017-11-26 ENCOUNTER — Ambulatory Visit: Payer: Medicare Other

## 2017-12-10 ENCOUNTER — Ambulatory Visit
Admission: RE | Admit: 2017-12-10 | Discharge: 2017-12-10 | Disposition: A | Discharge status: Home / Self Care | Payer: Medicare Other | Source: Ambulatory Visit | Attending: Family Medicine | Admitting: Family Medicine

## 2017-12-10 DIAGNOSIS — Z1231 Encounter for screening mammogram for malignant neoplasm of breast: Secondary | ICD-10-CM

## 2018-03-24 ENCOUNTER — Other Ambulatory Visit: Payer: Self-pay | Admitting: Family Medicine

## 2018-03-24 DIAGNOSIS — R300 Dysuria: Secondary | ICD-10-CM

## 2018-03-30 ENCOUNTER — Other Ambulatory Visit: Payer: Self-pay | Admitting: Family Medicine

## 2018-03-30 DIAGNOSIS — G43809 Other migraine, not intractable, without status migrainosus: Secondary | ICD-10-CM

## 2018-03-31 NOTE — Telephone Encounter (Signed)
Requested Prescriptions  Pending Prescriptions Disp Refills  . nadolol (CORGARD) 80 MG tablet [Pharmacy Med Name: NADOLOL 80MG  TABLETS] 90 tablet 1    Sig: TAKE 1 TABLET(80 MG) BY MOUTH DAILY     Cardiovascular:  Beta Blockers Passed - 03/30/2018  8:23 PM      Passed - Last BP in normal range    BP Readings from Last 1 Encounters:  10/14/17 129/63         Passed - Last Heart Rate in normal range    Pulse Readings from Last 1 Encounters:  10/14/17 (!) 59         Passed - Valid encounter within last 6 months    Recent Outpatient Visits          5 months ago Cellulitis of toe of right foot   Primary Care at [facility], [place], [place]   11 months ago Recurrent UTI   [facility] at [facility], [place] July, [place]   1 year ago Recurrent UTI   [facility] Care at [facility], [place] July, [place]   1 year ago Other fatigue   Primary Care at [facility], Myrle Sheng, [place]   2 years ago Vaginal irritation   Primary Care at [place], [place], [place]      Future Appointments            In 2 weeks Clelia Croft [place], MD Primary Care at [place]

## 2018-04-14 ENCOUNTER — Telehealth: Payer: Self-pay | Admitting: Family Medicine

## 2018-04-14 NOTE — Telephone Encounter (Signed)
Called and spoke with pt regarding their appt scheduled with Dr. Clelia Croft on 04/19/18. I was able to get her rescheduled for 04/19/18 at 2:20 PM with Dr. Leretha Pol. I advised of time, building number and late policy. Pt acknowledged.

## 2018-04-19 ENCOUNTER — Ambulatory Visit: Payer: Medicare Other | Admitting: Family Medicine

## 2018-05-09 ENCOUNTER — Telehealth: Payer: Self-pay | Admitting: Family Medicine

## 2018-05-09 NOTE — Telephone Encounter (Signed)
Called and spoke with pt regarding their appt scheduled for 06/09/18 with Dr. Clelia Croft. Due to Dr. Clelia Croft being out of the office, pt needed to be rescheduled. I was able to reschedule for 06/02/18 with Dr. Clelia Croft. I advised of time and late policy. Pt acknowledged.

## 2018-05-23 ENCOUNTER — Telehealth: Payer: Self-pay | Admitting: Family Medicine

## 2018-05-23 NOTE — Telephone Encounter (Signed)
Called pt left message that we had to cancel appt for 06/02/2018 gave Dr.Shaw new office number and offered for them to call and reschedule appt for with [place] FR

## 2018-06-02 ENCOUNTER — Ambulatory Visit: Payer: Medicare Other | Admitting: Family Medicine

## 2018-06-09 ENCOUNTER — Ambulatory Visit: Payer: Medicare Other | Admitting: Family Medicine

## 2019-05-22 ENCOUNTER — Other Ambulatory Visit: Payer: Self-pay

## 2019-05-22 ENCOUNTER — Ambulatory Visit (HOSPITAL_COMMUNITY)
Admission: EM | Admit: 2019-05-22 | Discharge: 2019-05-22 | Disposition: A | Discharge status: Home / Self Care | Payer: Medicare Other | Attending: Family Medicine | Admitting: Family Medicine

## 2019-05-22 ENCOUNTER — Encounter (HOSPITAL_COMMUNITY): Payer: Self-pay

## 2019-05-22 DIAGNOSIS — R519 Headache, unspecified: Secondary | ICD-10-CM | POA: Diagnosis not present

## 2019-05-22 DIAGNOSIS — S46811A Strain of other muscles, fascia and tendons at shoulder and upper arm level, right arm, initial encounter: Secondary | ICD-10-CM

## 2019-05-22 DIAGNOSIS — B0222 Postherpetic trigeminal neuralgia: Secondary | ICD-10-CM

## 2019-05-22 DIAGNOSIS — M542 Cervicalgia: Secondary | ICD-10-CM

## 2019-05-22 MED ORDER — TIZANIDINE HCL 4 MG PO TABS: 4.0000 mg | ORAL_TABLET | Freq: Four times a day (QID) | ORAL | 0 refills | Status: DC | PRN

## 2019-05-22 MED ORDER — IBUPROFEN 800 MG PO TABS: 800.0000 mg | ORAL_TABLET | Freq: Three times a day (TID) | ORAL | 0 refills | Status: DC | PRN

## 2019-05-22 MED ORDER — KETOROLAC TROMETHAMINE 30 MG/ML IJ SOLN
30.0000 mg | Freq: Once | INTRAMUSCULAR | Status: AC
  Administered 2019-05-22: 30 mg via INTRAMUSCULAR

## 2019-05-22 MED ORDER — VALACYCLOVIR HCL 1 G PO TABS: 1000.0000 mg | ORAL_TABLET | Freq: Three times a day (TID) | ORAL | 0 refills | Status: DC

## 2019-05-22 MED ORDER — KETOROLAC TROMETHAMINE 30 MG/ML IJ SOLN
INTRAMUSCULAR | Status: AC
  Filled 2019-05-22: qty 1

## 2019-05-22 NOTE — ED Triage Notes (Signed)
Pt c/o "muscular" pain approx 1 week and applied topical relief medications. C/o rach to neck, posterior head, right side of face. C/o pain and itching with rash, c/o pain "inside head".  States has had chicken pox and received the shingles vaccine. Diffuse raised rash with some vesicular areas noted to scalp, neck, and face. Denies SOB, facial swelling, etc.

## 2019-05-22 NOTE — Discharge Instructions (Addendum)
You have shingles. You are contagious as long as rash and blisters are open. I have sent in Valtrex 3 times per day for a week.  You also have a muscle strain in your neck. I have sent in ibuprofen and tizanidine for this. You may also use heat and topical rubs as needed to the area.   Follow up with primary care as needed. Go to the ER for shortness of breath, high fever, or other concerning symptoms.

## 2019-05-23 NOTE — ED Provider Notes (Signed)
St[facility]    CSN: [id number] Arrival date & time: 05/22/19  1104      History   Chief Complaint Chief Complaint  Patient presents with  . Rash  . Neck Pain  . Headache    HPI Susan Boone is a 85 y.o. female.   Reports rash to R side of scalp, R neck, R side of face, R shoulder x 3 days. Describes rash as painful and itchy. Reports R neck soreness and HA only on R side, with tenderness when she touches it. Reports that she has had chickenpox, and one shingles vaccine (cannot recall which one). Denies fever, ST, SOB, n/v/d, chills, other symptoms.  ROS per HPI  The history is provided by the patient.  Rash Associated symptoms: headaches   Neck Pain Associated symptoms: headaches   Headache Associated symptoms: neck pain     Past Medical History:  Diagnosis Date  . Allergy    environmental  . Anemia   . Arthritis   . Asthma   . Chronic diarrhea 1986   since cholecystecomy  . Esophageal reflux   . H/O orthostatic hypotension   . 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 Migraine   . Urge incontinence of urine    followed by Urology - Dr. Waynette Buttery    Patient Active Problem List   Diagnosis Date Noted  . Vitamin D deficiency 05/05/2017  . Vitamin B12 deficiency 05/05/2017  . Recurrent UTI 07/07/2013  . Calculus of kidney 06/23/2011  . Environmental allergies 06/23/2011  . Lower leg edema 06/23/2011  . Migraine 06/23/2011  . UMBILICAL HERNIA 97/98/9211  . IRRITABLE BOWEL SYNDROME 10/20/2007  . RENAL CYST, LEFT 10/20/2007  . ARTHRITIS 10/20/2007  . DYSPHAGIA UNSPECIFIED 10/20/2007  . DYSPHAGIA  10/20/2007  . DIARRHEA 10/20/2007  . ANEMIA, HX OF 10/20/2007  . NEPHROLITHIASIS, HX OF 10/20/2007    Past Surgical History:  Procedure Laterality Date  . ABLATION  2005   endovenous  . APPENDECTOMY    . AUGMENTATION MAMMAPLASTY Bilateral 1971   [facility] Dr. Tressa Busman  . CHOLECYSTECTOMY  1986   chronic diarrhea since  . COSMETIC SURGERY    . LITHOTRIPSY Bilateral 2006  . PERCUTANEOUS NEPHROSTOLITHOTOMY Left 06/30/2011   w/ cystoscopy by Dr. Roby Lofts at [facility] (and poss ureteral stent placement?)  . PERCUTANEOUS NEPHROSTOLITHOTOMY Left 07/07/2011   w/ ureteroscopy and stone manipulation Dr. Roby Lofts at [facility]. Surg >4 hrs with gen anesthesia and pt noticed new permanent cognitive deficits after  . TUBAL LIGATION Bilateral 1968  . URETEROLITHOTOMY Right 12/17/2011   Dr. Roby Lofts at [facility]  . URETEROSCOPY WITH HOLMIUM LASER LITHOTRIPSY Left 07/07/2011   by percutaneous nephrostolithotomy with basket extraction, ureteral stent removal (placed 06/30/11?), and placement of Lt percutaneous nephrostomy tube by Dr. Roby Lofts at [facility] for numerous >2cm staghorn calcium oxalate stones in left renal pelvis retained after initial perc nephrostolithotomy wiht stent placement 1 wk prior     OB History   No obstetric history on file.      Home Medications    Prior to Admission medications   Medication Sig Start Date End Date Taking? Authorizing Provider  cephALEXin (KEFLEX) 500 MG  capsule Take 1 capsule (500 mg total) by mouth at bedtime. 03/11/17  Yes Sherren Mocha, MD  Homeopathic Products (ARNICARE ARNICA) OINT Apply topically.   Yes [provider]  [facility] ARTHRITIS PAIN RELIEF EX) Apply topically.   Yes [provider]  nadolol (CORGARD) 40 MG tablet Take 40 mg by mouth daily. 04/09/19  Yes [provider]  cyanocobalamin (,VITAMIN B-12,) 1000 MCG/ML injection Inject 1,000 mcg into the muscle every 30 (thirty) days. 04/09/19    [provider]  ibuprofen (ADVIL) 800 MG tablet Take 1 tablet (800 mg total) by mouth every 8 (eight) hours as needed for moderate pain. 05/22/19   Moshe Cipro, NP  nadolol (CORGARD) 80 MG tablet TAKE 1 TABLET(80 MG) BY MOUTH DAILY 03/31/18   Sherren Mocha, MD  sulfamethoxazole-trimethoprim (BACTRIM DS,SEPTRA DS) 800-160 MG tablet Take 1 tablet by mouth 2 (two) times daily. 10/07/17   Wallis Bamberg, PA-C  tiZANidine (ZANAFLEX) 4 MG tablet Take 1 tablet (4 mg total) by mouth every 6 (six) hours as needed for muscle spasms. 05/22/19   Moshe Cipro, NP  valACYclovir (VALTREX) 1000 MG tablet Take 1 tablet (1,000 mg total) by mouth 3 (three) times daily. 05/22/19   Moshe Cipro, NP  Vitamin D, Ergocalciferol, (DRISDOL) 50000 units CAPS capsule TAKE ONE CAPSULE BY MOUTH EVERY 7 DAYS 11/17/17   Sherren Mocha, MD    Family History Family History  Problem Relation Age of Onset  . Stroke Father   . Hyperlipidemia Father   . Lung cancer Mother   . Cancer Maternal Grandmother     Social History Social History   Tobacco Use  . Smoking status: Never Smoker  . Smokeless tobacco: Never Used  Substance Use Topics  . Alcohol use: No    Alcohol/week: 0.0 standard drinks  . Drug use: No     Allergies   Azithromycin, Latex, Other, Codeine, Doxycycline, Gluten meal, and Tape   Review of Systems Review of Systems  Musculoskeletal: Positive for neck pain.  Skin: Positive for rash.  Neurological: Positive for headaches.     Physical Exam Triage Vital Signs ED Triage Vitals  Enc Vitals Group     BP 05/22/19 1147 (!) 171/81     Pulse Rate 05/22/19 1147 70     Resp 05/22/19 1147 18     Temp 05/22/19 1147 98.2 F (36.8 C)     Temp Source 05/22/19 1147 Oral     SpO2 05/22/19 1147 98 %     Weight --      Height --      Head Circumference --      Peak Flow --      Pain Score 05/22/19 1141 8     Pain Loc --      Pain Edu? --      Excl. in GC? --    No data  found.  Updated Vital Signs BP (!) 171/81 (BP Location: Left Arm)   Pulse 70   Temp 98.2 F (36.8 C) (Oral)   Resp 18   SpO2 98%       Physical Exam Vitals and nursing note reviewed.  Constitutional:      General: She is not in acute distress.    Appearance: She is well-developed.  HENT:     Head: Normocephalic and atraumatic.      Comments: Areas of vesicular cluster of erythematous rashes.    Mouth/Throat:     Mouth: Mucous membranes are moist.  Pharynx: Oropharynx is clear.  Eyes:     Extraocular Movements: Extraocular movements intact.     Conjunctiva/sclera: Conjunctivae normal.  Cardiovascular:     Rate and Rhythm: Normal rate and regular rhythm.     Heart sounds: No murmur.  Pulmonary:     Effort: Pulmonary effort is normal. No respiratory distress.     Breath sounds: Normal breath sounds.  Abdominal:     Palpations: Abdomen is soft.     Tenderness: There is no abdominal tenderness.  Musculoskeletal:        General: Tenderness present.     Cervical back: Neck supple.     Comments: R trapezius   Skin:    General: Skin is warm and dry.     Capillary Refill: Capillary refill takes less than 2 seconds.  Neurological:     Mental Status: She is alert and oriented to person, place, and time.  Psychiatric:        Mood and Affect: Mood normal.        Behavior: Behavior normal.      UC Treatments / Results  Labs (all labs ordered are listed, but only abnormal results are displayed) Labs Reviewed - No data to display  EKG   Radiology No results found.  Procedures Procedures (including critical care time)  Medications Ordered in UC Medications  ketorolac (TORADOL) 30 MG/ML injection 30 mg (30 mg Intramuscular Given 05/22/19 1228)    Initial Impression / Assessment and Plan / UC Course  I have reviewed the triage vital signs and the nursing notes.  Pertinent labs & imaging results that were available during my care of the patient were reviewed by  me and considered in my medical decision making (see chart for details).     Presents with varicella zoster with trigeminal herpes zoster. Instructed that she is still contagious as long as vesicles are open and draining. Encouraged not to share pillows, hair hygiene items with husband. Also has R trapezius sprain. Given Toradol 30mg  for neck pain and headache. Prescribed Valtrex 1000mg  TID x 7 days. Instructed to follow up with primary care as needed. Instructed to go to the ER if vesicles spread to eyelids, eyeballs, SOB, confusion, high fever, or other concerning symptoms.  Final Clinical Impressions(s) / UC Diagnoses   Final diagnoses:  Trigeminal herpes zoster  Nonintractable headache, unspecified chronicity pattern, unspecified headache type  Trapezius strain, right, initial encounter  Neck pain     Discharge Instructions     You have shingles. You are contagious as long as rash and blisters are open. I have sent in Valtrex 3 times per day for a week.  You also have a muscle strain in your neck. I have sent in ibuprofen and tizanidine for this. You may also use heat and topical rubs as needed to the area.   Follow up with primary care as needed. Go to the ER for shortness of breath, high fever, or other concerning symptoms.       ED Prescriptions    Medication Sig Dispense Auth. Provider   valACYclovir (VALTREX) 1000 MG tablet Take 1 tablet (1,000 mg total) by mouth 3 (three) times daily. 21 tablet , NP   ibuprofen (ADVIL) 800 MG tablet Take 1 tablet (800 mg total) by mouth every 8 (eight) hours as needed for moderate pain. 21 tablet , NP   tiZANidine (ZANAFLEX) 4 MG tablet Take 1 tablet (4 mg total) by mouth every 6 (six) hours as needed for  muscle spasms. 30 tablet Moshe Cipro, NP     I have reviewed the PDMP during this encounter.   Moshe Cipro, NP 05/23/19 1318

## 2019-07-13 ENCOUNTER — Other Ambulatory Visit: Payer: Self-pay

## 2019-07-13 ENCOUNTER — Encounter: Payer: Self-pay | Admitting: Internal Medicine

## 2019-07-13 ENCOUNTER — Encounter (INDEPENDENT_AMBULATORY_CARE_PROVIDER_SITE_OTHER): Payer: Self-pay

## 2019-07-13 ENCOUNTER — Ambulatory Visit (INDEPENDENT_AMBULATORY_CARE_PROVIDER_SITE_OTHER): Payer: Medicare Other | Admitting: Internal Medicine

## 2019-07-13 VITALS — BP 128/82 | HR 62 | Temp 97.8°F | Ht 62.0 in | Wt 196.0 lb

## 2019-07-13 DIAGNOSIS — N281 Cyst of kidney, acquired: Secondary | ICD-10-CM

## 2019-07-13 DIAGNOSIS — Z87442 Personal history of urinary calculi: Secondary | ICD-10-CM | POA: Diagnosis not present

## 2019-07-13 DIAGNOSIS — E559 Vitamin D deficiency, unspecified: Secondary | ICD-10-CM

## 2019-07-13 DIAGNOSIS — B0229 Other postherpetic nervous system involvement: Secondary | ICD-10-CM

## 2019-07-13 DIAGNOSIS — K580 Irritable bowel syndrome with diarrhea: Secondary | ICD-10-CM

## 2019-07-13 DIAGNOSIS — Z1231 Encounter for screening mammogram for malignant neoplasm of breast: Secondary | ICD-10-CM

## 2019-07-13 DIAGNOSIS — R600 Localized edema: Secondary | ICD-10-CM

## 2019-07-13 DIAGNOSIS — N2889 Other specified disorders of kidney and ureter: Secondary | ICD-10-CM

## 2019-07-13 DIAGNOSIS — N390 Urinary tract infection, site not specified: Secondary | ICD-10-CM

## 2019-07-13 DIAGNOSIS — T8543XS Leakage of breast prosthesis and implant, sequela: Secondary | ICD-10-CM

## 2019-07-13 MED ORDER — VITAMIN D-3 125 MCG (5000 UT) PO TABS: 1.0000 | ORAL_TABLET | Freq: Every day | ORAL | 5 refills | Status: DC

## 2019-07-13 NOTE — Progress Notes (Signed)
Provider:  Gwenith Spitz. Renato Gails, D.O., C.M.D. Location:   [facility]  Place of Service:   clinic  Previous PCP: Kermit Balo, DO Patient Care Team: Kermit Balo, DO as PCP - General (Geriatric Medicine) Campbell Stall, MD as Attending Physician (Dermatology) Glenna Fellows, MD as Consulting Physician (Plastic Surgery) Wilburn Mylar, MD as Referring Physician (Urology) Early, Kristen Loader, MD as Consulting Physician (Vascular Surgery) Ollen Gross, MD as Consulting Physician (Orthopedic Surgery)  Extended Emergency Contact Information Primary Emergency Contact: Gray,Ernest Address: [address]          [place], [place] [address] D[place] of [place] Home Phone: [phone] Work Phone: [phone] Relation: Significant other  Goals of Care: Advanced Directive information Advanced Directives 07/13/2019  Does Patient Have a Medical Advance Directive? No  Type of Advance Directive -  Copy of Healthcare Power of Attorney in Chart? -  Would patient like information on creating a medical advance directive? No - Patient declined;Yes (ED - Information included in AVS)      Chief Complaint  Patient presents with  . Establish Care    New Patient to establish care     HPI: Patient is a 85 y.o. female seen today to establish with [facility].  Records reportedly were requested from Dr. Norberto Sorenson, but pt signed wrong location on record request form so doubt this was successful.  Has a diarrhea problem that she needs to get under control--can have incontinence.  Does use depends for her urinary continence.  Stool is loose at times and she cannot control it.   IBS with diarrhea--per Dr. Dickie La.  She got tested years ago for c diff.  Not fun to know she cannot leave her own bathroom. Happens right much lately.  Keeps a record of it.  She will use pepto bismol, BRAT diet, and the grace of god might stop it after a day or two.   She also has hemorrhoids so they get very unhappy.  She  has done imodium.  She had a stomach ulcer in the past and the imodium seems to cause stomach pain.  Problem was less frequent in the past.  Has some abdominal cramping.  She has not noticed no clear food correlations over the years.  Oatmeal seems to be soothing.   Even through her knees and hip are bad and she can't work in her yard or dance all night, can't stand long enough to cook creatively, but says she is actually a really lucky person.  Is glad she is so cogntiively intact.    She's had a mass on her right kidney at [facility] which was stable as of 2/19 at [facility] 14x61mm right upper pole.  Has the discs as well for the two imaging studies.  No change from 2018.  She missed the last scan b/c her doctor moved away (Dr. Wilburn Mylar).    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   She lives with her long-term partner, [name].  They have never married and she says it's worked out better that way.     She is really good about hydrating due to preventing kidney stones.  Legs swell tremendously.  She did have varicose vein surgery in her left leg.  Now both are swelling.  Feet are purple-black.  She wonders what that means cardiovascularly.   They feel cold as do her hands.  They're semi-numb--feels like she's wearing heavy socks, but if she just slightly touches  something with them, it's very painful.    When she was going to have the staghorn stone removed (took 2 surgeries a week apart), she thinks it was the second time when she woke up sitting straight up on a gurney--eyes were blurry and she had people all around her.  She saw them all around and didn't know them and Dr. Sharen HonesGutierrez was there and she asked him how the surgery went.  She has not been anesthetized since (2013).    She has not had her hip or knees done due to episode above where she believes she had a reaction to anesthesia.  She has implant stuff leaking in her breasts.  She does need a mammogram.     She would love to get all of that taken care of, but she's concerned she may not come out of it.  Maybe under local, but that's it.  She did have a will drawn up before her 2013 surgery.  She's of the opinion that she does not want to smother to death.   She would not want to live and be a vegetable.  She wants to discuss more about CPR option in the future.    Shingles after effects.  [facility]1 [facility]--got 7 days of valtrex.  Then some pain med that was an nsaid (can't take)--took tylenol instead.  It was right neck, right side of face, right shoulder and right posterior scalp.  Can shampoo and brush hair, but still hurts some.  Even had a few in her mouth at the onset.  Felt like she had brainfog with it, too.    Had right sided kidney stones and had stent placed, but stent colic was a problem.  Stent eventually removed.  Felt so much better.  At that point, if she had to void, she needed to go--no waiting till after she did something.  Slight delays might do it.  Has had 4 kids and postmenopausal.  After the stent, she was sitting on the couch and by the time she took another step, her whole bladder emptied.  It kept happening and she got depends and needed them since.  Taking keflex as prophylaxis to prevent recurrent UTIs.    Takes nadolol 40mg  daily for bp.    Also taking vitamin D3 since last fall.    Past Medical History:  Diagnosis Date  . Allergy    environmental  . Anemia   . Arthritis   . Asthma   . Chronic diarrhea 1986   since cholecystecomy  . Esophageal reflux   . H/O orthostatic hypotension   . 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 Migraine   . Urge incontinence of urine    followed by Urology - Dr. Wilburn MylarElizabeth Albertson   Past Surgical History:  Procedure Laterality Date  . ABLATION  2005   endovenous  . APPENDECTOMY    . AUGMENTATION MAMMAPLASTY Bilateral 1971   [facility] Dr. Tyrell AntonioMyer  . CHOLECYSTECTOMY  1986   chronic diarrhea since  . COSMETIC SURGERY    . LITHOTRIPSY Bilateral 2006  . PERCUTANEOUS NEPHROSTOLITHOTOMY Left 06/30/2011   w/ cystoscopy by Dr.  Gutierrez-Aceves at [facility] (and poss ureteral stent placement?)  . PERCUTANEOUS NEPHROSTOLITHOTOMY Left 07/07/2011   w/ ureteroscopy and stone manipulation Dr. Karena Addison at [facility]. Surg >4 hrs with gen anesthesia and pt noticed new permanent cognitive deficits after  . TUBAL LIGATION Bilateral 1968  . URETEROLITHOTOMY Right 12/17/2011   Dr. Karena Addison at [facility]  . URETEROSCOPY WITH HOLMIUM LASER LITHOTRIPSY Left 07/07/2011   by percutaneous nephrostolithotomy with basket extraction, ureteral stent removal (placed 06/30/11?), and placement of Lt percutaneous nephrostomy tube by Dr. Karena Addison at [facility] for numerous >2cm staghorn calcium oxalate stones in left renal pelvis retained after initial perc nephrostolithotomy wiht stent placement 1 wk prior     Social History   Socioeconomic History  . Marital status: Divorced    Spouse name: Not on file  . Number of children: Not on file  . Years of education: Not on file  . Highest education level: Not on file  Occupational History  . Occupation: retired  Tobacco Use  . Smoking status: Never Smoker  . Smokeless tobacco: Never Used  Substance and Sexual Activity  . Alcohol use: No    Alcohol/week: 0.0 standard drinks  . Drug use: No  . Sexual activity: Not on file  Other Topics Concern  . Not on file  Social History Narrative  . Not on file   Social Determinants of Health   Financial Resource Strain:   . Difficulty of Paying Living Expenses:   Food Insecurity:   . Worried About  Programme researcher, broadcasting/film/video in the Last Year:   . Barista in the Last Year:   Transportation Needs:   . Freight forwarder (Medical):   Marland Kitchen Lack of Transportation (Non-Medical):   Physical Activity:   . Days of Exercise per Week:   . Minutes of Exercise per Session:   Stress:   . Feeling of Stress :   Social Connections:   . Frequency of Communication with Friends and Family:   . Frequency of Social Gatherings with Friends and Family:   . Attends Religious Services:   . Active Member of Clubs or Organizations:   . Attends Banker Meetings:   Marland Kitchen Marital Status:     reports that she has never smoked. She has never used smokeless tobacco. She reports that she does not drink alcohol or use drugs.  Functional Status Survey:    Family History  Problem Relation Age of Onset  . Stroke Father   . Hyperlipidemia Father   . Lung cancer Mother   . Cancer Maternal Grandmother     Health Maintenance  Topic Date Due  . INFLUENZA VACCINE  10/22/2019  . TETANUS/TDAP  02/24/2023  . DEXA SCAN  Completed  . COVID-19 Vaccine  Completed  . PNA vac Low Risk Adult  Completed    Allergies  Allergen Reactions  . Azithromycin Other (See Comments)    thrush  . Latex Hives and Other (See Comments)    Lip swelling and redness Lip swelling and redness  . Other Rash  . Codeine   . Doxycycline   . Gluten Meal Diarrhea  . Tape Rash    Outpatient Encounter Medications as of 07/13/2019  Medication Sig  . cephALEXin (KEFLEX) 500 MG capsule Take 1 capsule (500 mg total) by mouth at bedtime.  . Liniments (SALONPAS ARTHRITIS PAIN RELIEF EX) Apply topically.  . nadolol (CORGARD) 40 MG tablet Take 40 mg by mouth daily.  . Vitamin D, Ergocalciferol, (DRISDOL) 50000 units CAPS  capsule TAKE ONE CAPSULE BY MOUTH EVERY 7 DAYS  . [DISCONTINUED] Homeopathic Products (ARNICARE ARNICA) OINT Apply topically.  . [DISCONTINUED] cyanocobalamin (,VITAMIN B-12,) 1000 MCG/ML injection Inject 1,000  mcg into the muscle every 30 (thirty) days.  . [DISCONTINUED] ibuprofen (ADVIL) 800 MG tablet Take 1 tablet (800 mg total) by mouth every 8 (eight) hours as needed for moderate pain.  . [DISCONTINUED] nadolol (CORGARD) 80 MG tablet TAKE 1 TABLET(80 MG) BY MOUTH DAILY  . [DISCONTINUED] sulfamethoxazole-trimethoprim (BACTRIM DS,SEPTRA DS) 800-160 MG tablet Take 1 tablet by mouth 2 (two) times daily.  . [DISCONTINUED] tiZANidine (ZANAFLEX) 4 MG tablet Take 1 tablet (4 mg total) by mouth every 6 (six) hours as needed for muscle spasms.  . [DISCONTINUED] valACYclovir (VALTREX) 1000 MG tablet Take 1 tablet (1,000 mg total) by mouth 3 (three) times daily.   No facility-administered encounter medications on file as of 07/13/2019.    Review of Systems  Constitutional: Negative for chills, diaphoresis, fever and malaise/fatigue.       Loss of appetite, fever, chills, fatigue, sweats with shingles, but resolved  HENT: Positive for hearing loss.        Partial denture maxillary; dry mouth, loss of taste; postnasal drip  Eyes:       Glasses; cataracts--had left done and needs right done  Respiratory: Negative for cough, sputum production and shortness of breath.   Cardiovascular: Positive for leg swelling. Negative for chest pain, palpitations and PND.  Gastrointestinal: Positive for abdominal pain and diarrhea.       Ulcer years ago, hemorrhoids  Genitourinary: Positive for frequency and urgency.       Incontinence; staghorn calculi; mass on right kidney  Musculoskeletal: Positive for back pain and joint pain. Negative for falls.  Skin: Negative for rash.       Dry skin; still has painful areas on posterior scalp/neck region  Neurological: Positive for sensory change and headaches.       Loss of balance  Endo/Heme/Allergies: Bruises/bleeds easily.  Psychiatric/Behavioral: Positive for memory loss. Negative for depression. The patient is not nervous/anxious and does not have insomnia.        Stress     Vitals:   07/13/19 1347  Weight: 196 lb (88.9 kg)  Height: 5\' 2"  (1.575 m)   Body mass index is 35.85 kg/m. Physical Exam Vitals reviewed.  Constitutional:      General: She is not in acute distress.    Appearance: Normal appearance. She is obese. She is not toxic-appearing.  HENT:     Head: Normocephalic and atraumatic.  Cardiovascular:     Rate and Rhythm: Normal rate and regular rhythm.     Heart sounds: Normal heart sounds. No murmur.     Comments: Bilateral pitting edema 2+ of feet which are dark purple even blackish on right foot; right without palpable pulse, left DP pulse intact; both feet cool to touch Pulmonary:     Effort: Pulmonary effort is normal.     Breath sounds: Normal breath sounds. No wheezing, rhonchi or rales.  Abdominal:     General: Bowel sounds are normal.  Musculoskeletal:        General: Normal range of motion.     Right lower leg: Edema present.     Left lower leg: Edema present.  Skin:    General: Skin is warm and dry.     Comments: Except feet (described above)  Neurological:     General: No focal deficit present.     Mental  Status: She is alert and oriented to person, place, and time.     Gait: Gait abnormal (uses rolling walker with seat ).  Psychiatric:        Mood and Affect: Mood normal.        Behavior: Behavior normal.        Thought Content: Thought content normal.        Judgment: Judgment normal.     Labs reviewed:  No recent visible in care everywhere either Basic Metabolic Panel: No results for input(s): NA, K, CL, CO2, GLUCOSE, BUN, CREATININE, CALCIUM, MG, PHOS in the last 8760 hours. Liver Function Tests: No results for input(s): AST, ALT, ALKPHOS, BILITOT, PROT, ALBUMIN in the last 8760 hours. No results for input(s): LIPASE, AMYLASE in the last 8760 hours. No results for input(s): AMMONIA in the last 8760 hours. CBC: No results for input(s): WBC, NEUTROABS, HGB, HCT, MCV, PLT in the last 8760 hours. Cardiac  Enzymes: No results for input(s): CKTOTAL, CKMB, CKMBINDEX, TROPONINI in the last 8760 hours. BNP: Invalid input(s): POCBNP Lab Results  Component Value Date   HGBA1C 5.8 (H) 04/23/2014   Lab Results  Component Value Date   TSH 1.630 10/02/2016   Lab Results  Component Value Date   VITAMINB12 230 (L) 04/08/2017   No results found for: FOLATE No results found for: IRON, TIBC, FERRITIN  Imaging and Procedures noted on new patient packet: Says cscope 1987 with Dr. Kinnie Scales 2019 mammogram and bone density  2000 pap smear Last CT/MR kidneys Feb of '20 but I only have the 05/06/17 one that she brought a copy of with right kidney mass and left kidney cysts reportedly stable per radiology  Assessment/Plan 1. Irritable bowel syndrome with diarrhea -recommended daily metamucil and continued hydration to try to bulk stools to see if that will help her incontinence   2. NEPHROLITHIASIS, HX OF - was once followed at [facility], but then went to Dr. Beverely Pace who since left the area -had staghorn calculi - Ambulatory referral to Urology  3. Recurrent UTI -on daily keflex for prophylaxis from Dr. Beverely Pace who she was previously seeing about this and her kidney stones and kidney mass - Ambulatory referral to Urology  4. Vitamin D deficiency -cont D3 supplement  5. Lower leg edema - with severe discoloration and diminished pulses which concern me -referred for arterial doppler with ABIs - US ARTERIAL SEG MULTIPLE LE (ABI, SEGMENTAL PRESSURES, PVR'S); Future  6. Right kidney mass -needs f/u CT kidneys and referral to urology--will start with the urology referral and they can order their preferred imaging modality to reassess this--appears it's been stable for 3 years from what pt is saying  7. Ruptured right breast implant, sequela - MM 3D SCREEN BREAST W/IMPLANT BILATERAL; Future  8. Ruptured left breast implant, sequela - MM 3D SCREEN BREAST W/IMPLANT BILATERAL; Future  9.  Encounter for screening mammogram for malignant neoplasm of breast - MM 3D SCREEN BREAST W/IMPLANT BILATERAL; Future  10.  Postherpetic neuralgia -not terrible, but remains on posterior scalp and neck area  -she does not want pills for this  -does want to get shingrix vaccine series and advised this is done at the pharmacy for prescription drug coverage and since shingles began early march, she no longer has vesicles and I suggest she get her vaccines asap to prevent recurrence  At least 2 hours were spent with this patient today.  Labs/tests ordered:   Orders Placed This Encounter  Procedures  . US ARTERIAL SEG  MULTIPLE LE (ABI, SEGMENTAL PRESSURES, PVR'S)    196 / uhc mcr / walker / fh w lisa / no to covid q's  ORDER CHECKED 07/12/19/cina    Standing Status:   Future    Standing Expiration Date:   09/11/2020    Order Specific Question:   Reason for Exam (SYMPTOM  OR DIAGNOSIS REQUIRED)    Answer:   purple feet, decreased pulse right leg    Order Specific Question:   Preferred imaging location?    Answer:   [id number]    Order Specific Question:   Release to patient    Answer:   Immediate  . MM 3D SCREEN BREAST W/IMPLANT BILATERAL    Standing Status:   Future    Standing Expiration Date:   09/14/2020    Order Specific Question:   Reason for Exam (SYMPTOM  OR DIAGNOSIS REQUIRED)    Answer:   breast cancer screening    Order Specific Question:   Preferred imaging location?    Answer:   [facility]    Order Specific Question:   Release to patient    Answer:   Immediate  . Ambulatory referral to Urology    Referral Priority:   Routine    Referral Type:   Consultation    Referral Reason:   Specialty Services Required    Requested Specialty:   Urology    Number of Visits Requested:   1    08/17/2019 for CPE with MMSE--need records before this so I can determine what labs are needed  Ashauna Bertholf L. Dotty Gonzalo, D.O. [facility] [address],  [address] Cell Phone (Mon-Fri 8am-5pm):  [phone] On Call:  [phone] & follow prompts after 5pm & weekends Office Phone:  [phone] Office Fax:  [phone]

## 2019-07-13 NOTE — Patient Instructions (Addendum)
Try metamucil daily for several days to see if it can bulk up your stools.    We'll order the CT of your kidneys to reassess the size of the mass--[facility].  I will refer you to [facility]--Dr. Marlou Porch or Dr. Berneice Heinrich.    We'll [facility] up [facility].  Be sure to get your advance care planning documents in order.  We can discuss more next time.  I recommend you pursue getting your shingrix series at the pharmacy to prevent more shingles.    I will send you for testing of the circulation of your legs.

## 2019-07-16 ENCOUNTER — Encounter: Payer: Self-pay | Admitting: Internal Medicine

## 2019-07-16 DIAGNOSIS — N2889 Other specified disorders of kidney and ureter: Secondary | ICD-10-CM | POA: Insufficient documentation

## 2019-07-16 DIAGNOSIS — T8543XS Leakage of breast prosthesis and implant, sequela: Secondary | ICD-10-CM | POA: Insufficient documentation

## 2019-07-16 DIAGNOSIS — B0229 Other postherpetic nervous system involvement: Secondary | ICD-10-CM | POA: Insufficient documentation

## 2019-07-17 ENCOUNTER — Other Ambulatory Visit: Payer: Self-pay | Admitting: Internal Medicine

## 2019-07-17 DIAGNOSIS — R300 Dysuria: Secondary | ICD-10-CM

## 2019-07-17 NOTE — Telephone Encounter (Signed)
rx sent to pharmacy by e-script  

## 2019-07-18 ENCOUNTER — Other Ambulatory Visit: Payer: Medicare Other

## 2019-07-20 ENCOUNTER — Ambulatory Visit
Admission: RE | Admit: 2019-07-20 | Discharge: 2019-07-20 | Disposition: A | Discharge status: Home / Self Care | Payer: Medicare Other | Source: Ambulatory Visit | Attending: Internal Medicine | Admitting: Internal Medicine

## 2019-07-20 DIAGNOSIS — R600 Localized edema: Secondary | ICD-10-CM

## 2019-07-24 ENCOUNTER — Encounter: Payer: Self-pay | Admitting: Internal Medicine

## 2019-07-25 ENCOUNTER — Telehealth: Payer: Self-pay | Admitting: *Deleted

## 2019-07-25 DIAGNOSIS — Z1231 Encounter for screening mammogram for malignant neoplasm of breast: Secondary | ICD-10-CM

## 2019-07-25 DIAGNOSIS — T8543XS Leakage of breast prosthesis and implant, sequela: Secondary | ICD-10-CM

## 2019-07-25 NOTE — Telephone Encounter (Signed)
Interesting b/c I ordered it the same way she had it last year.  New orders entered.  This is not due until late September.

## 2019-07-25 NOTE — Telephone Encounter (Signed)
[facility] called and stated that they received an order for Screening Mammogram but since patient has Ruptured Implants they Cannot do Screening but needs an order placed for Diagnostic Mammogram and Ultrasound. For both breast. Please Advise/Order.

## 2019-08-04 ENCOUNTER — Other Ambulatory Visit: Payer: Self-pay | Admitting: Internal Medicine

## 2019-08-04 DIAGNOSIS — T8543XS Leakage of breast prosthesis and implant, sequela: Secondary | ICD-10-CM

## 2019-08-04 DIAGNOSIS — Z1231 Encounter for screening mammogram for malignant neoplasm of breast: Secondary | ICD-10-CM

## 2019-08-17 ENCOUNTER — Other Ambulatory Visit: Payer: Self-pay

## 2019-08-17 ENCOUNTER — Encounter: Payer: Self-pay | Admitting: Internal Medicine

## 2019-08-17 ENCOUNTER — Ambulatory Visit (INDEPENDENT_AMBULATORY_CARE_PROVIDER_SITE_OTHER): Payer: Medicare Other | Admitting: Internal Medicine

## 2019-08-17 VITALS — BP 100/62

## 2019-08-17 DIAGNOSIS — B0229 Other postherpetic nervous system involvement: Secondary | ICD-10-CM

## 2019-08-17 DIAGNOSIS — R600 Localized edema: Secondary | ICD-10-CM | POA: Diagnosis not present

## 2019-08-17 DIAGNOSIS — Z87442 Personal history of urinary calculi: Secondary | ICD-10-CM

## 2019-08-17 DIAGNOSIS — N2889 Other specified disorders of kidney and ureter: Secondary | ICD-10-CM | POA: Diagnosis not present

## 2019-08-17 DIAGNOSIS — K580 Irritable bowel syndrome with diarrhea: Secondary | ICD-10-CM

## 2019-08-17 DIAGNOSIS — E559 Vitamin D deficiency, unspecified: Secondary | ICD-10-CM

## 2019-08-17 DIAGNOSIS — E6609 Other obesity due to excess calories: Secondary | ICD-10-CM

## 2019-08-17 DIAGNOSIS — Z7189 Other specified counseling: Secondary | ICD-10-CM

## 2019-08-17 DIAGNOSIS — Z6835 Body mass index (BMI) 35.0-35.9, adult: Secondary | ICD-10-CM

## 2019-08-17 NOTE — Progress Notes (Signed)
Location:  [facility] clinic Provider:  Gracious Renken L. Renato Gails, D.O., C.M.D.  Code Status: DNR as discussed today--"If I die, just let me go" Goals of Care:  Advanced Directives 07/13/2019  Does Patient Have a Medical Advance Directive? No  Type of Advance Directive -  Copy of Healthcare Power of Attorney in Chart? -  Would patient like information on creating a medical advance directive? No - Patient declined;Yes (ED - Information included in AVS)     Chief Complaint  Patient presents with  . Follow-up    4 week follow-up     HPI: Patient is a 85 y.o. female seen today for medical management of chronic diseases.   Notes made in word while CHL was down and copied into this note by me: Knees, hips, back keep her from doing what she'd like  Purple feet--says had ABIs and she was ok  Discussed ACP: Organ donation ok Had paperwork drawn up before her staghorn calculus kidney surgery which was serious Had HCPOA and living will drawn up then [name], her long-time partner, is not medically inclined, but cares about her and she wants him involved in the decision-making.  She is afraid he would not be able to let her go.  Youngest son is the closest to medically inclined and he lives in [place] but she does not see him years at a time and he doesn't do well about answering her phone calls.  Many of her friends have died and the other children are not medically inclined.   She wants to take home the MOST form and decide what to do and not do.  She does not want to smother to death.  When she was a child, she had a choking accident where she could not breathe and has a lifetime dedication to nothing interfering with her breathing.  Wants to be kept out of pain as much as possible even if it means she might succumb to the effects of the drug.  Says if she had a near death experience and was kept alive, she does not have anything she's going to come back and do.  She cannot creative cook, drive, do things  now.  Says if she dies, let her go.  The computer is now unplugged.  She is not certain what happens after death--thinks it's just the big sleep.    She has a very dry mouth so has not done the 23 and me test her son wanted her to do.  Does drink about 48 oz water per day.  Has three 16.9oz bottles due to her kidney stones.  She has an appt at [facility].  She will see Dr. Marlou Porch.  Has appt at [facility] 6/2 for mammogram.   Past Medical History:  Diagnosis Date  . Allergy    environmental  . Anemia   . Arthritis   . Asthma   . Chronic diarrhea 1986   since cholecystecomy  . Esophageal reflux   . H/O orthostatic hypotension   . 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 Migraine   . Urge incontinence of urine    followed by Urology - Dr. Wilburn Mylar  Past Surgical History:  Procedure Laterality Date  . ABLATION  2005   endovenous  . APPENDECTOMY    . AUGMENTATION MAMMAPLASTY Bilateral 1971   [facility] Dr. Tyrell Antonio  . CHOLECYSTECTOMY  1986   chronic diarrhea since  . COSMETIC SURGERY    . LITHOTRIPSY Bilateral 2006  . PERCUTANEOUS NEPHROSTOLITHOTOMY Left 06/30/2011   w/ cystoscopy by Dr. Karena Addison at [facility] (and poss ureteral stent placement?)  . PERCUTANEOUS NEPHROSTOLITHOTOMY Left 07/07/2011   w/ ureteroscopy and stone manipulation Dr. Karena Addison at [facility]. Surg >4 hrs with gen anesthesia and pt noticed new permanent cognitive deficits after  . TUBAL LIGATION Bilateral 1968  . URETEROLITHOTOMY Right 12/17/2011   Dr. Karena Addison at [facility]  . URETEROSCOPY WITH HOLMIUM LASER LITHOTRIPSY Left 07/07/2011   by percutaneous  nephrostolithotomy with basket extraction, ureteral stent removal (placed 06/30/11?), and placement of Lt percutaneous nephrostomy tube by Dr. Karena Addison at [facility] for numerous >2cm staghorn calcium oxalate stones in left renal pelvis retained after initial perc nephrostolithotomy wiht stent placement 1 wk prior     Allergies  Allergen Reactions  . Azithromycin Other (See Comments)    thrush  . Latex Hives and Other (See Comments)    Lip swelling and redness Lip swelling and redness  . Other Rash  . Codeine   . Doxycycline   . Gluten Meal Diarrhea  . Tape Rash    Outpatient Encounter Medications as of 08/17/2019  Medication Sig  . cephALEXin (KEFLEX) 500 MG capsule TAKE 1 CAPSULE BY MOUTH EVERY DAY  . Cholecalciferol (VITAMIN D-3) 125 MCG (5000 UT) TABS Take 1 tablet by mouth daily.  . Liniments (SALONPAS ARTHRITIS PAIN RELIEF EX) Apply topically.  . nadolol (CORGARD) 40 MG tablet Take 40 mg by mouth daily.   No facility-administered encounter medications on file as of 08/17/2019.    Review of Systems:  Review of Systems  Constitutional: Positive for malaise/fatigue. Negative for chills and fever.  HENT: Negative for congestion.   Eyes: Negative for blurred vision.  Respiratory: Negative for cough.   Cardiovascular: Positive for leg swelling. Negative for chest pain and palpitations.  Gastrointestinal: Positive for diarrhea. Negative for abdominal pain, blood in stool, constipation and melena.  Genitourinary: Negative for dysuria.  Musculoskeletal: Positive for back pain and joint pain. Negative for falls.  Skin: Negative for itching and rash.  Neurological: Positive for sensory change. Negative for loss of consciousness.  Endo/Heme/Allergies: Bruises/bleeds easily.  Psychiatric/Behavioral: Negative for depression and memory loss. The patient is not nervous/anxious and does not have insomnia.     Health Maintenance  Topic Date Due  . INFLUENZA VACCINE  10/22/2019  .  TETANUS/TDAP  02/24/2023  . DEXA SCAN  Completed  . COVID-19 Vaccine  Completed  . PNA vac Low Risk Adult  Completed    Physical Exam: Vitals:   08/17/19 1416 08/17/19 1418  BP: (!) 94/58 100/62   There is no height or weight on file to calculate BMI. Physical Exam Vitals reviewed.  Constitutional:      General: She is not in acute distress.    Appearance: She is obese. She is not toxic-appearing.  HENT:     Head: Normocephalic and atraumatic.  Cardiovascular:     Rate and Rhythm: Normal rate and regular rhythm.     Pulses: Normal pulses.     Heart sounds: Normal heart sounds. No murmur.  Pulmonary:     Effort: Pulmonary effort is normal.     Breath sounds: Normal breath sounds. No rales.  Abdominal:     General: Bowel sounds are normal. There is no distension.     Palpations: Abdomen is soft.     Tenderness: There is no abdominal tenderness. There is no guarding or rebound.  Musculoskeletal:        General: Normal range of motion.     Cervical back: Neck supple.     Right lower leg: Edema present.     Left lower leg: Edema present.     Comments: Tender over bilateral SI areas  Skin:    General: Skin is warm and dry.     Comments: Toes remain purple blue/black and distal feet  Neurological:     General: No focal deficit present.     Mental Status: She is alert and oriented to person, place, and time.     Gait: Gait abnormal.     Comments: Very slow and struggles to get up out of chair, uses rollator walker with seat  Psychiatric:        Mood and Affect: Mood normal.        Behavior: Behavior normal.     Labs reviewed: Basic Metabolic Panel: Recent Labs    08/17/19 1425  NA 140  K 4.2  CL 106  CO2 27  GLUCOSE 100  BUN 17  CREATININE 0.75  CALCIUM 9.2  TSH 1.41   Liver Function Tests: Recent Labs    08/17/19 1425  AST 19  ALT 14  BILITOT 0.5  PROT 6.2   No results for input(s): LIPASE, AMYLASE in the last 8760 hours. No results for input(s):  AMMONIA in the last 8760 hours. CBC: Recent Labs    08/17/19 1425  WBC 7.6  NEUTROABS 4,659  HGB 13.4  HCT 42.1  MCV 88.3  PLT 189   Lipid Panel: Recent Labs    08/17/19 1425  CHOL 241*  HDL 46*  LDLCALC 155*  TRIG 237*  CHOLHDL 5.2*   Lab Results  Component Value Date   HGBA1C 5.3 08/17/2019    Procedures since last visit: MM DIAG BREAST W/IMPLANT TOMO BILATERAL  Result Date: 08/23/2019 CLINICAL DATA:  Patient had diagnostic study in 2018 which was consistent with intra and extra capsular implant rupture. Patient had a follow-up with her plastic surgeon and it was decided that the implants would not be explanted. Patient has no current complaints and does not wish to have the implants removed. EXAM: 2D DIGITAL DIAGNOSTIC BILATERAL MAMMOGRAM WITH IMPLANTS, CAD AND ADJUNCT TOMO The patient has retropectoral implants. Standard and implant displaced views were performed. COMPARISON:  Previous exam(s). ACR Breast Density Category b: There are scattered areas of fibroglandular density. FINDINGS: No suspicious mass, malignant type microcalcifications or distortion detected in either breast. Implants are unchanged in appearance compared to prior exam dated 12/10/2017. Mammographic images were processed with CAD. IMPRESSION: No evidence of malignancy in either breast. RECOMMENDATION: Bilateral screening mammogram in 1 year is recommended. I have discussed the findings and recommendations with the patient. If applicable, a reminder letter will be sent to the patient regarding the next appointment. BI-RADS CATEGORY  2: Benign. Electronically Signed   By: Lillia Mountain M.D.   On: 08/23/2019 15:49    Assessment/Plan 1. Advance care planning -spent 30 mins on ACP -has living will/hcpoa in place, but has to find and bring me copies--long-term partner, Jaquelyn Bitter, and son in [place] will be her representatives -she does not want her life artificially prolonged with poor prognosis -discussed MOST  some and she's going  to take it home and review and we can complete when she returns  2. NEPHROLITHIASIS, HX OF -staghorn calculi requiring surgery historically -does her best to hydrate and follow directions to avoid these -referred last time to [facility] and has appt to see Dr. Marlou Porch -on keflex prophylaxis  3. Right kidney mass -is to see Dr. Marlou Porch, has cyst on other kidney, had been stable over at least 2 CT scans previously  4. Lower leg edema - ongoing, ABIs negative for arterial blockage with her purple feet - Vitamin B12  5. Irritable bowel syndrome with diarrhea -ongoing, recommended fiber and adequate hydation  6. Class 2 obesity due to excess calories without serious comorbidity with body mass index (BMI) of 35.0 to 35.9 in adult -is not very physically active due to pain and deconditioning  - CBC with Differential/Platelet - COMPLETE METABOLIC PANEL WITH GFR - Hemoglobin A1c - Lipid panel - TSH  7. Neuralgia, postherpetic - uses salonpas arthritis on effected area and arthritic places -TSH - Vitamin B12  8. Vitamin D deficiency -f/u level:   VITAMIN D 25 Hydroxy (Vit-D Deficiency, Fractures)  Labs/tests ordered:   Orders Placed This Encounter  Procedures  . CBC with Differential/Platelet  . COMPLETE METABOLIC PANEL WITH GFR  . Hemoglobin A1c    Order Specific Question:   Release to patient    Answer:   Immediate  . Lipid panel    Order Specific Question:   Has the patient fasted?    Answer:   No    Order Specific Question:   Release to patient    Answer:   Immediate  . TSH  . Vitamin B12  . VITAMIN D 25 Hydroxy (Vit-D Deficiency, Fractures)    Order Specific Question:   Release to patient    Answer:   Immediate    Next appt:  12/18/2019  Pamila Mendibles L. Nekesha Font, D.O. Geriatrics [facility] [address], [place] [address] Cell Phone (Mon-Fri 8am-5pm):  [phone] On Call:  [phone] & follow  prompts after 5pm & weekends Office Phone:  [phone] Office Fax:  [phone]

## 2019-08-18 ENCOUNTER — Other Ambulatory Visit: Payer: Medicare Other

## 2019-08-18 ENCOUNTER — Telehealth: Payer: Self-pay

## 2019-08-18 ENCOUNTER — Other Ambulatory Visit: Payer: Self-pay | Admitting: Internal Medicine

## 2019-08-18 DIAGNOSIS — T8543XS Leakage of breast prosthesis and implant, sequela: Secondary | ICD-10-CM

## 2019-08-18 DIAGNOSIS — E559 Vitamin D deficiency, unspecified: Secondary | ICD-10-CM

## 2019-08-18 LAB — LIPID PANEL
Cholesterol: 241 mg/dL — ABNORMAL HIGH (ref <200)
HDL: 46 mg/dL — ABNORMAL LOW (ref >=50)
LDL Cholesterol (Calc): 155 mg/dL (calc) — ABNORMAL HIGH
Non-HDL Cholesterol (Calc): 195 mg/dL (calc) — ABNORMAL HIGH (ref <130)
Total CHOL/HDL Ratio: 5.2 (calc) — ABNORMAL HIGH (ref <5.0)
Triglycerides: 237 mg/dL — ABNORMAL HIGH (ref <150)

## 2019-08-18 LAB — CBC WITH DIFFERENTIAL/PLATELET
Absolute Monocytes: 798 cells/uL (ref 200–950)
Basophils Absolute: 23 cells/uL (ref 0–200)
Basophils Relative: 0.3 %
Eosinophils Absolute: 129 cells/uL (ref 15–500)
Eosinophils Relative: 1.7 %
HCT: 42.1 % (ref 35.0–45.0)
Hemoglobin: 13.4 g/dL (ref 11.7–15.5)
Lymphs Abs: 1991 cells/uL (ref 850–3900)
MCH: 28.1 pg (ref 27.0–33.0)
MCHC: 31.8 g/dL — ABNORMAL LOW (ref 32.0–36.0)
MCV: 88.3 fL (ref 80.0–100.0)
MPV: 10.2 fL (ref 7.5–12.5)
Monocytes Relative: 10.5 %
Neutro Abs: 4659 cells/uL (ref 1500–7800)
Neutrophils Relative %: 61.3 %
Platelets: 189 10*3/uL (ref 140–400)
RBC: 4.77 10*6/uL (ref 3.80–5.10)
RDW: 14.4 % (ref 11.0–15.0)
Total Lymphocyte: 26.2 %
WBC: 7.6 10*3/uL (ref 3.8–10.8)

## 2019-08-18 LAB — COMPLETE METABOLIC PANEL WITH GFR
AG Ratio: 1.7 (calc) (ref 1.0–2.5)
ALT: 14 U/L (ref 6–29)
AST: 19 U/L (ref 10–35)
Albumin: 3.9 g/dL (ref 3.6–5.1)
Alkaline phosphatase (APISO): 54 U/L (ref 37–153)
BUN: 17 mg/dL (ref 7–25)
CO2: 27 mmol/L (ref 20–32)
Calcium: 9.2 mg/dL (ref 8.6–10.4)
Chloride: 106 mmol/L (ref 98–110)
Creat: 0.75 mg/dL (ref 0.60–0.88)
GFR, Est African American: 86 mL/min/{1.73_m2} (ref >=60)
GFR, Est Non African American: 74 mL/min/{1.73_m2} (ref >=60)
Globulin: 2.3 g/dL (calc) (ref 1.9–3.7)
Glucose, Bld: 100 mg/dL (ref 65–139)
Potassium: 4.2 mmol/L (ref 3.5–5.3)
Sodium: 140 mmol/L (ref 135–146)
Total Bilirubin: 0.5 mg/dL (ref 0.2–1.2)
Total Protein: 6.2 g/dL (ref 6.1–8.1)

## 2019-08-18 LAB — TSH: TSH: 1.41 mIU/L (ref 0.40–4.50)

## 2019-08-18 LAB — HEMOGLOBIN A1C
Hgb A1c MFr Bld: 5.3 % of total Hgb (ref <5.7)
Mean Plasma Glucose: 105 (calc)
eAG (mmol/L): 5.8 (calc)

## 2019-08-18 LAB — VITAMIN D 25 HYDROXY (VIT D DEFICIENCY, FRACTURES): Vit D, 25-Hydroxy: 26 ng/mL — ABNORMAL LOW (ref 30–100)

## 2019-08-18 LAB — VITAMIN B12: Vitamin B-12: 229 pg/mL (ref 200–1100)

## 2019-08-18 MED ORDER — VITAMIN D (ERGOCALCIFEROL) 1.25 MG (50000 UNIT) PO CAPS: 50000.0000 [IU] | ORAL_CAPSULE | ORAL | 1 refills | Status: DC

## 2019-08-18 NOTE — Telephone Encounter (Signed)
Called patient with results.  

## 2019-08-18 NOTE — Progress Notes (Signed)
Blood counts normal Electrolytes, liver, kidneys normal Sugar average now normal (5 yrs ago was in prediabetes range) Cholesterol is high:  total, triglycerides and bad (LDL) all high--I know she is limited on exercise.  These levels typically require medications.  Has she had any tolerance problems with statins in the past?  Is she willing to take one? Thyroid normal B12 is low.  I recommend she take daily B12 supplement.  We can recheck at a later time to see if her level improves (sometimes people cannot absorb this and require injections). Vitamin D is still low at 26--(had been worse in the past at 15 2 yrs ago) I recommend she take the high dose vitamin D weekly for 12 weeks (Vitamin D 44315 units, then resume her 5000 units daily D3.  We will need to send in the high dose for her.   Repleting her two vitamins may help her energy levels.

## 2019-08-18 NOTE — Telephone Encounter (Signed)
She can decrease her intake of fried foods, fatty foods like desserts, candy, butter, cheese, red meat in favor of more vegetables.  That should help.  I know her ability to exercise is really limited, but even some chair exercise may also help. If she's going to start the b12, be sure to add it to her list, also. Thanks.

## 2019-08-18 NOTE — Telephone Encounter (Signed)
Results were given to patient. Patient is not opposed to taken statins, however she would like to know about any other options she has. Increased dose of Vitamin D sent into pharmacy.

## 2019-08-18 NOTE — Telephone Encounter (Signed)
-----   Message from Kermit Balo, DO sent at 08/18/2019  8:40 AM EDT ----- Blood counts normal Electrolytes, liver, kidneys normal Sugar average now normal (5 yrs ago was in prediabetes range) Cholesterol is high:  total, triglycerides and bad (LDL) all high--I know she is limited on exercise.  These levels typically require medications.  Has she had any tolerance problems with statins in the past?  Is she willing to take one? Thyroid normal B12 is low.  I recommend she take daily B12 supplement.  We can recheck at a later time to see if her level improves (sometimes people cannot absorb this and require injections). Vitamin D is still low at 26--(had been worse in the past at 15 2 yrs ago) I recommend she take the high dose vitamin D weekly for 12 weeks (Vitamin D 91675 units, then resume her 5000 units daily D3.  We will need to send in the high dose for her.   Repleting her two vitamins may help her energy levels.

## 2019-08-23 ENCOUNTER — Ambulatory Visit: Payer: Medicare Other

## 2019-08-23 ENCOUNTER — Ambulatory Visit
Admission: RE | Admit: 2019-08-23 | Discharge: 2019-08-23 | Disposition: A | Discharge status: Home / Self Care | Payer: Medicare Other | Source: Ambulatory Visit | Attending: Internal Medicine | Admitting: Internal Medicine

## 2019-08-23 ENCOUNTER — Other Ambulatory Visit: Payer: Self-pay

## 2019-08-23 DIAGNOSIS — T8543XS Leakage of breast prosthesis and implant, sequela: Secondary | ICD-10-CM

## 2019-08-24 NOTE — Progress Notes (Signed)
Negative mammogram

## 2019-10-09 ENCOUNTER — Other Ambulatory Visit: Payer: Self-pay | Admitting: *Deleted

## 2019-10-09 MED ORDER — NADOLOL 40 MG PO TABS: 40.0000 mg | ORAL_TABLET | Freq: Every day | ORAL | 1 refills | Status: DC

## 2019-10-09 NOTE — Telephone Encounter (Signed)
Patient requested refill Pended Rx and sent to Dr. Renato Gails for approval. Patient stated that her previous PCP prescribed.

## 2019-12-11 ENCOUNTER — Encounter: Payer: Self-pay | Admitting: Internal Medicine

## 2019-12-11 DIAGNOSIS — R739 Hyperglycemia, unspecified: Secondary | ICD-10-CM | POA: Insufficient documentation

## 2019-12-18 ENCOUNTER — Other Ambulatory Visit: Payer: Self-pay

## 2019-12-18 ENCOUNTER — Encounter: Payer: Self-pay | Admitting: Internal Medicine

## 2019-12-18 ENCOUNTER — Ambulatory Visit (INDEPENDENT_AMBULATORY_CARE_PROVIDER_SITE_OTHER): Payer: Medicare Other | Admitting: Internal Medicine

## 2019-12-18 VITALS — BP 122/70 | HR 59 | Temp 98.4°F | Ht 62.0 in | Wt 199.6 lb

## 2019-12-18 DIAGNOSIS — Z23 Encounter for immunization: Secondary | ICD-10-CM

## 2019-12-18 DIAGNOSIS — E782 Mixed hyperlipidemia: Secondary | ICD-10-CM | POA: Diagnosis not present

## 2019-12-18 DIAGNOSIS — E6609 Other obesity due to excess calories: Secondary | ICD-10-CM | POA: Diagnosis not present

## 2019-12-18 DIAGNOSIS — N2889 Other specified disorders of kidney and ureter: Secondary | ICD-10-CM

## 2019-12-18 DIAGNOSIS — Z6835 Body mass index (BMI) 35.0-35.9, adult: Secondary | ICD-10-CM

## 2019-12-18 DIAGNOSIS — K580 Irritable bowel syndrome with diarrhea: Secondary | ICD-10-CM

## 2019-12-18 DIAGNOSIS — Z87442 Personal history of urinary calculi: Secondary | ICD-10-CM

## 2019-12-18 MED ORDER — ROSUVASTATIN CALCIUM 5 MG PO TABS: 5.0000 mg | ORAL_TABLET | Freq: Every day | ORAL | 3 refills | Status: DC

## 2019-12-18 NOTE — Progress Notes (Signed)
Location:  [facility] clinic Provider:  Khylie Larmore L. Renato Gails, D.O., C.M.D.  Goals of Care:  Advanced Directives 07/13/2019  Does Patient Have a Medical Advance Directive? No  Type of Advance Directive -  Copy of Healthcare Power of Attorney in Chart? -  Would patient like information on creating a medical advance directive? No - Patient declined;Yes (ED - Information included in AVS)     Chief Complaint  Patient presents with  . Medical Management of Chronic Issues    4 month follow up   . Health Maintenance    Influenza   . Acute Visit    ?'s about the flu and booster before getting     HPI: Patient is a 85 y.o. female seen today for medical management of chronic diseases.    She really wants her covid booster--discussed at least a 2 week time frame separation b/w it and flu vaccine.      Hyperlipidemia:  She does eat eggs almost every morning and she tries to get enough protein--primarily with chicken, some fish.  Not much red meat--once in a while, she'll cook chili.  LDL was 155, TG 237.  She does have IBS diarrhea and eats rice to compensate.  She does like to eat oatmeal.  Standing up is challenging for her for 5-10 mins.  Thru her groin and her back.  Has had bad experiences with friends who had surgery for their backs.  She is very sedentary.  She agrees to try crestor.   Had normal mammogram in June.  Prior to her kidney stone surgery but after her stent was removed--she had urgency, where she could not wait until she'd finish a task.  She then started to have full incontinence after that.  She has been wearing depends since 2013.    She did go to Dr. Marlou Porch on 6/15--he recommended one year f/u.  Did not feel like mass on right kidney had changed.    Vitamin D had been low and she remains on repletion.   Past Medical History:  Diagnosis Date  . Allergy    environmental  . Anemia   . Arthritis   . Asthma   . Chronic diarrhea 1986   since cholecystecomy  . Esophageal reflux    . H/O orthostatic hypotension   . 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 Migraine   . Urge incontinence of urine    followed by Urology - Dr. Wilburn Mylar    Past Surgical History:  Procedure Laterality Date  . ABLATION  2005   endovenous  . APPENDECTOMY    . AUGMENTATION MAMMAPLASTY Bilateral 1971   [facility] Dr. Tyrell Antonio  . CHOLECYSTECTOMY  1986   chronic diarrhea since  . COSMETIC SURGERY    . LITHOTRIPSY Bilateral 2006  . PERCUTANEOUS NEPHROSTOLITHOTOMY Left 06/30/2011   w/ cystoscopy by Dr. Karena Addison at [facility] (and poss ureteral stent placement?)  . PERCUTANEOUS NEPHROSTOLITHOTOMY Left 07/07/2011   w/ ureteroscopy and stone manipulation Dr. Karena Addison at [facility]. Surg >4 hrs with gen anesthesia and pt noticed new permanent cognitive deficits after  . TUBAL LIGATION Bilateral 1968  . URETEROLITHOTOMY Right 12/17/2011   Dr. Karena Addison at  [facility]  . URETEROSCOPY WITH HOLMIUM LASER LITHOTRIPSY Left 07/07/2011   by percutaneous nephrostolithotomy with basket extraction, ureteral stent removal (placed 06/30/11?), and placement of Lt percutaneous nephrostomy tube by Dr. Karena Addison at [facility] for numerous >2cm staghorn calcium oxalate stones in left renal pelvis retained after initial perc nephrostolithotomy wiht stent placement 1 wk prior     Allergies  Allergen Reactions  . Azithromycin Other (See Comments)    thrush  . Latex Hives and Other (See Comments)    Lip swelling and redness Lip swelling and redness  . Other Rash  . Codeine   . Doxycycline   . Gluten Meal Diarrhea  . Tape Rash    Outpatient Encounter Medications as  of 12/18/2019  Medication Sig  . cephALEXin (KEFLEX) 500 MG capsule TAKE 1 CAPSULE BY MOUTH EVERY DAY  . Liniments (SALONPAS ARTHRITIS PAIN RELIEF EX) Apply topically.  . nadolol (CORGARD) 40 MG tablet Take 1 tablet (40 mg total) by mouth daily.  . Vitamin D, Ergocalciferol, (DRISDOL) 1.25 MG (50000 UNIT) CAPS capsule Take 1 capsule (50,000 Units total) by mouth every 7 (seven) days.  . [DISCONTINUED] Cholecalciferol (VITAMIN D-3) 125 MCG (5000 UT) TABS Take 1 tablet by mouth daily.   No facility-administered encounter medications on file as of 12/18/2019.    Review of Systems:  Review of Systems  Constitutional: Positive for malaise/fatigue. Negative for chills and fever.  HENT: Negative for congestion, hearing loss and sore throat.   Eyes: Negative for blurred vision.  Respiratory: Negative for cough and shortness of breath.   Cardiovascular: Positive for leg swelling. Negative for chest pain and palpitations.  Gastrointestinal: Negative for abdominal pain, blood in stool, diarrhea and melena.  Genitourinary: Positive for urgency. Negative for dysuria, flank pain and hematuria.       Incontinence  Musculoskeletal: Positive for back pain, joint pain and myalgias. Negative for falls.  Skin: Negative for itching and rash.  Neurological: Positive for tingling, sensory change and weakness. Negative for dizziness and loss of consciousness.  Endo/Heme/Allergies: Bruises/bleeds easily.  Psychiatric/Behavioral: Negative for depression and memory loss. The patient is not nervous/anxious and does not have insomnia.     Health Maintenance  Topic Date Due  . INFLUENZA VACCINE  10/22/2019  . TETANUS/TDAP  02/24/2023  . DEXA SCAN  Completed  . COVID-19 Vaccine  Completed  . PNA vac Low Risk Adult  Completed    Physical Exam: Vitals:   12/18/19 1527  Weight: 199 lb 9.6 oz (90.5 kg)  Height: 5\' 2"  (1.575 m)   Body mass index is 36.51 kg/m. Physical Exam Vitals reviewed.  Constitutional:       Appearance: Normal appearance.  Cardiovascular:     Rate and Rhythm: Normal rate and regular rhythm.     Pulses: Normal pulses.     Heart sounds: Normal heart sounds.     Comments: Varicose veins and purple discoloration of legs, purple-blue (with normal abis!), edema Pulmonary:     Effort: Pulmonary effort is normal.     Breath sounds: Normal breath sounds. No wheezing, rhonchi or rales.  Abdominal:     General: Bowel sounds are normal. There is no distension.     Palpations: Abdomen is soft.     Tenderness: There is no abdominal tenderness. There is no guarding or rebound.  Musculoskeletal:        General: Normal range of motion.     Cervical back: Neck supple.     Right lower leg: Edema present.  Left lower leg: Edema present.  Skin:    General: Skin is warm and dry.  Neurological:     General: No focal deficit present.     Mental Status: She is alert and oriented to person, place, and time.     Motor: Weakness present.     Gait: Gait abnormal.     Comments: Uses rollator with seat  Psychiatric:        Mood and Affect: Mood normal.     Labs reviewed: Basic Metabolic Panel: Recent Labs    08/17/19 1425  NA 140  K 4.2  CL 106  CO2 27  GLUCOSE 100  BUN 17  CREATININE 0.75  CALCIUM 9.2  TSH 1.41   Liver Function Tests: Recent Labs    08/17/19 1425  AST 19  ALT 14  BILITOT 0.5  PROT 6.2   No results for input(s): LIPASE, AMYLASE in the last 8760 hours. No results for input(s): AMMONIA in the last 8760 hours. CBC: Recent Labs    08/17/19 1425  WBC 7.6  NEUTROABS 4,659  HGB 13.4  HCT 42.1  MCV 88.3  PLT 189   Lipid Panel: Recent Labs    08/17/19 1425  CHOL 241*  HDL 46*  LDLCALC 155*  TRIG 237*  CHOLHDL 5.2*   Lab Results  Component Value Date   HGBA1C 5.3 08/17/2019    Assessment/Plan 1. Class 2 obesity due to excess calories without serious comorbidity with body mass index (BMI) of 35.0 to 35.9 in adult - ongoing, is unable  to really exercise with her back and knee pain--vicious cycle, encouraged healthier diet - Lipid panel; Future - BASIC METABOLIC PANEL WITH GFR; Future - CBC with Differential/Platelet; Future  2. Hyperlipidemia, mixed - spent much of today's visit counseling about healthy diet low in sodium and cholesterol--handout provided as well, agreed to start on medication--I hope she tolerates b/c I doubt she'll get to goal with diet alone considering her lack of mobility - rosuvastatin (CRESTOR) 5 MG tablet; Take 1 tablet (5 mg total) by mouth daily.  Dispense: 90 tablet; Refill: 3 - Lipid panel; Future  3. NEPHROLITHIASIS, HX OF - no recent complications, followed by urology - BASIC METABOLIC PANEL WITH GFR; Future  4. Irritable bowel syndrome with diarrhea -continues to be problematic for her back and forth  5. Right kidney mass - being monitored and was stable on last eval - BASIC METABOLIC PANEL WITH GFR; Future - CBC with Differential/Platelet; Future  6. Need for influenza vaccination - Flu Vaccine QUAD High Dose(Fluad)  Labs/tests ordered:  * No order type specified * Next appt:  Visit date not found   Katianne Barre L. Chastin Garlitz, D.O. Geriatrics [facility] [address], [place] [address] Cell Phone (Mon-Fri 8am-5pm):  [phone] On Call:  [phone] & follow prompts after 5pm & weekends Office Phone:  [phone] Office Fax:  [phone]

## 2019-12-18 NOTE — Patient Instructions (Addendum)
Let's start you on crestor in the evening with your other medications.  If you develop muscle cramps or new weakness, let me know.  We may need to add CoQ10 or decrease the frequency of the crestor to just three times a week.    Try some compression hose with zippers in them to help your swelling on in the morning and off at night.   Fat and Cholesterol Restricted Eating Plan Eating a diet that limits fat and cholesterol may help lower your risk for heart disease and other conditions. Your body needs fat and cholesterol for basic functions, but eating too much of these things can be harmful to your health. Your health care provider may order lab tests to check your blood fat (lipid) and cholesterol levels. This helps your health care provider understand your risk for certain conditions and whether you need to make diet changes. Work with your health care provider or dietitian to make an eating plan that is right for you.  What are tips for following this plan? General guidelines   If you are overweight, work with your health care provider to lose weight safely. Losing just 5-10% of your body weight can improve your overall health and help prevent diseases such as diabetes and heart disease.  Avoid: ? Foods with added sugar. ? Fried foods. ? Foods that contain partially hydrogenated oils, including stick margarine, some tub margarines, cookies, crackers, and other baked goods.  Limit alcohol intake to no more than 1 drink a day for nonpregnant women and 2 drinks a day for men. One drink equals 12 oz of beer, 5 oz of wine, or 1 oz of hard liquor. Reading food labels  Check food labels for: ? Trans fats, partially hydrogenated oils, or high amounts of saturated fat. Avoid foods that contain saturated fat and trans fat. ? The amount of cholesterol in each serving. Try to eat no more than 200 mg of cholesterol each day. ? The amount of fiber in each serving. Try to eat at least 20-30 g of fiber  each day.  Choose foods with healthy fats, such as: ? Monounsaturated and polyunsaturated fats. These include olive and canola oil, flaxseeds, walnuts, almonds, and seeds. ? Omega-3 fats. These are found in foods such as salmon, mackerel, sardines, tuna, flaxseed oil, and ground flaxseeds.  Choose grain products that have whole grains. Look for the word "whole" as the first word in the ingredient list. Cooking  Cook foods using methods other than frying. Baking, boiling, grilling, and broiling are some healthy options.  Eat more home-cooked food and less restaurant, buffet, and fast food.  Avoid cooking using saturated fats. ? Animal sources of saturated fats include meats, butter, and cream. ? Plant sources of saturated fats include palm oil, palm kernel oil, and coconut oil. Meal planning   At meals, imagine dividing your plate into fourths: ? Fill one-half of your plate with vegetables and green salads. ? Fill one-fourth of your plate with whole grains. ? Fill one-fourth of your plate with lean protein foods.  Eat fish that is high in omega-3 fats at least two times a week.  Eat more foods that contain fiber, such as whole grains, beans, apples, broccoli, carrots, peas, and barley. These foods help promote healthy cholesterol levels in the blood. Recommended foods Grains  Whole grains, such as whole wheat or whole grain breads, crackers, cereals, and pasta. Unsweetened oatmeal, bulgur, barley, quinoa, or brown rice. Corn or whole wheat flour tortillas. Vegetables  Fresh or frozen vegetables (raw, steamed, roasted, or grilled). Green salads. Fruits  All fresh, canned (in natural juice), or frozen fruits. Meats and other protein foods  Ground beef (85% or leaner), grass-fed beef, or beef trimmed of fat. Skinless chicken or [place]. Ground chicken or [place]. Pork trimmed of fat. All fish and seafood. Egg whites. Dried beans, peas, or lentils. Unsalted nuts or seeds. Unsalted  canned beans. Natural nut butters without added sugar and oil. Dairy  Low-fat or nonfat dairy products, such as skim or 1% milk, 2% or reduced-fat cheeses, low-fat and fat-free ricotta or cottage cheese, or plain low-fat and nonfat yogurt. Fats and oils  Tub margarine without trans fats. Light or reduced-fat mayonnaise and salad dressings. Avocado. Olive, canola, sesame, or safflower oils. The items listed above may not be a complete list of recommended foods or beverages. Contact your dietitian for more options. Foods to avoid Grains  White bread. White pasta. White rice. Cornbread. Bagels, pastries, and croissants. Crackers and snack foods that contain trans fat and hydrogenated oils. Vegetables  Vegetables cooked in cheese, cream, or butter sauce. Fried vegetables. Fruits  Canned fruit in heavy syrup. Fruit in cream or butter sauce. Fried fruit. Meats and other protein foods  Fatty cuts of meat. Ribs, chicken wings, bacon, sausage, bologna, salami, chitterlings, fatback, hot dogs, bratwurst, and packaged lunch meats. Liver and organ meats. Whole eggs and egg yolks. Chicken and [place] with skin. Fried meat. Dairy  Whole or 2% milk, cream, half-and-half, and cream cheese. Whole milk cheeses. Whole-fat or sweetened yogurt. Full-fat cheeses. Nondairy creamers and whipped toppings. Processed cheese, cheese spreads, and cheese curds. Beverages  Alcohol. Sugar-sweetened drinks such as sodas, lemonade, and fruit drinks. Fats and oils  Butter, stick margarine, lard, shortening, ghee, or bacon fat. Coconut, palm kernel, and palm oils. Sweets and desserts  Corn syrup, sugars, honey, and molasses. Candy. Jam and jelly. Syrup. Sweetened cereals. Cookies, pies, cakes, donuts, muffins, and ice cream. The items listed above may not be a complete list of foods and beverages to avoid. Contact your dietitian for more information. Summary  Your body needs fat and cholesterol for basic functions.  However, eating too much of these things can be harmful to your health.  Work with your health care provider and dietitian to follow a diet low in fat and cholesterol. Doing this may help lower your risk for heart disease and other conditions.  Choose healthy fats, such as monounsaturated and polyunsaturated fats, and foods high in omega-3 fatty acids.  Eat fiber-rich foods, such as whole grains, beans, peas, fruits, and vegetables.  Limit or avoid alcohol, fried foods, and foods high in saturated fats, partially hydrogenated oils, and sugar. This information is not intended to replace advice given to you by your health care provider. Make sure you discuss any questions you have with your health care provider. Document Revised: 02/19/2017 Document Reviewed: 11/24/2016 Elsevier Patient Education  2020 [employer].

## 2020-01-09 ENCOUNTER — Other Ambulatory Visit: Payer: Self-pay | Admitting: Internal Medicine

## 2020-01-09 DIAGNOSIS — E559 Vitamin D deficiency, unspecified: Secondary | ICD-10-CM

## 2020-03-29 ENCOUNTER — Other Ambulatory Visit: Payer: Self-pay | Admitting: Internal Medicine

## 2020-04-15 ENCOUNTER — Other Ambulatory Visit: Payer: Medicare Other

## 2020-04-15 ENCOUNTER — Other Ambulatory Visit: Payer: Self-pay

## 2020-04-15 DIAGNOSIS — Z87442 Personal history of urinary calculi: Secondary | ICD-10-CM

## 2020-04-15 DIAGNOSIS — E6609 Other obesity due to excess calories: Secondary | ICD-10-CM

## 2020-04-15 DIAGNOSIS — E782 Mixed hyperlipidemia: Secondary | ICD-10-CM

## 2020-04-15 DIAGNOSIS — E66812 Obesity, class 2: Secondary | ICD-10-CM

## 2020-04-15 DIAGNOSIS — N2889 Other specified disorders of kidney and ureter: Secondary | ICD-10-CM

## 2020-04-16 LAB — BASIC METABOLIC PANEL WITH GFR
BUN: 12 mg/dL (ref 7–25)
CO2: 27 mmol/L (ref 20–32)
Calcium: 8.9 mg/dL (ref 8.6–10.4)
Chloride: 107 mmol/L (ref 98–110)
Creat: 0.75 mg/dL (ref 0.60–0.88)
GFR, Est African American: 85 mL/min/{1.73_m2} (ref >=60)
GFR, Est Non African American: 74 mL/min/{1.73_m2} (ref >=60)
Glucose, Bld: 110 mg/dL — ABNORMAL HIGH (ref 65–99)
Potassium: 4.1 mmol/L (ref 3.5–5.3)
Sodium: 142 mmol/L (ref 135–146)

## 2020-04-16 LAB — CBC WITH DIFFERENTIAL/PLATELET
Absolute Monocytes: 450 cells/uL (ref 200–950)
Basophils Absolute: 20 cells/uL (ref 0–200)
Basophils Relative: 0.4 %
Eosinophils Absolute: 140 cells/uL (ref 15–500)
Eosinophils Relative: 2.8 %
HCT: 44.7 % (ref 35.0–45.0)
Hemoglobin: 14.2 g/dL (ref 11.7–15.5)
Lymphs Abs: 1765 cells/uL (ref 850–3900)
MCH: 27.8 pg (ref 27.0–33.0)
MCHC: 31.8 g/dL — ABNORMAL LOW (ref 32.0–36.0)
MCV: 87.5 fL (ref 80.0–100.0)
MPV: 10.2 fL (ref 7.5–12.5)
Monocytes Relative: 9 %
Neutro Abs: 2625 cells/uL (ref 1500–7800)
Neutrophils Relative %: 52.5 %
Platelets: 214 10*3/uL (ref 140–400)
RBC: 5.11 10*6/uL — ABNORMAL HIGH (ref 3.80–5.10)
RDW: 14.1 % (ref 11.0–15.0)
Total Lymphocyte: 35.3 %
WBC: 5 10*3/uL (ref 3.8–10.8)

## 2020-04-16 LAB — LIPID PANEL
Cholesterol: 221 mg/dL — ABNORMAL HIGH (ref <200)
HDL: 46 mg/dL — ABNORMAL LOW (ref >=50)
LDL Cholesterol (Calc): 137 mg/dL (calc) — ABNORMAL HIGH
Non-HDL Cholesterol (Calc): 175 mg/dL (calc) — ABNORMAL HIGH (ref <130)
Total CHOL/HDL Ratio: 4.8 (calc) (ref <5.0)
Triglycerides: 235 mg/dL — ABNORMAL HIGH (ref <150)

## 2020-04-16 NOTE — Progress Notes (Signed)
Labs are ok except cholesterol, but it did improve a bit from last time--we'll discuss when she comes in.

## 2020-04-18 ENCOUNTER — Ambulatory Visit (INDEPENDENT_AMBULATORY_CARE_PROVIDER_SITE_OTHER): Payer: Medicare Other | Admitting: Internal Medicine

## 2020-04-18 ENCOUNTER — Encounter: Payer: Self-pay | Admitting: Internal Medicine

## 2020-04-18 ENCOUNTER — Other Ambulatory Visit: Payer: Self-pay

## 2020-04-18 VITALS — BP 122/82 | HR 57 | Temp 97.7°F | Ht 62.0 in | Wt 200.0 lb

## 2020-04-18 DIAGNOSIS — Z7189 Other specified counseling: Secondary | ICD-10-CM | POA: Diagnosis not present

## 2020-04-18 DIAGNOSIS — R682 Dry mouth, unspecified: Secondary | ICD-10-CM

## 2020-04-18 DIAGNOSIS — R739 Hyperglycemia, unspecified: Secondary | ICD-10-CM

## 2020-04-18 DIAGNOSIS — E782 Mixed hyperlipidemia: Secondary | ICD-10-CM | POA: Diagnosis not present

## 2020-04-18 DIAGNOSIS — R600 Localized edema: Secondary | ICD-10-CM

## 2020-04-18 DIAGNOSIS — Z87442 Personal history of urinary calculi: Secondary | ICD-10-CM

## 2020-04-18 DIAGNOSIS — K580 Irritable bowel syndrome with diarrhea: Secondary | ICD-10-CM

## 2020-04-18 MED ORDER — NYSTATIN-TRIAMCINOLONE 100000-0.1 UNIT/GM-% EX OINT: 1.0000 "application " | TOPICAL_OINTMENT | Freq: Two times a day (BID) | CUTANEOUS | 3 refills | Status: DC

## 2020-04-18 NOTE — Patient Instructions (Signed)
Fat and Cholesterol Restricted Eating Plan Eating a diet that limits fat and cholesterol may help lower your risk for heart disease and other conditions. Your body needs fat and cholesterol for basic functions, but eating too much of these things can be harmful to your health. Your health care provider may order lab tests to check your blood fat (lipid) and cholesterol levels. This helps your health care provider understand your risk for certain conditions and whether you need to make diet changes. Work with your health care provider or dietitian to make an eating plan that is right for you. What are tips for following this plan? General guidelines  If you are overweight, work with your health care provider to lose weight safely. Losing just 5-10% of your body weight can improve your overall health and help prevent diseases such as diabetes and heart disease.  Avoid: ? Foods with added sugar. ? Fried foods. ? Foods that contain partially hydrogenated oils, including stick margarine, some tub margarines, cookies, crackers, and other baked goods.  Limit alcohol intake to no more than 1 drink a day for nonpregnant women and 2 drinks a day for men. One drink equals 12 oz of beer, 5 oz of wine, or 1 oz of hard liquor.   Reading food labels  Check food labels for: ? Trans fats, partially hydrogenated oils, or high amounts of saturated fat. Avoid foods that contain saturated fat and trans fat. ? The amount of cholesterol in each serving. Try to eat no more than 200 mg of cholesterol each day. ? The amount of fiber in each serving. Try to eat at least 20-30 g of fiber each day.  Choose foods with healthy fats, such as: ? Monounsaturated and polyunsaturated fats. These include olive and canola oil, flaxseeds, walnuts, almonds, and seeds. ? Omega-3 fats. These are found in foods such as salmon, mackerel, sardines, tuna, flaxseed oil, and ground flaxseeds.  Choose grain products that have whole  grains. Look for the word "whole" as the first word in the ingredient list. Cooking  Cook foods using methods other than frying. Baking, boiling, grilling, and broiling are some healthy options.  Eat more home-cooked food and less restaurant, buffet, and fast food.  Avoid cooking using saturated fats. ? Animal sources of saturated fats include meats, butter, and cream. ? Plant sources of saturated fats include palm oil, palm kernel oil, and coconut oil. Meal planning  At meals, imagine dividing your plate into fourths: ? Fill one-half of your plate with vegetables and green salads. ? Fill one-fourth of your plate with whole grains. ? Fill one-fourth of your plate with lean protein foods.  Eat fish that is high in omega-3 fats at least two times a week.  Eat more foods that contain fiber, such as whole grains, beans, apples, broccoli, carrots, peas, and barley. These foods help promote healthy cholesterol levels in the blood.   Recommended foods Grains  Whole grains, such as whole wheat or whole grain breads, crackers, cereals, and pasta. Unsweetened oatmeal, bulgur, barley, quinoa, or brown rice. Corn or whole wheat flour tortillas. Vegetables  Fresh or frozen vegetables (raw, steamed, roasted, or grilled). Green salads. Fruits  All fresh, canned (in natural juice), or frozen fruits. Meats and other protein foods  Ground beef (85% or leaner), grass-fed beef, or beef trimmed of fat. Skinless chicken or [place]. Ground chicken or [place]. Pork trimmed of fat. All fish and seafood. Egg whites. Dried beans, peas, or lentils. Unsalted nuts or seeds.  Unsalted canned beans. Natural nut butters without added sugar and oil. Dairy  Low-fat or nonfat dairy products, such as skim or 1% milk, 2% or reduced-fat cheeses, low-fat and fat-free ricotta or cottage cheese, or plain low-fat and nonfat yogurt. Fats and oils  Tub margarine without trans fats. Light or reduced-fat mayonnaise and salad  dressings. Avocado. Olive, canola, sesame, or safflower oils. The items listed above may not be a complete list of foods and beverages you can eat. Contact a dietitian for more information. Foods to avoid Grains  White bread. White pasta. White rice. Cornbread. Bagels, pastries, and croissants. Crackers and snack foods that contain trans fat and hydrogenated oils. Vegetables  Vegetables cooked in cheese, cream, or butter sauce. Fried vegetables. Fruits  Canned fruit in heavy syrup. Fruit in cream or butter sauce. Fried fruit. Meats and other protein foods  Fatty cuts of meat. Ribs, chicken wings, bacon, sausage, bologna, salami, chitterlings, fatback, hot dogs, bratwurst, and packaged lunch meats. Liver and organ meats. Whole eggs and egg yolks. Chicken and [place] with skin. Fried meat. Dairy  Whole or 2% milk, cream, half-and-half, and cream cheese. Whole milk cheeses. Whole-fat or sweetened yogurt. Full-fat cheeses. Nondairy creamers and whipped toppings. Processed cheese, cheese spreads, and cheese curds. Beverages  Alcohol. Sugar-sweetened drinks such as sodas, lemonade, and fruit drinks. Fats and oils  Butter, stick margarine, lard, shortening, ghee, or bacon fat. Coconut, palm kernel, and palm oils. Sweets and desserts  Corn syrup, sugars, honey, and molasses. Candy. Jam and jelly. Syrup. Sweetened cereals. Cookies, pies, cakes, donuts, muffins, and ice cream. The items listed above may not be a complete list of foods and beverages you should avoid. Contact a dietitian for more information. Summary  Your body needs fat and cholesterol for basic functions. However, eating too much of these things can be harmful to your health.  Work with your health care provider and dietitian to follow a diet low in fat and cholesterol. Doing this may help lower your risk for heart disease and other conditions.  Choose healthy fats, such as monounsaturated and polyunsaturated fats, and foods  high in omega-3 fatty acids.  Eat fiber-rich foods, such as whole grains, beans, peas, fruits, and vegetables.  Limit or avoid alcohol, fried foods, and foods high in saturated fats, partially hydrogenated oils, and sugar. This information is not intended to replace advice given to you by your health care provider. Make sure you discuss any questions you have with your health care provider. Document Revised: 11/08/2019 Document Reviewed: 07/12/2019 Elsevier Patient Education  2021 [employer].

## 2020-04-18 NOTE — Progress Notes (Signed)
Location:  [facility] clinic Provider:  Faaris Arizpe L. Renato Gails, D.O., C.M.D.  Code status:  DNR  Goals of Care:  Advanced Directives 04/18/2020  Does Patient Have a Medical Advance Directive? No  Type of Advance Directive -  Copy of Healthcare Power of Attorney in Chart? -  Would patient like information on creating a medical advance directive? No - Patient declined   Chief Complaint  Patient presents with  . Medical Management of Chronic Issues    4 month follow-up and review labs (copy printed and given to patient).Discuss ACP    HPI: Patient is a 86 y.o. female seen today for medical management of chronic diseases.    Again discussed advance directives.  Has not seen son who is local face to face in a few years.  Her partner of many years is not medical at all.  Her youngest son is busy running merrymaids in [employer].  He would be the best and most responsible.  Her daughter in [place] is not someone to pick for this.  She has a terrible fear about not being able to breathe.  She does not want CPR or defibrillation.  She would want to be kept comfortable if her heart stopped.    Cholesterol:  She filled the crestor but didn't take right away due to getting booster.  Wanted to know if she was having a problem from the med or vaccine.    She keeps a little bit of headache much of the time.  Has a lot of body aches mostly in her knees and had a bad back for years.  She did modify her diet after I had told her her cholesterol was high.  She looked at that and did make some changes about 6 wks ago.  Knows to watch fatty things, dairy.  Is eating more oatmeal and cheerios.  Wanted to know what her numbers were like with the changes.  She is going to bravely start crestor.  she already has some muscle pains.  She can't stand but 20 mins before she has to quit cutting up items to cook.  Dietary info provided.   She's gotten to where she has to take 2 tylenol each night to sleep better and not wake  up with extreme pain.  She's also put some local gel on her knees.  Sometimes uses salonpas patches.    She went to urology and has the one year f/u.    Says she writes things down and loses her notes.    Dry mouth--discussed biotene products.   Has several keratoses that she might want removed eventually.  IBS-D:  Always diarrhea. She had two large diarrhea spells while here.   Discussed trying benefiber for bulk.  Apples help and she's eating oatmeal which seems to help.  Eyes are getting bad and has not been in more than 2 yrs.  She's avoiding going anywhere. She has GOT to go.  Says she's getting a film almost over her eyes.  She still needs her right cataract surgery.    Past Medical History:  Diagnosis Date  . Allergy    environmental  . Anemia   . Arthritis   . Asthma   . Chronic diarrhea 1986   since cholecystecomy  . Esophageal reflux   . H/O orthostatic hypotension   . 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 Migraine   . Urge incontinence of urine    followed by Urology - Dr. Wilburn Mylar    Past Surgical History:  Procedure Laterality Date  . ABLATION  2005   endovenous  . APPENDECTOMY    . AUGMENTATION MAMMAPLASTY Bilateral 1971   [facility] Dr. Tyrell Antonio  . CHOLECYSTECTOMY  1986   chronic diarrhea since  . COSMETIC SURGERY    . LITHOTRIPSY Bilateral 2006  . PERCUTANEOUS NEPHROSTOLITHOTOMY Left 06/30/2011   w/ cystoscopy by Dr. Karena Addison at [facility] (and poss ureteral stent placement?)  . PERCUTANEOUS NEPHROSTOLITHOTOMY Left 07/07/2011   w/ ureteroscopy and stone manipulation Dr. Karena Addison at [facility]. Surg >4 hrs with gen  anesthesia and pt noticed new permanent cognitive deficits after  . TUBAL LIGATION Bilateral 1968  . URETEROLITHOTOMY Right 12/17/2011   Dr. Karena Addison at [facility]  . URETEROSCOPY WITH HOLMIUM LASER LITHOTRIPSY Left 07/07/2011   by percutaneous nephrostolithotomy with basket extraction, ureteral stent removal (placed 06/30/11?), and placement of Lt percutaneous nephrostomy tube by Dr. Karena Addison at [facility] for numerous >2cm staghorn calcium oxalate stones in left renal pelvis retained after initial perc nephrostolithotomy wiht stent placement 1 wk prior     Allergies  Allergen Reactions  . Azithromycin Other (See Comments)    thrush  . Latex Hives and Other (See Comments)    Lip swelling and redness Lip swelling and redness  . Other Rash  . Codeine   . Doxycycline   . Gluten Meal Diarrhea  . Tape Rash    Outpatient Encounter Medications as of 04/18/2020  Medication Sig  . cephALEXin (KEFLEX) 500 MG capsule TAKE 1 CAPSULE BY MOUTH EVERY DAY  . Liniments (SALONPAS ARTHRITIS PAIN RELIEF EX) Apply topically.  . nadolol (CORGARD) 40 MG tablet TAKE 1 TABLET(40 MG) BY MOUTH DAILY  . rosuvastatin (CRESTOR) 5 MG tablet Take 1 tablet (5 mg total) by mouth daily.  . Vitamin D, Ergocalciferol, (DRISDOL) 1.25 MG (50000 UNIT) CAPS capsule Take 1 capsule (50,000 Units total) by mouth every 7 (seven) days.   No facility-administered encounter medications on file as of 04/18/2020.    Review of Systems:  Review of Systems  Constitutional: Negative for chills, fever and malaise/fatigue.  HENT: Negative for congestion and sore throat.   Eyes: Negative for blurred vision.  Respiratory: Negative for cough and shortness of breath.   Cardiovascular: Positive for leg swelling. Negative for chest pain and palpitations.  Gastrointestinal: Negative for abdominal pain, blood in stool, constipation and melena.  Genitourinary: Negative for dysuria.  Musculoskeletal: Positive for back pain and joint  pain. Negative for falls.  Neurological: Negative for dizziness and loss of consciousness.  Endo/Heme/Allergies: Bruises/bleeds easily.  Psychiatric/Behavioral: Negative for depression and memory loss. The patient is not nervous/anxious and does not have insomnia.     Health Maintenance  Topic Date Due  . TETANUS/TDAP  02/24/2023  . INFLUENZA VACCINE  Completed  . DEXA SCAN  Completed  . COVID-19 Vaccine  Completed  . PNA vac Low Risk Adult  Completed    Physical Exam: Vitals:   04/18/20 1542  Height: 5\' 2"  (1.575 m)   Body mass index is 36.51 kg/m. Physical Exam Vitals reviewed.  Constitutional:      Appearance: Normal appearance. She is obese.  Eyes:     Conjunctiva/sclera:  Conjunctivae normal.     Pupils: Pupils are equal, round, and reactive to light.  Cardiovascular:     Rate and Rhythm: Normal rate and regular rhythm.     Pulses: Normal pulses.     Heart sounds: Normal heart sounds.  Pulmonary:     Effort: Pulmonary effort is normal.     Breath sounds: Normal breath sounds. No wheezing, rhonchi or rales.  Abdominal:     General: Bowel sounds are normal. There is no distension.     Palpations: There is no mass.     Tenderness: There is no abdominal tenderness. There is no guarding or rebound.  Musculoskeletal:        General: Normal range of motion.     Right lower leg: Edema present.     Left lower leg: Edema present.     Comments: Uses rollator walker with seat  Skin:    Comments: Dark purple discoloration of swollen feet and legs (has been evaluated by vascular)  Neurological:     General: No focal deficit present.     Mental Status: She is alert and oriented to person, place, and time.  Psychiatric:        Mood and Affect: Mood normal.        Behavior: Behavior normal.     Labs reviewed: Basic Metabolic Panel: Recent Labs    08/17/19 1425 04/15/20 1204  NA 140 142  K 4.2 4.1  CL 106 107  CO2 27 27  GLUCOSE 100 110*  BUN 17 12  CREATININE  0.75 0.75  CALCIUM 9.2 8.9  TSH 1.41  --    Liver Function Tests: Recent Labs    08/17/19 1425  AST 19  ALT 14  BILITOT 0.5  PROT 6.2   No results for input(s): LIPASE, AMYLASE in the last 8760 hours. No results for input(s): AMMONIA in the last 8760 hours. CBC: Recent Labs    08/17/19 1425 04/15/20 1204  WBC 7.6 5.0  NEUTROABS 4,659 2,625  HGB 13.4 14.2  HCT 42.1 44.7  MCV 88.3 87.5  PLT 189 214   Lipid Panel: Recent Labs    08/17/19 1425 04/15/20 1204  CHOL 241* 221*  HDL 46* 46*  LDLCALC 155* 824*  TRIG 237* 235*  CHOLHDL 5.2* 4.8   Lab Results  Component Value Date   HGBA1C 5.3 08/17/2019    Procedures since last visit: No results found.  Assessment/Plan 1. ACP (advance care planning) -20 mins spent -discussed DNR again today which she agrees to -still needs to complete living will and HCPOA--explained forms and discussed importance again  2. Dry mouth -discussed options of different biotene products, regular hydration  3. Hyperlipidemia, mixed -willing to try taking crestor that I ordered after her lab results returned, has made dietary changes with some improvement but doubt possible to reach goal when she cannot also exercise without meds - Lipid panel; Future  4. NEPHROLITHIASIS, HX OF -follows with urology re: this and renal mass that's been stable for a few years now  5. Lower leg edema -longstanding and with discoloration, but ABIs ok, cannot put on and take off compression hose -wears sandals due to inability to find shoewear she can get into  6. Hyperglycemia -avoid high carb foods, f/u lab - Hemoglobin A1c; Future  7. Irritable bowel syndrome with diarrhea -encouraged regular fiber, hydration  Labs/tests ordered:   Lab Orders     Hemoglobin A1c     Lipid panel  Next appt:  08/22/2020  Antonina Deziel L. Oralee Rapaport, D.O. Geriatrics [facility] [address], [place] [address] Cell Phone (Mon-Fri  8am-5pm):  [phone] On Call:  [phone] & follow prompts after 5pm & weekends Office Phone:  [phone] Office Fax:  [phone]

## 2020-05-13 ENCOUNTER — Encounter: Payer: Self-pay | Admitting: Internal Medicine

## 2020-06-04 ENCOUNTER — Other Ambulatory Visit: Payer: Self-pay

## 2020-06-04 ENCOUNTER — Telehealth: Payer: Self-pay

## 2020-06-04 ENCOUNTER — Ambulatory Visit (INDEPENDENT_AMBULATORY_CARE_PROVIDER_SITE_OTHER): Payer: Medicare Other | Admitting: Nurse Practitioner

## 2020-06-04 ENCOUNTER — Encounter: Payer: Self-pay | Admitting: Nurse Practitioner

## 2020-06-04 DIAGNOSIS — E2839 Other primary ovarian failure: Secondary | ICD-10-CM | POA: Diagnosis not present

## 2020-06-04 DIAGNOSIS — Z0000 Encounter for general adult medical examination without abnormal findings: Secondary | ICD-10-CM

## 2020-06-04 NOTE — Patient Instructions (Addendum)
Susan Boone , Thank you for taking time to come for your Medicare Wellness Visit. I appreciate your ongoing commitment to your health goals. Please review the following plan we discussed and let me know if I can assist you in the future.   Screening recommendations/referrals: Colonoscopy aged out Mammogram aged out Bone Density order placed today, can call [phone] to schedule Recommended yearly ophthalmology/optometry visit for glaucoma screening and checkup Recommended yearly dental visit for hygiene and checkup  Vaccinations: Influenza vaccine up to date Pneumococcal vaccine up to date Tdap vaccine up to date Shingles vaccine RECOMMENDED - to get at local pharmacy    Advanced directives: recommend to complete and bring back to office to place on file.   Conditions/risks identified: obesity, debility, advance age, sedentary lifestyle  Next appointment: 1 year for AWV   Preventive Care 84 Years and Older, Female Preventive care refers to lifestyle choices and visits with your health care provider that can promote health and wellness. What does preventive care include?  A yearly physical exam. This is also called an annual well check.  Dental exams once or twice a year.  Routine eye exams. Ask your health care provider how often you should have your eyes checked.  Personal lifestyle choices, including:  Daily care of your teeth and gums.  Regular physical activity.  Eating a healthy diet.  Avoiding tobacco and drug use.  Limiting alcohol use.  Practicing safe sex.  Taking low-dose aspirin every day.  Taking vitamin and mineral supplements as recommended by your health care provider. What happens during an annual well check? The services and screenings done by your health care provider during your annual well check will depend on your age, overall health, lifestyle risk factors, and family history of disease. Counseling  Your health care provider may ask you  questions about your:  Alcohol use.  Tobacco use.  Drug use.  Emotional well-being.  Home and relationship well-being.  Sexual activity.  Eating habits.  History of falls.  Memory and ability to understand (cognition).  Work and work Astronomer.  Reproductive health. Screening  You may have the following tests or measurements:  Height, weight, and BMI.  Blood pressure.  Lipid and cholesterol levels. These may be checked every 5 years, or more frequently if you are over 84 years old.  Skin check.  Lung cancer screening. You may have this screening every year starting at age 84 if you have a 30-pack-year history of smoking and currently smoke or have quit within the past 15 years.  Fecal occult blood test (FOBT) of the stool. You may have this test every year starting at age 84.  Flexible sigmoidoscopy or colonoscopy. You may have a sigmoidoscopy every 5 years or a colonoscopy every 10 years starting at age 84.  Hepatitis C blood test.  Hepatitis B blood test.  Sexually transmitted disease (STD) testing.  Diabetes screening. This is done by checking your blood sugar (glucose) after you have not eaten for a while (fasting). You may have this done every 1-3 years.  Bone density scan. This is done to screen for osteoporosis. You may have this done starting at age 84.  Mammogram. This may be done every 1-2 years. Talk to your health care provider about how often you should have regular mammograms. Talk with your health care provider about your test results, treatment options, and if necessary, the need for more tests. Vaccines  Your health care provider may recommend certain vaccines, such as:  Influenza  vaccine. This is recommended every year.  Tetanus, diphtheria, and acellular pertussis (Tdap, Td) vaccine. You may need a Td booster every 10 years.  Zoster vaccine. You may need this after age 64.  Pneumococcal 13-valent conjugate (PCV13) vaccine. One dose is  recommended after age 80.  Pneumococcal polysaccharide (PPSV23) vaccine. One dose is recommended after age 63. Talk to your health care provider about which screenings and vaccines you need and how often you need them. This information is not intended to replace advice given to you by your health care provider. Make sure you discuss any questions you have with your health care provider. Document Released: 04/05/2015 Document Revised: 11/27/2015 Document Reviewed: 01/08/2015 Elsevier Interactive Patient Education  2017 [employer] Prevention in the Home Falls can cause injuries. They can happen to people of all ages. There are many things you can do to make your home safe and to help prevent falls. What can I do on the outside of my home?  Regularly fix the edges of walkways and driveways and fix any cracks.  Remove anything that might make you trip as you walk through a door, such as a raised step or threshold.  Trim any bushes or trees on the path to your home.  Use bright outdoor lighting.  Clear any walking paths of anything that might make someone trip, such as rocks or tools.  Regularly check to see if handrails are loose or broken. Make sure that both sides of any steps have handrails.  Any raised decks and porches should have guardrails on the edges.  Have any leaves, snow, or ice cleared regularly.  Use sand or salt on walking paths during winter.  Clean up any spills in your garage right away. This includes oil or grease spills. What can I do in the bathroom?  Use night lights.  Install grab bars by the toilet and in the tub and shower. Do not use towel bars as grab bars.  Use non-skid mats or decals in the tub or shower.  If you need to sit down in the shower, use a plastic, non-slip stool.  Keep the floor dry. Clean up any water that spills on the floor as soon as it happens.  Remove soap buildup in the tub or shower regularly.  Attach bath mats  securely with double-sided non-slip rug tape.  Do not have throw rugs and other things on the floor that can make you trip. What can I do in the bedroom?  Use night lights.  Make sure that you have a light by your bed that is easy to reach.  Do not use any sheets or blankets that are too big for your bed. They should not hang down onto the floor.  Have a firm chair that has side arms. You can use this for support while you get dressed.  Do not have throw rugs and other things on the floor that can make you trip. What can I do in the kitchen?  Clean up any spills right away.  Avoid walking on wet floors.  Keep items that you use a lot in easy-to-reach places.  If you need to reach something above you, use a strong step stool that has a grab bar.  Keep electrical cords out of the way.  Do not use floor polish or wax that makes floors slippery. If you must use wax, use non-skid floor wax.  Do not have throw rugs and other things on the floor that can  make you trip. What can I do with my stairs?  Do not leave any items on the stairs.  Make sure that there are handrails on both sides of the stairs and use them. Fix handrails that are broken or loose. Make sure that handrails are as long as the stairways.  Check any carpeting to make sure that it is firmly attached to the stairs. Fix any carpet that is loose or worn.  Avoid having throw rugs at the top or bottom of the stairs. If you do have throw rugs, attach them to the floor with carpet tape.  Make sure that you have a light switch at the top of the stairs and the bottom of the stairs. If you do not have them, ask someone to add them for you. What else can I do to help prevent falls?  Wear shoes that:  Do not have high heels.  Have rubber bottoms.  Are comfortable and fit you well.  Are closed at the toe. Do not wear sandals.  If you use a stepladder:  Make sure that it is fully opened. Do not climb a closed  stepladder.  Make sure that both sides of the stepladder are locked into place.  Ask someone to hold it for you, if possible.  Clearly mark and make sure that you can see:  Any grab bars or handrails.  First and last steps.  Where the edge of each step is.  Use tools that help you move around (mobility aids) if they are needed. These include:  Canes.  Walkers.  Scooters.  Crutches.  Turn on the lights when you go into a dark area. Replace any light bulbs as soon as they burn out.  Set up your furniture so you have a clear path. Avoid moving your furniture around.  If any of your floors are uneven, fix them.  If there are any pets around you, be aware of where they are.  Review your medicines with your doctor. Some medicines can make you feel dizzy. This can increase your chance of falling. Ask your doctor what other things that you can do to help prevent falls. This information is not intended to replace advice given to you by your health care provider. Make sure you discuss any questions you have with your health care provider. Document Released: 01/03/2009 Document Revised: 08/15/2015 Document Reviewed: 04/13/2014 Elsevier Interactive Patient Education  2017 [employer].

## 2020-06-04 NOTE — Telephone Encounter (Signed)
[name], espy are scheduled for a virtual visit with your provider today.    Just as we do with appointments in the office, we must obtain your consent to participate.  Your consent will be active for this visit and any virtual visit you may have with one of our providers in the next 365 days.    If you have a MyChart account, I can also send a copy of this consent to you electronically.  All virtual visits are billed to your insurance company just like a traditional visit in the office.  As this is a virtual visit, video technology does not allow for your provider to perform a traditional examination.  This may limit your provider's ability to fully assess your condition.  If your provider identifies any concerns that need to be evaluated in person or the need to arrange testing such as labs, EKG, etc, we will make arrangements to do so.    Although advances in technology are sophisticated, we cannot ensure that it will always work on either your end or our end.  If the connection with a video visit is poor, we may have to switch to a telephone visit.  With either a video or telephone visit, we are not always able to ensure that we have a secure connection.   I need to obtain your verbal consent now.   Are you willing to proceed with your visit today?   Susan Boone has provided verbal consent on 06/04/2020 for a virtual visit (video or telephone).   [name], [facility] 06/04/2020  3:51 PM

## 2020-06-04 NOTE — Progress Notes (Signed)
Subjective:   Susan Boone is a 86 y.o. female who presents for Medicare Annual (Subsequent) preventive examination.  Review of Systems     Cardiac Risk Factors include: sedentary lifestyle;advanced age (>79men, >81 women);obesity (BMI >30kg/m2);dyslipidemia;family history of premature cardiovascular disease     Objective:    Today's Vitals   06/04/20 1520  PainSc: 9    There is no height or weight on file to calculate BMI.  Advanced Directives 06/04/2020 04/18/2020 07/13/2019 12/24/2016 12/16/2016  Does Patient Have a Medical Advance Directive? No No No Yes Yes  Type of Advance Directive - - Web designer;Living will Healthcare Power of [place];Living will  Copy of Healthcare Power of Attorney in Chart? - - - No - copy requested No - copy requested  Would patient like information on creating a medical advance directive? No - Patient declined No - Patient declined No - Patient declined;Yes (ED - Information included in AVS) - -    Current Medications (verified) Outpatient Encounter Medications as of 06/04/2020  Medication Sig   cephALEXin (KEFLEX) 500 MG capsule TAKE 1 CAPSULE BY MOUTH EVERY DAY   Liniments (SALONPAS ARTHRITIS PAIN RELIEF EX) Apply topically.   nadolol (CORGARD) 40 MG tablet TAKE 1 TABLET(40 MG) BY MOUTH DAILY   nystatin-triamcinolone ointment (MYCOLOG) Apply 1 application topically 2 (two) times daily.   rosuvastatin (CRESTOR) 5 MG tablet Take 1 tablet (5 mg total) by mouth daily.   [DISCONTINUED] Vitamin D, Ergocalciferol, (DRISDOL) 1.25 MG (50000 UNIT) CAPS capsule Take 1 capsule (50,000 Units total) by mouth every 7 (seven) days.   No facility-administered encounter medications on file as of 06/04/2020.    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 Wilburn Mylar   Past Surgical History:  Procedure Laterality Date   ABLATION  2005   endovenous   APPENDECTOMY     AUGMENTATION MAMMAPLASTY Bilateral 1971   [facility] Dr. Tyrell Antonio   CHOLECYSTECTOMY  1986   chronic diarrhea since   COSMETIC SURGERY     LITHOTRIPSY Bilateral 2006   PERCUTANEOUS NEPHROSTOLITHOTOMY Left 06/30/2011   w/ cystoscopy by Dr. Karena Addison at [facility] (and poss ureteral stent placement?)   PERCUTANEOUS NEPHROSTOLITHOTOMY Left 07/07/2011   w/ ureteroscopy and stone manipulation Dr. Karena Addison at [facility]. Surg >4 hrs with gen anesthesia and pt noticed new permanent cognitive deficits after   TUBAL LIGATION Bilateral 1968   URETEROLITHOTOMY Right 12/17/2011   Dr. Karena Addison at [facility]   URETEROSCOPY WITH HOLMIUM LASER LITHOTRIPSY Left 07/07/2011   by percutaneous nephrostolithotomy with basket extraction, ureteral stent removal (placed 06/30/11?), and placement of Lt percutaneous nephrostomy tube by Dr. Karena Addison at [facility] for numerous >2cm staghorn calcium oxalate stones in left renal pelvis retained after  initial perc nephrostolithotomy wiht stent placement 1 wk prior    Family History  Problem Relation Age of Onset   Stroke Father    Hyperlipidemia Father    Lung cancer Mother    Cancer Maternal Grandmother    Social History   Socioeconomic History   Marital status: Divorced    Spouse name: Not on  file   Number of children: Not on file   Years of education: Not on file   Highest education level: Not on file  Occupational History   Occupation: retired  Tobacco Use   Smoking status: Never Smoker   Smokeless tobacco: Never Used  Building services engineerVaping Use   Vaping Use: Never used  Substance and Sexual Activity   Alcohol use: No    Alcohol/week: 0.0 standard drinks   Drug use: No   Sexual activity: Not on file  Other Topics Concern   Not on file  Social History Narrative   Previously had occasional 1-2 cigarettes but none since 1981   Rarely has caffeine and not coffee   With her partner since 1978, Alden Serverrnest   They live in a 1 story home without pets [place] this [place]   She's retired from Runner, broadcasting/film/videonursing, 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   She has challenges preparing meals and bathing and dressing due to mobility   Social Determinants of Health   Financial Resource Strain: Not on file  Food Insecurity: Not on file  Transportation Needs: Not on file  Physical Activity: Not on file  Stress: Not on file  Social Connections: Not on file    Tobacco Counseling Counseling given: Not Answered   Clinical Intake:  Pre-visit preparation completed: Yes  Pain : 0-10 Pain Score: 9  Pain Type: Chronic pain Pain Location: Knee Pain Orientation: Left,Right Pain Radiating Towards: right leg Pain Descriptors / Indicators: Radiating,Aching,Constant Pain Onset: More than a month ago Pain Frequency: Constant Effect of Pain on Daily Activities: unable to walk, limited mobility     BMI - recorded: 36.58 Nutritional Status: BMI > 30  Obese Nutritional Risks: None Diabetes: No  How often do you need to have someone help you when you read instructions, pamphlets, or other written materials from your doctor or pharmacy?: 4 - Often  Diabetic?no         Activities of Daily Living In your present state of health, do you have any  difficulty performing the following activities: 06/04/2020  Hearing? N  Vision? Y  Difficulty concentrating or making decisions? Y  Walking or climbing stairs? Y  Dressing or bathing? Y  Doing errands, shopping? Y  Preparing Food and eating ? Y  Using the Toilet? N  In the past six months, have you accidently leaked urine? Y  Do you have problems with loss of bowel control? N  Managing your Medications? Y  Managing your Finances? N  Housekeeping or managing your Housekeeping? Y  Some recent data might be hidden    Patient Care Team: Kermit Baloeed, Tiffany L, DO as PCP - General (Geriatric Medicine) Campbell StallGruber, Hope, MD as Attending Physician (Dermatology) Glenna Fellowshimmappa, Brinda, MD as Consulting Physician (Plastic Surgery) Wilburn MylarAlbertson, Elizabeth, MD as Referring Physician (Urology) Early, Kristen Loaderodd F, MD as Consulting Physician (Vascular Surgery) Ollen GrossAluisio, Frank, MD as Consulting Physician (Orthopedic Surgery) Mateo FlowHecker, Kathryn, MD as Consulting Physician (Ophthalmology)  Indicate any recent Medical Services you may have received from other than [facility] providers in the past year (date may be approximate).  Assessment:   This is a routine wellness examination for [facility].  Hearing/Vision screen  Hearing Screening   125Hz  250Hz  500Hz  1000Hz  2000Hz  3000Hz  4000Hz  6000Hz  8000Hz   Right ear:           Left ear:           Comments: Patient has problems with hearing at times.  Vision Screening Comments: Patient has vision problems. Patient goes to vision  Dietary issues and exercise activities discussed: Current Exercise Habits: The patient does not participate in regular exercise at present, Exercise limited by: orthopedic condition(s)  Goals     Exercise (walking)     Patient wants to try to decrease her pain so she can start walking more daily to increase her exercise.       Depression Screen PHQ 2/9 Scores 06/04/2020 04/18/2020 12/18/2019 04/08/2017 03/11/2017 12/24/2016 10/02/2016  PHQ - 2  Score 0 0 0 0 0 0 0    Fall Risk Fall Risk  06/04/2020 04/18/2020 12/18/2019 07/13/2019 04/08/2017  Falls in the past year? 0 1 0 0 No  Number falls in past yr: 0 0 0 0 -  Injury with Fall? 0 0 0 0 -    FALL RISK PREVENTION PERTAINING TO THE HOME:  Any stairs in or around the home? Yes  If so, are there any without handrails? Yes  Home free of loose throw rugs in walkways, pet beds, electrical cords, etc? No  Adequate lighting in your home to reduce risk of falls? Yes   ASSISTIVE DEVICES UTILIZED TO PREVENT FALLS:  Life alert? No  Use of a cane, walker or w/c? Yes  Grab bars in the bathroom? Yes  Shower chair or bench in shower? Yes  Elevated toilet seat or a handicapped toilet? Yes   TIMED UP AND GO:  Was the test performed? No .   Cognitive Function:     6CIT Screen 06/04/2020 12/24/2016  What Year? 0 points 0 points  What month? 0 points 0 points  What time? 0 points 0 points  Count back from 20 0 points 0 points  Months in reverse 0 points 0 points  Repeat phrase 2 points 0 points  Total Score 2 0    Immunizations Immunization History  Administered Date(s) Administered   Fluad Quad(high Dose 65+) 12/18/2019   Influenza, High Dose Seasonal PF 12/23/2016, 12/30/2017   Influenza-Unspecified 12/21/2013, 12/21/2016, 11/22/2018   PFIZER(Purple Top)SARS-COV-2 Vaccination 04/07/2019, 04/28/2019, 01/01/2020   Pneumococcal Conjugate-13 02/06/2015   Pneumococcal Polysaccharide-23 02/11/2002   Tdap 02/23/2013   Zoster 03/23/2014    TDAP status: Up to date  Flu Vaccine status: Up to date  Pneumococcal vaccine status: Up to date  Covid-19 vaccine status: Completed vaccines  Qualifies for Shingles Vaccine? Yes   Zostavax completed Yes   Shingrix Completed?: No.    Education has been provided regarding the importance of this vaccine. Patient has been advised to call insurance company to determine out of pocket expense if they have not yet received this vaccine.  Advised may also receive vaccine at local pharmacy or Health Dept. Verbalized acceptance and understanding.  Screening Tests Health Maintenance  Topic Date Due   TETANUS/TDAP  02/24/2023   INFLUENZA VACCINE  Completed   DEXA SCAN  Completed   COVID-19 Vaccine  Completed   PNA vac Low Risk Adult  Completed   HPV VACCINES  Aged Out    Health Maintenance  There are no preventive care reminders to display for this patient.  Colorectal  cancer screening: No longer required.   Mammogram status: No longer required due to age.  Bone Density status: Ordered today. Pt provided with contact info and advised to call to schedule appt.  Lung Cancer Screening: (Low Dose CT Chest recommended if Age 48-80 years, 30 pack-year currently smoking OR have quit w/in 15years.) does not qualify.     Additional Screening:  Hepatitis C Screening: does not qualify;   Vision Screening: Recommended annual ophthalmology exams for early detection of glaucoma and other disorders of the eye. Is the patient up to date with their annual eye exam?  Yes  Who is the provider or what is the name of the office in which the patient attends annual eye exams? [facility]  If pt is not established with a provider, would they like to be referred to a provider to establish care? No .   Dental Screening: Recommended annual dental exams for proper oral hygiene  Community Resource Referral / Chronic Care Management: CRR required this visit?  No   CCM required this visit?  No      Plan:     I have personally reviewed and noted the following in the patients chart:    Medical and social history  Use of alcohol, tobacco or illicit drugs   Current medications and supplements  Functional ability and status  Nutritional status  Physical activity  Advanced directives  List of other physicians  Hospitalizations, surgeries, and ER visits in previous 12 months  Vitals  Screenings to include  cognitive, depression, and falls  Referrals and appointments  In addition, I have reviewed and discussed with patient certain preventive protocols, quality metrics, and best practice recommendations. A written personalized care plan for preventive services as well as general preventive health recommendations were provided to patient.     Sharon Seller, NP   06/04/2020    Virtual Visit via Telephone Note  I connected with@ on 06/04/20 at  3:45 PM EDT by telephone and verified that I am speaking with the correct person using two identifiers.  Location: Patient: home Provider: [place]   I discussed the limitations, risks, security and privacy concerns of performing an evaluation and management service by telephone and the availability of in person appointments. I also discussed with the patient that there may be a patient responsible charge related to this service. The patient expressed understanding and agreed to proceed.   I discussed the assessment and treatment plan with the patient. The patient was provided an opportunity to ask questions and all were answered. The patient agreed with the plan and demonstrated an understanding of the instructions.   The patient was advised to call back or seek an in-person evaluation if the symptoms worsen or if the condition fails to improve as anticipated.  I provided 18 minutes of non-face-to-face time during this encounter.  Janene Harvey. [employer] printed and mailed

## 2020-06-04 NOTE — Progress Notes (Signed)
This service is provided via telemedicine  No vital signs collected/recorded due to the encounter was a telemedicine visit.   Location of patient (ex: home, work):  Home  Patient consents to a telephone visit:  Yes, see encounter dated 06/04/2020  Location of the provider (ex: office, home):  [facility]  Name of any referring provider: Bufford Spikes, DO  Names of all persons participating in the telemedicine service and their role in the encounter: Abbey Chatters, Nurse Practitioner, [name], CMA, and patient.   Time spent on call:  21 minutes with medical assistant

## 2020-06-20 ENCOUNTER — Other Ambulatory Visit: Payer: Self-pay | Admitting: Nurse Practitioner

## 2020-06-20 DIAGNOSIS — R739 Hyperglycemia, unspecified: Secondary | ICD-10-CM

## 2020-06-20 DIAGNOSIS — E782 Mixed hyperlipidemia: Secondary | ICD-10-CM

## 2020-06-24 ENCOUNTER — Other Ambulatory Visit: Payer: Self-pay | Admitting: Internal Medicine

## 2020-06-24 DIAGNOSIS — R300 Dysuria: Secondary | ICD-10-CM

## 2020-06-24 NOTE — Telephone Encounter (Signed)
I wonder if this is a prescription through her urologist? It does not look like Dr Renato Gails has filled it in a year

## 2020-06-24 NOTE — Telephone Encounter (Signed)
Patient has request refill on "Keflex 500mg ". Patient last refill was 07/17/2019 with 90 capsules to be taken daily with 1 refill. Is this medication long term medication? Medication pend and sent to PCP 07/19/2019 Janyth Contes, NP for approval.

## 2020-06-25 NOTE — Telephone Encounter (Signed)
Okay thanks for the follow up, Rx sent to the pharmacy

## 2020-06-25 NOTE — Telephone Encounter (Signed)
It states that Casimer Bilis, MA filled medication 11 months ago.

## 2020-06-25 NOTE — Telephone Encounter (Signed)
Patient called back and stated that she takes Keflex once daily for years and Dr. Renato Gails has been refilling it. Stated that her original Urologist has left the practice that she saw for years and Dr. Renato Gails referred her to [facility] and she saw them once but stated Dr. Renato Gails refilled her Keflex for her.

## 2020-06-25 NOTE — Telephone Encounter (Signed)
Yes but only for a 6 month supply, can you call her and see if this is a prescription from her urologist.

## 2020-06-25 NOTE — Telephone Encounter (Signed)
Called patient and no answer. Voicemail was left with office call back number.   

## 2020-07-03 ENCOUNTER — Other Ambulatory Visit: Payer: Self-pay | Admitting: Internal Medicine

## 2020-07-03 DIAGNOSIS — Z1231 Encounter for screening mammogram for malignant neoplasm of breast: Secondary | ICD-10-CM

## 2020-07-03 DIAGNOSIS — T8543XS Leakage of breast prosthesis and implant, sequela: Secondary | ICD-10-CM

## 2020-07-11 ENCOUNTER — Other Ambulatory Visit: Payer: Medicare Other

## 2020-07-15 ENCOUNTER — Ambulatory Visit: Payer: Medicare Other | Admitting: Internal Medicine

## 2020-08-15 ENCOUNTER — Other Ambulatory Visit: Payer: Medicare Other

## 2020-08-20 ENCOUNTER — Other Ambulatory Visit: Payer: Medicare Other

## 2020-08-20 ENCOUNTER — Other Ambulatory Visit: Payer: Self-pay

## 2020-08-20 DIAGNOSIS — R739 Hyperglycemia, unspecified: Secondary | ICD-10-CM

## 2020-08-20 DIAGNOSIS — E782 Mixed hyperlipidemia: Secondary | ICD-10-CM

## 2020-08-21 ENCOUNTER — Other Ambulatory Visit: Payer: Self-pay

## 2020-08-21 ENCOUNTER — Ambulatory Visit (INDEPENDENT_AMBULATORY_CARE_PROVIDER_SITE_OTHER): Payer: Medicare Other | Admitting: Nurse Practitioner

## 2020-08-21 ENCOUNTER — Encounter: Payer: Self-pay | Admitting: Nurse Practitioner

## 2020-08-21 VITALS — BP 140/90 | HR 54 | Temp 98.2°F | Ht 62.0 in | Wt 196.6 lb

## 2020-08-21 DIAGNOSIS — N390 Urinary tract infection, site not specified: Secondary | ICD-10-CM | POA: Diagnosis not present

## 2020-08-21 DIAGNOSIS — M159 Polyosteoarthritis, unspecified: Secondary | ICD-10-CM

## 2020-08-21 DIAGNOSIS — N3941 Urge incontinence: Secondary | ICD-10-CM

## 2020-08-21 DIAGNOSIS — Z87442 Personal history of urinary calculi: Secondary | ICD-10-CM

## 2020-08-21 DIAGNOSIS — R600 Localized edema: Secondary | ICD-10-CM

## 2020-08-21 DIAGNOSIS — R739 Hyperglycemia, unspecified: Secondary | ICD-10-CM

## 2020-08-21 DIAGNOSIS — M8949 Other hypertrophic osteoarthropathy, multiple sites: Secondary | ICD-10-CM | POA: Diagnosis not present

## 2020-08-21 DIAGNOSIS — E782 Mixed hyperlipidemia: Secondary | ICD-10-CM | POA: Diagnosis not present

## 2020-08-21 DIAGNOSIS — K580 Irritable bowel syndrome with diarrhea: Secondary | ICD-10-CM

## 2020-08-21 LAB — LIPID PANEL
Cholesterol: 118 mg/dL (ref <200)
HDL: 52 mg/dL (ref >=50)
LDL Cholesterol (Calc): 46 mg/dL (calc)
Non-HDL Cholesterol (Calc): 66 mg/dL (calc) (ref <130)
Total CHOL/HDL Ratio: 2.3 (calc) (ref <5.0)
Triglycerides: 112 mg/dL (ref <150)

## 2020-08-21 LAB — COMPLETE METABOLIC PANEL WITH GFR
AG Ratio: 1.7 (calc) (ref 1.0–2.5)
ALT: 10 U/L (ref 6–29)
AST: 18 U/L (ref 10–35)
Albumin: 3.8 g/dL (ref 3.6–5.1)
Alkaline phosphatase (APISO): 44 U/L (ref 37–153)
BUN: 13 mg/dL (ref 7–25)
CO2: 28 mmol/L (ref 20–32)
Calcium: 8.9 mg/dL (ref 8.6–10.4)
Chloride: 108 mmol/L (ref 98–110)
Creat: 0.76 mg/dL (ref 0.60–0.88)
GFR, Est African American: 84 mL/min/{1.73_m2} (ref >=60)
GFR, Est Non African American: 73 mL/min/{1.73_m2} (ref >=60)
Globulin: 2.2 g/dL (calc) (ref 1.9–3.7)
Glucose, Bld: 103 mg/dL — ABNORMAL HIGH (ref 65–99)
Potassium: 4.6 mmol/L (ref 3.5–5.3)
Sodium: 143 mmol/L (ref 135–146)
Total Bilirubin: 0.7 mg/dL (ref 0.2–1.2)
Total Protein: 6 g/dL — ABNORMAL LOW (ref 6.1–8.1)

## 2020-08-21 LAB — HEMOGLOBIN A1C
Hgb A1c MFr Bld: 5.5 % of total Hgb (ref <5.7)
Mean Plasma Glucose: 111 mg/dL
eAG (mmol/L): 6.2 mmol/L

## 2020-08-21 NOTE — Progress Notes (Signed)
Careteam: Patient Care Team: Sharon Seller, NP as PCP - General (Geriatric Medicine) Campbell Stall, MD as Attending Physician (Dermatology) Glenna Fellows, MD as Consulting Physician (Plastic Surgery) Wilburn Mylar, MD as Referring Physician (Urology) Early, Kristen Loader, MD as Consulting Physician (Vascular Surgery) Ollen Gross, MD as Consulting Physician (Orthopedic Surgery) Mateo Flow, MD as Consulting Physician (Ophthalmology)  PLACE OF SERVICE:  [facility]  Advanced Directive information Does Patient Have a Medical Advance Directive?: No, Would patient like information on creating a medical advance directive?: No - Patient declined  Allergies  Allergen Reactions  . Azithromycin Other (See Comments)    thrush  . Latex Hives and Other (See Comments)    Lip swelling and redness Lip swelling and redness  . Other Rash  . Codeine   . Doxycycline   . Gluten Meal Diarrhea  . Tape Rash    Chief Complaint  Patient presents with  . Medical Management of Chronic Issues    4 month follow up.Discuss need for zoster vaccine. Patient would like to discuss lab results. Patient wouls like to discuss rehab options for hip, if any.     HPI: Patient is a 86 y.o. female for routine follow up. Previously saw Dr Renato Gails.   Hyperlipidemia- LDL at goal on crestor  Renal mass and nephrolithiasis followed by urologist.   Hx of kidney stones- tries to stay well hydrated to prevent  In 2013 she had a totally blocked kidney on the left and passing a kidney stone on the right. She was sent to surgery immediately. Had to have 3 surgeries in 2013. Had to have a stent place and now she is incontinent of urine.   She has hx of UTI and on keflex- controlled.   Hyperglycemia- A1c at goal at 5.5  IBS-D- ongoing diarrhea. Feels like stress makes it worse. Worries about what causes it to be worse. Tried metamucil and it has worked- does not take it every day. Also trying to increase  her fiber in diet.   She uses a walker and cane at home. In wheelchair today  Can not stand up for long. If she is in the kitchen she has areas to hold onto.  Has decrease strength in legs and knees.  Reports her right hip "has no cartilage" she was seen by orthopedic who obtained xrays recommended injections but she did not get around to. She reports she has moderate to severe arthritis in her knees as well.  Reports pain in her hip with movement.  Hard to get up and down. Every step she takes hurts Unable to drive because she can not left her leg. Has to lift her legs up by her pants.   LE have been blue/purple for years. ABis were okay. She reports today they look better than usual.   Lives with life person. Have been together 40+ years  Review of Systems:  Review of Systems  Constitutional: Negative for chills, fever and weight loss.  HENT: Negative for hearing loss and tinnitus.   Respiratory: Negative for cough, sputum production and shortness of breath.   Cardiovascular: Positive for leg swelling. Negative for chest pain and palpitations.  Gastrointestinal: Negative for abdominal pain, constipation, diarrhea and heartburn.  Genitourinary: Negative for dysuria, frequency and urgency.  Musculoskeletal: Positive for joint pain and myalgias. Negative for back pain and falls.  Skin: Negative.   Neurological: Negative for dizziness and headaches.  Psychiatric/Behavioral: Negative for depression and memory loss. The patient does not have  insomnia.     Past Medical History:  Diagnosis Date  . Allergy    environmental  . Anemia   . Arthritis   . Asthma   . Chronic diarrhea 1986   since cholecystecomy  . Esophageal reflux   . H/O orthostatic hypotension   . 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 Migraine   . Urge incontinence of urine    followed by Urology - Dr. Wilburn Mylar   Past Surgical History:  Procedure Laterality Date  . ABLATION  2005   endovenous  . APPENDECTOMY    . AUGMENTATION MAMMAPLASTY Bilateral 1971   [facility] Dr. Tyrell Antonio  . CHOLECYSTECTOMY  1986   chronic diarrhea since  . COSMETIC SURGERY    . LITHOTRIPSY Bilateral 2006  . PERCUTANEOUS NEPHROSTOLITHOTOMY Left 06/30/2011   w/ cystoscopy by [facility]. Karena Addison at [facility] (and poss ureteral stent placement?)  . PERCUTANEOUS NEPHROSTOLITHOTOMY Left 07/07/2011   w/ ureteroscopy and stone manipulation Dr. Karena Addison at [facility]. Surg >4 hrs with gen anesthesia and pt noticed new permanent cognitive deficits after  . TUBAL LIGATION Bilateral 1968  . URETEROLITHOTOMY Right 12/17/2011   Dr. Karena Addison at [facility]  . URETEROSCOPY WITH HOLMIUM LASER LITHOTRIPSY Left 07/07/2011   by percutaneous nephrostolithotomy with basket extraction, ureteral stent removal (placed 06/30/11?), and placement of Lt percutaneous nephrostomy tube by [facility]. Karena Addison at [facility] for numerous >2cm staghorn calcium oxalate stones in left renal pelvis retained after initial perc nephrostolithotomy wiht stent placement 1 wk prior    Social History:   reports that she has never smoked. She has never used smokeless tobacco. She reports that she does not drink alcohol and does not use drugs.  Family History  Problem Relation Age of Onset  . Stroke Father   . Hyperlipidemia Father   . Lung cancer Mother   . Cancer Maternal Grandmother     Medications: Patient's Medications  New Prescriptions   No medications on file  Previous Medications   CEPHALEXIN (KEFLEX) 500 MG CAPSULE    TAKE 1 CAPSULE BY MOUTH EVERY DAY   LINIMENTS (SALONPAS ARTHRITIS PAIN RELIEF EX)    Apply topically.    NADOLOL (CORGARD) 40 MG TABLET    TAKE 1 TABLET(40 MG) BY MOUTH DAILY   NYSTATIN-TRIAMCINOLONE OINTMENT (MYCOLOG)    Apply 1 application topically 2 (two) times daily.   ROSUVASTATIN (CRESTOR) 5 MG TABLET    Take 1 tablet (5 mg total) by mouth daily.  Modified Medications   No medications on file  Discontinued Medications   No medications on file    Physical Exam:  Vitals:   08/21/20 1507  BP: 140/90  Pulse: (!) 54  Temp: 98.2 F (36.8 C)  SpO2: 96%  Weight: 196 lb 9.6 oz (89.2 kg)  Height: 5\' 2"  (1.575 m)   Body mass index is 35.96 kg/m. Wt Readings from Last 3 Encounters:  08/21/20 196 lb 9.6 oz (89.2 kg)  04/18/20 200 lb (90.7 kg)  12/18/19 199 lb 9.6 oz (90.5 kg)    Physical Exam Constitutional:      General: She is not in acute distress.    Appearance: She is well-developed. She is not  diaphoretic.  HENT:     Head: Normocephalic and atraumatic.     Mouth/Throat:     Pharynx: No oropharyngeal exudate.  Eyes:     Conjunctiva/sclera: Conjunctivae normal.     Pupils: Pupils are equal, round, and reactive to light.  Cardiovascular:     Rate and Rhythm: Normal rate and regular rhythm.     Heart sounds: Normal heart sounds.  Pulmonary:     Effort: Pulmonary effort is normal.     Breath sounds: Normal breath sounds.  Abdominal:     General: Bowel sounds are normal.     Palpations: Abdomen is soft.  Musculoskeletal:        General: No tenderness.     Cervical back: Normal range of motion and neck supple.     Comments: In wheelchair  Skin:    General: Skin is warm and dry.     Comments: Bluish purple bilateral LE, feet and legs are warm to touch.   Neurological:     Mental Status: She is alert and oriented to person, place, and time.     Labs reviewed: Basic Metabolic Panel: Recent Labs    04/15/20 1204 08/20/20 1107  NA 142 143  K 4.1 4.6  CL 107 108  CO2 27 28  GLUCOSE 110* 103*  BUN 12 13  CREATININE 0.75 0.76  CALCIUM 8.9 8.9   Liver Function  Tests: Recent Labs    08/20/20 1107  AST 18  ALT 10  BILITOT 0.7  PROT 6.0*   No results for input(s): LIPASE, AMYLASE in the last 8760 hours. No results for input(s): AMMONIA in the last 8760 hours. CBC: Recent Labs    04/15/20 1204  WBC 5.0  NEUTROABS 2,625  HGB 14.2  HCT 44.7  MCV 87.5  PLT 214   Lipid Panel: Recent Labs    04/15/20 1204 08/20/20 1107  CHOL 221* 118  HDL 46* 52  LDLCALC 137* 46  TRIG 235* 112  CHOLHDL 4.8 2.3   TSH: No results for input(s): TSH in the last 8760 hours. A1C: Lab Results  Component Value Date   HGBA1C 5.5 08/20/2020     Assessment/Plan 1. Recurrent UTI Stable, no recent UTI, continues on keflex for prevention. Followed by urology.   2. Hyperlipidemia, mixed  LDL at goal on crestor. No adverse reaction noted.   3. Hyperglycemia -A1c at goal. Continue dietary modification, reviewed with pt.  4. Primary osteoarthritis involving multiple joints -ongoing pain to hip and knees limiting mobility.  - AMB referral to orthopedics for ongoing evaluation and treatment.  - Ambulatory referral to Home Health  5. Urge incontinence of urine Ongoing, continues to follow up with urology.  6. NEPHROLITHIASIS, HX OF Followed by urology.  7. Lower leg edema Ongoing, pt reports this has improved today.   8. Irritable bowel syndrome with diarrhea -encouraged to increase fiber.   Next appt: 4 month.  Janene Harvey. Biagio Borg  [facility] [phone]

## 2020-08-22 ENCOUNTER — Ambulatory Visit: Payer: Medicare Other | Admitting: Internal Medicine

## 2020-09-13 ENCOUNTER — Other Ambulatory Visit: Payer: Self-pay | Admitting: *Deleted

## 2020-09-13 MED ORDER — NADOLOL 40 MG PO TABS: ORAL_TABLET | ORAL | 1 refills | Status: DC

## 2020-09-13 NOTE — Telephone Encounter (Signed)
Pharmacy requested refill

## 2020-09-27 ENCOUNTER — Ambulatory Visit (INDEPENDENT_AMBULATORY_CARE_PROVIDER_SITE_OTHER): Payer: Medicare Other | Admitting: Nurse Practitioner

## 2020-09-27 ENCOUNTER — Other Ambulatory Visit: Payer: Self-pay

## 2020-09-27 ENCOUNTER — Encounter: Payer: Self-pay | Admitting: Nurse Practitioner

## 2020-09-27 VITALS — BP 140/78 | HR 56 | Temp 97.3°F | Ht 62.0 in | Wt 198.8 lb

## 2020-09-27 DIAGNOSIS — Z01818 Encounter for other preprocedural examination: Secondary | ICD-10-CM

## 2020-09-27 DIAGNOSIS — R059 Cough, unspecified: Secondary | ICD-10-CM | POA: Diagnosis not present

## 2020-09-27 NOTE — Progress Notes (Signed)
Careteam: Patient Care Team: Lauree Chandler, NP as PCP - General (Geriatric Medicine) Crista Luria, MD as Attending Physician (Dermatology) Irene Limbo, MD as Consulting Physician (Plastic Surgery) Waynette Buttery, MD as Referring Physician (Urology) Early, Arvilla Meres, MD as Consulting Physician (Vascular Surgery) Gaynelle Arabian, MD as Consulting Physician (Orthopedic Surgery) Monna Fam, MD as Consulting Physician (Ophthalmology)  PLACE OF SERVICE:  [place] Directive information Does Patient Have a Medical Advance Directive?: No, Would patient like information on creating a medical advance directive?: No - Patient declined  Allergies  Allergen Reactions   Azithromycin Other (See Comments)    thrush   Latex Hives and Other (See Comments)    Lip swelling and redness Lip swelling and redness   Other Rash   Codeine    Doxycycline    Gluten Meal Diarrhea   Tape Rash    Chief Complaint  Patient presents with   Medical Management of Chronic Issues    Pre op visit.     HPI: Patient is a 86 y.o. female for pre-op evaluation. Pt with hx of recurrent UTI, allergies, anemia, arthritis, asthma, migraines.  She is here today for pre-op evaluation for hip surgery.  She has not had any other joint surgery.  She reports she is always in pain if she moves in anyway.  She is concerned that she will not be able to do rehab. She was told rehab would be "like nothing"  She has umbilical hernia. No pain. Easily reducible.  Reports she has purple feet and swelling in the feet.  She had ABIs done in April 2021 and this was normal.    No chest pain, no palpitations, no shortness of breath.  Reports she does have a dry mouth. Reports she has had a cough (piece of dust in throat)  She has swelling to LE- this has been chronic. She used to do compression hose but due to pain she is able to do this.  No trouble with healing.  No N/V/diarrhea.  Chronic  incontinence of urination.   Review of Systems:  Review of Systems  Constitutional:  Negative for chills, fever and weight loss.  HENT:  Negative for tinnitus.   Respiratory:  Negative for cough, sputum production and shortness of breath.   Cardiovascular:  Negative for chest pain, palpitations and leg swelling.  Gastrointestinal:  Negative for abdominal pain, constipation, diarrhea and heartburn.  Genitourinary:  Negative for dysuria, frequency and urgency.  Musculoskeletal:  Positive for joint pain. Negative for back pain, falls and myalgias.  Skin: Negative.   Neurological:  Negative for dizziness and headaches.  Psychiatric/Behavioral:  Negative for depression and memory loss. The patient does not have insomnia.    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 Waynette Buttery   Past  Surgical History:  Procedure Laterality Date   ABLATION  2005   endovenous   APPENDECTOMY     AUGMENTATION MAMMAPLASTY Bilateral 1971   [facility] Dr. Tressa Busman   CHOLECYSTECTOMY  1986   chronic diarrhea since   COSMETIC SURGERY     LITHOTRIPSY Bilateral 2006   PERCUTANEOUS NEPHROSTOLITHOTOMY Left 06/30/2011   w/ cystoscopy by Dr. Roby Lofts at [facility] (and poss ureteral stent placement?)   PERCUTANEOUS NEPHROSTOLITHOTOMY Left 07/07/2011   w/ ureteroscopy and stone manipulation Dr. Roby Lofts at  [facility]. Surg >4 hrs with gen anesthesia and pt noticed new permanent cognitive deficits after   TUBAL LIGATION Bilateral 1968   URETEROLITHOTOMY Right 12/17/2011   Dr. Roby Lofts at [facility]   URETEROSCOPY WITH HOLMIUM LASER LITHOTRIPSY Left 07/07/2011   by percutaneous nephrostolithotomy with basket extraction, ureteral stent removal (placed 06/30/11?), and placement of Lt percutaneous nephrostomy tube by Dr. Roby Lofts at [facility] for numerous >2cm staghorn calcium oxalate stones in left renal pelvis retained after initial perc nephrostolithotomy wiht stent placement 1 wk prior    Social History:   reports that she has never smoked. She has never used smokeless tobacco. She reports that she does not drink alcohol and does not use drugs.  Family History  Problem Relation Age of Onset   Stroke Father    Hyperlipidemia Father    Lung cancer Mother    Cancer Maternal Grandmother     Medications: Patient's Medications  New Prescriptions   No medications on file  Previous Medications   CEPHALEXIN (KEFLEX) 500 MG CAPSULE    TAKE 1 CAPSULE BY MOUTH EVERY DAY   LINIMENTS (SALONPAS ARTHRITIS PAIN RELIEF EX)    Apply topically.   NADOLOL (CORGARD) 40 MG TABLET    TAKE 1 TABLET(40 MG) BY MOUTH DAILY   NYSTATIN-TRIAMCINOLONE OINTMENT (MYCOLOG)    Apply 1 application topically 2 (two) times daily.   ROSUVASTATIN (CRESTOR) 5 MG TABLET    Take 1 tablet (5 mg total) by mouth daily.  Modified Medications   No medications on file  Discontinued Medications   No medications on file    Physical Exam:  Vitals:   09/27/20 1025  BP: 140/78  Pulse: (!) 56  Temp: (!) 97.3 F (36.3 C)  TempSrc: Temporal  SpO2: 98%  Weight: 198 lb 12.8 oz (90.2 kg)  Height: '5\' 2"'  (1.575 m)   Body mass index is 36.36 kg/m. Wt Readings from Last 3 Encounters:  09/27/20 198 lb 12.8 oz (90.2 kg)  08/21/20 196 lb 9.6 oz (89.2 kg)  04/18/20 200 lb (90.7 kg)    Physical Exam Constitutional:      General:  She is not in acute distress.    Appearance: She is well-developed. She is not diaphoretic.  HENT:     Head: Normocephalic and atraumatic.     Mouth/Throat:     Pharynx: No oropharyngeal exudate.  Eyes:     Conjunctiva/sclera: Conjunctivae normal.     Pupils: Pupils are equal, round, and reactive to light.  Cardiovascular:     Rate and Rhythm: Normal rate and regular rhythm.     Heart sounds: Normal heart sounds.  Pulmonary:     Effort: Pulmonary effort is normal.     Breath sounds: Normal breath sounds.  Abdominal:     General: Bowel sounds are normal.     Palpations: Abdomen is soft.  Musculoskeletal:     Cervical back: Normal range of motion and neck supple.     Right lower leg: Edema present.  Left lower leg: Edema present.  Skin:    General: Skin is warm and dry.  Neurological:     Mental Status: She is alert and oriented to person, place, and time.     Gait: Gait abnormal (slow).  Psychiatric:        Mood and Affect: Mood normal.    Labs reviewed: Basic Metabolic Panel: Recent Labs    04/15/20 1204 08/20/20 1107  NA 142 143  K 4.1 4.6  CL 107 108  CO2 27 28  GLUCOSE 110* 103*  BUN 12 13  CREATININE 0.75 0.76  CALCIUM 8.9 8.9   Liver Function Tests: Recent Labs    08/20/20 1107  AST 18  ALT 10  BILITOT 0.7  PROT 6.0*   No results for input(s): LIPASE, AMYLASE in the last 8760 hours. No results for input(s): AMMONIA in the last 8760 hours. CBC: Recent Labs    04/15/20 1204  WBC 5.0  NEUTROABS 2,625  HGB 14.2  HCT 44.7  MCV 87.5  PLT 214   Lipid Panel: Recent Labs    04/15/20 1204 08/20/20 1107  CHOL 221* 118  HDL 46* 52  LDLCALC 137* 46  TRIG 235* 112  CHOLHDL 4.8 2.3   TSH: No results for input(s): TSH in the last 8760 hours. A1C: Lab Results  Component Value Date   HGBA1C 5.5 08/20/2020     Assessment/Plan 1. Pre-op examination -VS normal. Exam today normal except discoloration to LE which has been chronic and stable.  ABIs completed in April 21 and WNL. she is without chest pains, palpitations or shortness of breath. Increase risk factors due to age, debility and obesity.  -medically stable for right total hip.  - DG Chest 2 View; Future - CBC with Differential/Platelet - CMP with eGFR([facility]) - Protime-INR - POC Urinalysis Dipstick - EKG 12-Lead- SR with PAC, rate 63  2. Cough -reports dry cough for several month however without chest congestion or shortness of breath. Will get chest xray to evaluate.  - DG Chest 2 View; Future   Next appt: 12/25/2020 Carlos American. [place], [facility] [phone]

## 2020-09-28 LAB — CBC WITH DIFFERENTIAL/PLATELET
Absolute Monocytes: 572 cells/uL (ref 200–950)
Basophils Absolute: 28 cells/uL (ref 0–200)
Basophils Relative: 0.5 %
Eosinophils Absolute: 220 cells/uL (ref 15–500)
Eosinophils Relative: 4 %
HCT: 41.5 % (ref 35.0–45.0)
Hemoglobin: 13.3 g/dL (ref 11.7–15.5)
Lymphs Abs: 1771 cells/uL (ref 850–3900)
MCH: 28.5 pg (ref 27.0–33.0)
MCHC: 32 g/dL (ref 32.0–36.0)
MCV: 89.1 fL (ref 80.0–100.0)
MPV: 10 fL (ref 7.5–12.5)
Monocytes Relative: 10.4 %
Neutro Abs: 2910 cells/uL (ref 1500–7800)
Neutrophils Relative %: 52.9 %
Platelets: 163 10*3/uL (ref 140–400)
RBC: 4.66 10*6/uL (ref 3.80–5.10)
RDW: 13.4 % (ref 11.0–15.0)
Total Lymphocyte: 32.2 %
WBC: 5.5 10*3/uL (ref 3.8–10.8)

## 2020-09-28 LAB — COMPLETE METABOLIC PANEL WITH GFR
AG Ratio: 1.7 (calc) (ref 1.0–2.5)
ALT: 10 U/L (ref 6–29)
AST: 16 U/L (ref 10–35)
Albumin: 3.7 g/dL (ref 3.6–5.1)
Alkaline phosphatase (APISO): 41 U/L (ref 37–153)
BUN: 19 mg/dL (ref 7–25)
CO2: 28 mmol/L (ref 20–32)
Calcium: 8.9 mg/dL (ref 8.6–10.4)
Chloride: 106 mmol/L (ref 98–110)
Creat: 0.75 mg/dL (ref 0.60–0.88)
GFR, Est African American: 85 mL/min/{1.73_m2} (ref >=60)
GFR, Est Non African American: 73 mL/min/{1.73_m2} (ref >=60)
Globulin: 2.2 g/dL (calc) (ref 1.9–3.7)
Glucose, Bld: 100 mg/dL (ref 65–139)
Potassium: 4 mmol/L (ref 3.5–5.3)
Sodium: 142 mmol/L (ref 135–146)
Total Bilirubin: 0.6 mg/dL (ref 0.2–1.2)
Total Protein: 5.9 g/dL — ABNORMAL LOW (ref 6.1–8.1)

## 2020-09-28 LAB — PROTIME-INR
INR: 1
Prothrombin Time: 10.5 s (ref 9.0–11.5)

## 2020-10-08 ENCOUNTER — Ambulatory Visit
Admission: RE | Admit: 2020-10-08 | Discharge: 2020-10-08 | Disposition: A | Discharge status: Home / Self Care | Payer: Medicare Other | Source: Ambulatory Visit | Attending: Nurse Practitioner | Admitting: Nurse Practitioner

## 2020-10-08 DIAGNOSIS — R059 Cough, unspecified: Secondary | ICD-10-CM

## 2020-10-08 DIAGNOSIS — Z01818 Encounter for other preprocedural examination: Secondary | ICD-10-CM

## 2020-10-29 ENCOUNTER — Other Ambulatory Visit: Payer: Self-pay

## 2020-10-29 DIAGNOSIS — I872 Venous insufficiency (chronic) (peripheral): Secondary | ICD-10-CM

## 2020-10-31 ENCOUNTER — Ambulatory Visit (HOSPITAL_COMMUNITY)
Admission: RE | Admit: 2020-10-31 | Discharge: 2020-10-31 | Disposition: A | Discharge status: Home / Self Care | Payer: Medicare Other | Source: Ambulatory Visit | Attending: Surgery | Admitting: Surgery

## 2020-10-31 ENCOUNTER — Other Ambulatory Visit: Payer: Self-pay

## 2020-10-31 DIAGNOSIS — I872 Venous insufficiency (chronic) (peripheral): Secondary | ICD-10-CM | POA: Diagnosis not present

## 2020-11-04 ENCOUNTER — Ambulatory Visit (INDEPENDENT_AMBULATORY_CARE_PROVIDER_SITE_OTHER): Payer: Medicare Other | Admitting: Surgery

## 2020-11-04 ENCOUNTER — Encounter: Payer: Self-pay | Admitting: Surgery

## 2020-11-04 ENCOUNTER — Other Ambulatory Visit: Payer: Self-pay

## 2020-11-04 VITALS — BP 119/75 | HR 48 | Temp 98.0°F | Resp 20 | Ht 62.0 in | Wt 199.0 lb

## 2020-11-04 DIAGNOSIS — I872 Venous insufficiency (chronic) (peripheral): Secondary | ICD-10-CM

## 2020-11-04 NOTE — Progress Notes (Signed)
Vascular and Vein Specialist of [facility]  Patient name: Susan Boone MRN: [id number] DOB: June 09, 1934 Sex: female   REQUESTING PROVIDER:    Dr. Charlann Boxer   REASON FOR CONSULT:    Leg swelling  HISTORY OF PRESENT ILLNESS:   Susan Boone is a 86 y.o. female, who is referred for evaluation of vascular clearance for upcoming orthopedic surgery.  The patient has a history of left great saphenous venous ablation by Dr. Arbie Cookey many years ago.  She continues to have significant leg swelling.  She denies any history of DVT.  She saw Dr. Edilia Bo in 2018 for persistent leg swelling.  Noninvasive treatment was encouraged.  The patient initially has done well with compression socks however now has difficulty getting them on.  Her biggest issue is getting cleared for hip surgery and then possibly bilateral knee replacement.  She does not have any open wounds.  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 Wilburn Mylar     FAMILY HISTORY   Family History  Problem Relation Age of Onset   Stroke Father    Hyperlipidemia Father    Lung cancer Mother    Cancer Maternal Grandmother     SOCIAL HISTORY:   Social History   Socioeconomic History   Marital status: Divorced    Spouse name: Not on file   Number of children: Not  on file   Years of education: Not on file   Highest education level: Not on file  Occupational History   Occupation: retired  Tobacco Use   Smoking status: Never   Smokeless tobacco: Never  Vaping Use   Vaping Use: Never used  Substance and Sexual Activity   Alcohol use: No    Alcohol/week: 0.0 standard drinks   Drug use: No   Sexual activity: Not on file  Other Topics Concern   Not on file  Social History Narrative   Previously had occasional 1-2 cigarettes but none since 1981   Rarely has caffeine and not coffee   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   She has challenges preparing meals and bathing and dressing due to mobility   Social Determinants of Health   Financial Resource Strain: Not on file  Food Insecurity: Not on file  Transportation Needs: Not on file  Physical Activity: Not on file  Stress: Not on file  Social Connections: Not on file  Intimate Partner Violence: Not on file    ALLERGIES:    Allergies  Allergen Reactions   Azithromycin Other (See Comments)    thrush  Latex Hives and Other (See Comments)    Lip swelling and redness Lip swelling and redness   Other Rash   Codeine    Doxycycline    Gluten Meal Diarrhea   Tape Rash    CURRENT MEDICATIONS:    Current Outpatient Medications  Medication Sig Dispense Refill   cephALEXin (KEFLEX) 250 MG capsule Take 250 mg by mouth at bedtime.     Liniments (SALONPAS ARTHRITIS PAIN RELIEF EX) Apply topically.     nadolol (CORGARD) 40 MG tablet TAKE 1 TABLET(40 MG) BY MOUTH DAILY 90 tablet 1   nystatin-triamcinolone ointment (MYCOLOG) Apply 1 application topically 2 (two) times daily. 60 g 3   rosuvastatin (CRESTOR) 5 MG tablet Take 1 tablet (5 mg total) by mouth daily. 90 tablet 3   No current facility-administered  medications for this visit.    REVIEW OF SYSTEMS:   [X]  denotes positive finding, [ ]  denotes negative finding Cardiac  Comments:  Chest pain or chest pressure:    Shortness of breath upon exertion:    Short of breath when lying flat:    Irregular heart rhythm:        Vascular    Pain in calf, thigh, or hip brought on by ambulation:    Pain in feet at night that wakes you up from your sleep:     Blood clot in your veins:    Leg swelling:  x       Pulmonary    Oxygen at home:    Productive cough:     Wheezing:         Neurologic    Sudden weakness in arms or legs:     Sudden numbness in arms or legs:     Sudden onset of difficulty speaking or slurred speech:    Temporary loss of vision in one eye:     Problems with dizziness:         Gastrointestinal    Blood in stool:      Vomited blood:         Genitourinary    Burning when urinating:     Blood in urine:        Psychiatric    Major depression:         Hematologic    Bleeding problems:    Problems with blood clotting too easily:        Skin    Rashes or ulcers:        Constitutional    Fever or chills:     PHYSICAL EXAM:   There were no vitals filed for this visit.  GENERAL: The patient is a well-nourished female, in no acute distress. The vital signs are documented above. CARDIAC: There is a regular rate and rhythm.  VASCULAR: Bilateral pitting edema.  Palpable dorsalis pedis pulse bilaterally PULMONARY: Nonlabored respirations MUSCULOSKELETAL: There are no major deformities or cyanosis. NEUROLOGIC: No focal weakness or paresthesias are detected. SKIN: Bluish discoloration to most of her toes PSYCHIATRIC: The patient has a normal affect.  STUDIES:   I have reviewed the following: Bilateral:  - No evidence of deep vein thrombosis seen in the lower extremities,  bilaterally, from the common femoral through the popliteal veins.  - No evidence of superficial venous thrombosis in the lower  extremities,  bilaterally.     Right:  - Venous reflux is noted in the right common femoral vein.  - Venous reflux is noted in the right sapheno-femoral junction.  - Venous reflux  is noted in the right greater saphenous vein in the thigh.  - Venous reflux is noted in the right short saphenous vein.     Left:  - Venous reflux is noted in the left common femoral vein.  - Venous reflux is noted in the left sapheno-femoral junction.  - Successful treatment of the thigh segment of the great saphenous   ASSESSMENT and PLAN   Chronic venous insufficiency: Based on the patient's overall medical issues, I think intervening for her venous insufficiency would not be in her best interest at this time.  She has deep venous reflux bilaterally, therefore I think we would have a limited benefit from superficial vein intervention on the right.  Her left side continues to be ablated.  I think her best option is to wear compression.  We talked about socks with zippers and also a donning tool.  She was measured for stockings today and will see [facility] for assistance in getting them on.  Despite her bluish colored toes, she has palpable dorsalis pedis pulses.  From a vascular perspective, I see no reason why she cannot proceed with orthopedic surgery.   Charlena Cross, MD, FACS Vascular and Vein Specialists of [facility] [phone] Pager [phone]

## 2020-12-05 NOTE — Progress Notes (Addendum)
COVID swab appointment: 12-13-20  COVID Vaccine Completed: Yes x5 Date COVID Vaccine completed: 04-07-19, 04-28-19 Has received booster: Yes x3  COVID vaccine manufacturer: Pfizer     Date of COVID positive in last 90 days:  No  PCP - Abbey Chatters, NP Cardiologist - N/A Vascular - Durene Cal, MD.  Clearance in note dated 11-04-20 not in Epic  Medical clearance in Epic dated 09-27-20 by Abbey Chatters, NP  Chest x-ray - 10-08-20 Epic EKG - 09-27-20 Epic Stress Test - greater than 5 years ECHO - greater than 2 years.  Epic Cardiac Cath - N/A Pacemaker/ICD device last checked: Spinal Cord Stimulator:  Sleep Study - N/A CPAP -   Fasting Blood Sugar - N/A Checks Blood Sugar _____ times a day  Blood Thinner Instructions:  N/A Aspirin Instructions: Last Dose:  Activity level:  Unable to go up a flight of stairs due to chronic venous insufficiency and hip pain.   and perform activities of daily living without stopping and without symptoms of chest pain or shortness of breath.       Anesthesia review:  Patient has chronic venous insufficiency and toes are purple. Legs and feet were examined by Christeen Douglas, PA-C.    Patient denies shortness of breath, fever, cough and chest pain at PAT appointment   Patient verbalized understanding of instructions that were given to them at the PAT appointment. Patient was also instructed that they will need to review over the PAT instructions again at home before surgery.

## 2020-12-05 NOTE — Patient Instructions (Addendum)
DUE TO COVID-19 ONLY ONE VISITOR IS ALLOWED TO COME WITH YOU AND STAY IN THE WAITING ROOM ONLY DURING PRE OP AND PROCEDURE.   **NO VISITORS ARE ALLOWED IN THE SHORT STAY AREA OR RECOVERY ROOM!!**  IF YOU WILL BE ADMITTED INTO THE HOSPITAL YOU ARE ALLOWED ONLY TWO SUPPORT PEOPLE DURING VISITATION HOURS ONLY (10AM -8PM)   The support person(s) may change daily. The support person(s) must pass our screening, gel in and out, and wear a mask at all times, including in the patient's room. Patients must also wear a mask when staff or their support person are in the room.  No visitors under the age of 35. Any visitor under the age of 60 must be accompanied by an adult.   COVID SWAB TESTING MUST BE COMPLETED ON:  Friday, 12-13-20 Between the hours of 8 and 3  **MUST PRESENT COMPLETED FORM AT TESTING SITE**    [address] [place] (backside of the building)  You are not required to quarantine, however you are required to wear a well-fitted mask when you are out and around people not in your household.  Hand Hygiene often Do NOT share personal items Notify your provider if you are in close contact with someone who has COVID or you develop fever 100.4 or greater, new onset of sneezing, cough, sore throat, shortness of breath or body aches.        Your procedure is scheduled on: Tuesday, 12-17-20   Report to [facility] Main  Entrance     Report to admitting at 1:00 PM   Call this number if you have problems the morning of surgery [phone]   Do not eat food :After Midnight.   May have liquids until 12:45 PM day of surgery  CLEAR LIQUID DIET  Foods Allowed                                                                     Foods Excluded  Water, Black Coffee (no milk/no creamer) and tea, regular and decaf                              liquids that you cannot  Plain Jell-O in any flavor  (No red)                         see through such as: Fruit ices (not with  fruit pulp)                                 milk, soups, orange juice  Iced Popsicles (No red)                                    All solid food                             Apple juices Sports drinks like Gatorade (No red) Lightly seasoned clear broth or consume(fat free) Sugar    Complete one Ensure  drink the morning of surgery at  12:45 PM  the day of surgery.     The day of surgery:  Drink ONE (1) Pre-Surgery Clear Ensure the morning of surgery. Drink in one sitting. Do not sip.  This drink was given to you during your hospital  pre-op appointment visit. Nothing else to drink after completing the Pre-Surgery Clear Ensure          If you have questions, please contact your surgeon's office.     Oral Hygiene is also important to reduce your risk of infection.                                    Remember - BRUSH YOUR TEETH THE MORNING OF SURGERY WITH YOUR REGULAR TOOTHPASTE   Do NOT smoke after Midnight  Take these medicines the morning of surgery with A SIP OF WATER: None   Stop all vitamins and herbal supplements a week before surgery             You may not have any metal on your body including hair pins, jewelry, and body piercing             Do not wear make-up, lotions, powders, perfumes or deodorant  Do not wear nail polish including gel and S&S, artificial/acrylic nails, or any other type of covering on natural nails including finger and toenails. If you have artificial nails, gel coating, etc. that needs to be removed by a nail salon please have this removed prior to surgery or surgery may need to be canceled/ delayed if the surgeon/ anesthesia feels like they are unable to be safely monitored.   Do not shave  48 hours prior to surgery.   Do not bring valuables to the hospital. [facility] IS NOT RESPONSIBLE FOR VALUABLES.   Contacts, dentures or bridgework may not be worn into surgery.   Bring small overnight bag day of surgery.   Special Instructions: Bring a copy  of your healthcare power of attorney and living will documents the day of surgery if you haven't scanned them in before.  Please read over the following fact sheets you were given: IF YOU HAVE QUESTIONS ABOUT YOUR PRE OP INSTRUCTIONS PLEASE CALL [phone] [facility] - Preparing for Surgery Before surgery, you can play an important role.  Because skin is not sterile, your skin needs to be as free of germs as possible.  You can reduce the number of germs on your skin by washing with CHG (chlorahexidine gluconate) soap before surgery.  CHG is an antiseptic cleaner which kills germs and bonds with the skin to continue killing germs even after washing. Please DO NOT use if you have an allergy to CHG or antibacterial soaps.  If your skin becomes reddened/irritated stop using the CHG and inform your nurse when you arrive at Short Stay. Do not shave (including legs and underarms) for at least 48 hours prior to the first CHG shower.  You may shave your face/neck.  Please follow these instructions carefully:  1.  Shower with CHG Soap the night before surgery and the  morning of surgery.  2.  If you choose to wash your hair, wash your hair first as usual with your normal  shampoo.  3.  After you shampoo, rinse your hair and body thoroughly to remove the shampoo.  4.  Use CHG as you would any other liquid soap.  You can apply chg directly to the skin and wash.  Gently with a scrungie or clean washcloth.  5.  Apply the CHG Soap to your body ONLY FROM THE NECK DOWN.   Do   not use on face/ open                           Wound or open sores. Avoid contact with eyes, ears mouth and   genitals (private parts).                       Wash face,  Genitals (private parts) with your normal soap.             6.  Wash thoroughly, paying special attention to the area where your    surgery  will be performed.  7.  Thoroughly rinse your body with warm water from the neck down.  8.  DO  NOT shower/wash with your normal soap after using and rinsing off the CHG Soap.                9.  Pat yourself dry with a clean towel.            10.  Wear clean pajamas.            11.  Place clean sheets on your bed the night of your first shower and do not  sleep with pets. Day of Surgery : Do not apply any lotions/deodorants the morning of surgery.  Please wear clean clothes to the hospital/surgery center.  FAILURE TO FOLLOW THESE INSTRUCTIONS MAY RESULT IN THE CANCELLATION OF YOUR SURGERY  PATIENT SIGNATURE_________________________________  NURSE SIGNATURE__________________________________  ________________________________________________________________________   Rogelia Mire  An incentive spirometer is a tool that can help keep your lungs clear and active. This tool measures how well you are filling your lungs with each breath. Taking long deep breaths may help reverse or decrease the chance of developing breathing (pulmonary) problems (especially infection) following: A long period of time when you are unable to move or be active. BEFORE THE PROCEDURE  If the spirometer includes an indicator to show your best effort, your nurse or respiratory therapist will set it to a desired goal. If possible, sit up straight or lean slightly forward. Try not to slouch. Hold the incentive spirometer in an upright position. INSTRUCTIONS FOR USE  Sit on the edge of your bed if possible, or sit up as far as you can in bed or on a chair. Hold the incentive spirometer in an upright position. Breathe out normally. Place the mouthpiece in your mouth and seal your lips tightly around it. Breathe in slowly and as deeply as possible, raising the piston or the ball toward the top of the column. Hold your breath for 3-5 seconds or for as long as possible. Allow the piston or ball to fall to the bottom of the column. Remove the mouthpiece from your mouth and breathe out normally. Rest for a few  seconds and repeat Steps 1 through 7 at least 10 times every 1-2 hours when you are awake. Take your time and take a few normal breaths between deep breaths. The spirometer may include an indicator to show your best effort. Use the indicator as a goal to work toward during each repetition. After each set of 10 deep breaths, practice coughing to be sure  your lungs are clear. If you have an incision (the cut made at the time of surgery), support your incision when coughing by placing a pillow or rolled up towels firmly against it. Once you are able to get out of bed, walk around indoors and cough well. You may stop using the incentive spirometer when instructed by your caregiver.  RISKS AND COMPLICATIONS Take your time so you do not get dizzy or light-headed. If you are in pain, you may need to take or ask for pain medication before doing incentive spirometry. It is harder to take a deep breath if you are having pain. AFTER USE Rest and breathe slowly and easily. It can be helpful to keep track of a log of your progress. Your caregiver can provide you with a simple table to help with this. If you are using the spirometer at home, follow these instructions: [place] IF:  You are having difficultly using the spirometer. You have trouble using the spirometer as often as instructed. Your pain medication is not giving enough relief while using the spirometer. You develop fever of 100.5 F (38.1 C) or higher. SEEK IMMEDIATE MEDICAL CARE IF:  You cough up bloody sputum that had not been present before. You develop fever of 102 F (38.9 C) or greater. You develop worsening pain at or near the incision site. MAKE SURE YOU:  Understand these instructions. Will watch your condition. Will get help right away if you are not doing well or get worse. Document Released: 07/20/2006 Document Revised: 06/01/2011 Document Reviewed: 09/20/2006 Exit[employer] Patient Information 2014 ExitCare,  [place].   ________________________________________________________________________  WHAT IS A BLOOD TRANSFUSION? Blood Transfusion Information  A transfusion is the replacement of blood or some of its parts. Blood is made up of multiple cells which provide different functions. Red blood cells carry oxygen and are used for blood loss replacement. White blood cells fight against infection. Platelets control bleeding. Plasma helps clot blood. Other blood products are available for specialized needs, such as hemophilia or other clotting disorders. BEFORE THE TRANSFUSION  Who gives blood for transfusions?  Healthy volunteers who are fully evaluated to make sure their blood is safe. This is blood bank blood. Transfusion therapy is the safest it has ever been in the practice of medicine. Before blood is taken from a donor, a complete history is taken to make sure that person has no history of diseases nor engages in risky social behavior (examples are intravenous drug use or sexual activity with multiple partners). The donor's travel history is screened to minimize risk of transmitting infections, such as malaria. The donated blood is tested for signs of infectious diseases, such as HIV and hepatitis. The blood is then tested to be sure it is compatible with you in order to minimize the chance of a transfusion reaction. If you or a relative donates blood, this is often done in anticipation of surgery and is not appropriate for emergency situations. It takes many days to process the donated blood. RISKS AND COMPLICATIONS Although transfusion therapy is very safe and saves many lives, the main dangers of transfusion include:  Getting an infectious disease. Developing a transfusion reaction. This is an allergic reaction to something in the blood you were given. Every precaution is taken to prevent this. The decision to have a blood transfusion has been considered carefully by your caregiver before blood is  given. Blood is not given unless the benefits outweigh the risks. AFTER THE TRANSFUSION Right after receiving a blood  transfusion, you will usually feel much better and more energetic. This is especially true if your red blood cells have gotten low (anemic). The transfusion raises the level of the red blood cells which carry oxygen, and this usually causes an energy increase. The nurse administering the transfusion will monitor you carefully for complications. HOME CARE INSTRUCTIONS  No special instructions are needed after a transfusion. You may find your energy is better. Speak with your caregiver about any limitations on activity for underlying diseases you may have. SEEK MEDICAL CARE IF:  Your condition is not improving after your transfusion. You develop redness or irritation at the intravenous (IV) site. SEEK IMMEDIATE MEDICAL CARE IF:  Any of the following symptoms occur over the next 12 hours: Shaking chills. You have a temperature by mouth above 102 F (38.9 C), not controlled by medicine. Chest, back, or muscle pain. People around you feel you are not acting correctly or are confused. Shortness of breath or difficulty breathing. Dizziness and fainting. You get a rash or develop hives. You have a decrease in urine output. Your urine turns a dark color or changes to pink, red, or brown. Any of the following symptoms occur over the next 10 days: You have a temperature by mouth above 102 F (38.9 C), not controlled by medicine. Shortness of breath. Weakness after normal activity. The white part of the eye turns yellow (jaundice). You have a decrease in the amount of urine or are urinating less often. Your urine turns a dark color or changes to pink, red, or brown. Document Released: 03/06/2000 Document Revised: 06/01/2011 Document Reviewed: 10/24/2007 [facility] Patient Information 2014 [place], [place].  _______________________________________________________________________

## 2020-12-06 ENCOUNTER — Encounter (HOSPITAL_COMMUNITY): Payer: Self-pay

## 2020-12-06 ENCOUNTER — Encounter (HOSPITAL_COMMUNITY)
Admission: RE | Admit: 2020-12-06 | Discharge: 2020-12-06 | Disposition: A | Discharge status: Home / Self Care | Payer: Medicare Other | Source: Ambulatory Visit | Attending: Orthopedic Surgery | Admitting: Orthopedic Surgery

## 2020-12-06 ENCOUNTER — Other Ambulatory Visit: Payer: Self-pay

## 2020-12-06 DIAGNOSIS — Z01812 Encounter for preprocedural laboratory examination: Secondary | ICD-10-CM | POA: Diagnosis not present

## 2020-12-06 HISTORY — DX: Venous insufficiency (chronic) (peripheral): I87.2

## 2020-12-06 HISTORY — DX: Other complications of anesthesia, initial encounter: T88.59XA

## 2020-12-06 HISTORY — DX: Personal history of urinary calculi: Z87.442

## 2020-12-06 LAB — COMPREHENSIVE METABOLIC PANEL
ALT: 12 U/L (ref 0–44)
AST: 19 U/L (ref 15–41)
Albumin: 3.8 g/dL (ref 3.5–5.0)
Alkaline Phosphatase: 44 U/L (ref 38–126)
Anion gap: 5 (ref 5–15)
BUN: 17 mg/dL (ref 8–23)
CO2: 27 mmol/L (ref 22–32)
Calcium: 9 mg/dL (ref 8.9–10.3)
Chloride: 108 mmol/L (ref 98–111)
Creatinine, Ser: 0.51 mg/dL (ref 0.44–1.00)
GFR, Estimated: >60 mL/min (ref >60)
Glucose, Bld: 113 mg/dL — ABNORMAL HIGH (ref 70–99)
Potassium: 4.5 mmol/L (ref 3.5–5.1)
Sodium: 140 mmol/L (ref 135–145)
Total Bilirubin: 0.8 mg/dL (ref 0.3–1.2)
Total Protein: 6.5 g/dL (ref 6.5–8.1)

## 2020-12-06 LAB — CBC
HCT: 43.6 % (ref 36.0–46.0)
Hemoglobin: 13.6 g/dL (ref 12.0–15.0)
MCH: 28 pg (ref 26.0–34.0)
MCHC: 31.2 g/dL (ref 30.0–36.0)
MCV: 89.7 fL (ref 80.0–100.0)
Platelets: 171 10*3/uL (ref 150–400)
RBC: 4.86 MIL/uL (ref 3.87–5.11)
RDW: 14.8 % (ref 11.5–15.5)
WBC: 6.5 10*3/uL (ref 4.0–10.5)
nRBC: 0 % (ref 0.0–0.2)

## 2020-12-06 LAB — TYPE AND SCREEN
ABO/RH(D): A POS
Antibody Screen: NEGATIVE

## 2020-12-06 LAB — SURGICAL PCR SCREEN
MRSA, PCR: NEGATIVE
Staphylococcus aureus: NEGATIVE

## 2020-12-06 NOTE — Progress Notes (Signed)
Anesthesia Chart Review   Case: [id number] Date/Time: 12/17/20 1530   Procedure: TOTAL HIP ARTHROPLASTY ANTERIOR APPROACH (Right: Hip)   Anesthesia type: Spinal   Pre-op diagnosis: Right hip osteoarthritis   Location: [facility] ROOM 09 / [facility]   Surgeons: Durene Romans, MD       DISCUSSION:86 y.o. never smoker with h/o chronic venous insufficiency of lower extremities, right hip OA scheduled for above procedure 12/17/2020 with Dr. Durene Romans.   Pt seen by vascular surgeon for evaluation prior to surgery.  Per Dr. Myra Gianotti, "Chronic venous insufficiency: Based on the patient's overall medical issues, I think intervening for her venous insufficiency would not be in her best interest at this time.  She has deep venous reflux bilaterally, therefore I think we would have a limited benefit from superficial vein intervention on the right.  Her left side continues to be ablated.  I think her best option is to wear compression.  We talked about socks with zippers and also a donning tool.  She was measured for stockings today and will see [facility] for assistance in getting them on.  Despite her bluish colored toes, she has palpable dorsalis pedis pulses.  From a vascular perspective, I see no reason why she cannot proceed with orthopedic surgery."  I examined her during PAT visit with no changes from Dr. Estanislado Spire OV.  Pt states stable since that time.   Pt seen by PCP 09/27/2020. Per PCP, "medically stable for right total hip."  Anticipate pt can proceed with planned procedure barring acute status change.   VS: BP (!) 146/65   Pulse 83   Temp 36.9 C (Oral)   Resp 20   Ht 5\' 2"  (1.575 m)   Wt 87.5 kg   SpO2 99%   BMI 35.30 kg/m   PROVIDERS: , NP is PCP   Sharon Seller, MD is Vascular Surgeon LABS: Labs reviewed: Acceptable for surgery. (all labs ordered are listed, but only abnormal results are displayed)  Labs Reviewed  SURGICAL PCR SCREEN  CBC  COMPREHENSIVE  METABOLIC PANEL  TYPE AND SCREEN     IMAGES:   EKG: 09/27/2020 Rate 63 bpm  Sinus rhythm with occasional PACs  CV: Echo 10/19/2008 Normal LV systolic function.  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    Past Surgical History:  Procedure Laterality Date   ABLATION  2005   endovenous   APPENDECTOMY     AUGMENTATION MAMMAPLASTY Bilateral 1971   [facility] Dr. 2006  CHOLECYSTECTOMY  1986   chronic diarrhea since   COSMETIC SURGERY     LITHOTRIPSY Bilateral 2006   PERCUTANEOUS NEPHROSTOLITHOTOMY Left 06/30/2011   w/ cystoscopy by Dr. Karena Addison at [facility] (and poss ureteral stent placement?)   PERCUTANEOUS NEPHROSTOLITHOTOMY Left 07/07/2011   w/ ureteroscopy and stone manipulation Dr. Karena Addison at [facility]. Surg >4 hrs with gen anesthesia and pt noticed new permanent cognitive deficits after   TUBAL LIGATION Bilateral 1968   URETEROLITHOTOMY Right 12/17/2011   Dr. Karena Addison at [facility]   URETEROSCOPY WITH HOLMIUM LASER LITHOTRIPSY Left 07/07/2011   by  percutaneous nephrostolithotomy with basket extraction, ureteral stent removal (placed 06/30/11?), and placement of Lt percutaneous nephrostomy tube by Dr. Karena Addison at [facility] for numerous >2cm staghorn calcium oxalate stones in left renal pelvis retained after initial perc nephrostolithotomy wiht stent placement 1 wk prior     MEDICATIONS:  cephALEXin (KEFLEX) 250 MG capsule   Liniments (SALONPAS ARTHRITIS PAIN RELIEF EX)   nadolol (CORGARD) 40 MG tablet   nystatin-triamcinolone ointment (MYCOLOG)   rosuvastatin (CRESTOR) 5 MG tablet   No current facility-administered medications for this encounter.    Jodell Cipro Ward, PA-C [facility]Surgical Testing [phone]

## 2020-12-10 NOTE — Anesthesia Preprocedure Evaluation (Addendum)
Anesthesia Evaluation  Patient identified by MRN, date of birth, ID band Patient awake    Airway Mallampati: II       Dental   Pulmonary asthma ,    breath sounds clear to auscultation       Cardiovascular negative cardio ROS   Rhythm:Regular Rate:Normal     Neuro/Psych  Headaches,    GI/Hepatic Neg liver ROS, GERD  ,  Endo/Other    Renal/GU Renal disease     Musculoskeletal  (+) Arthritis ,   Abdominal   Peds  Hematology   Anesthesia Other Findings   Reproductive/Obstetrics                             Anesthesia Physical Anesthesia Plan  ASA: 3  Anesthesia Plan: Spinal   Post-op Pain Management:    Induction: Intravenous  PONV Risk Score and Plan: 2 and Ondansetron and Dexamethasone  Airway Management Planned: Nasal Cannula and Simple Face Mask  Additional Equipment:   Intra-op Plan:   Post-operative Plan:   Informed Consent: I have reviewed the patients History and Physical, chart, labs and discussed the procedure including the risks, benefits and alternatives for the proposed anesthesia with the patient or authorized representative who has indicated his/her understanding and acceptance.     Dental advisory given  Plan Discussed with: CRNA and Anesthesiologist  Anesthesia Plan Comments: (See PAT note 12/06/2020, Jodell Cipro Ward, PA-C)      Anesthesia Quick Evaluation

## 2020-12-13 ENCOUNTER — Other Ambulatory Visit: Payer: Self-pay | Admitting: Orthopedic Surgery

## 2020-12-13 LAB — SARS CORONAVIRUS 2 (TAT 6-24 HRS): SARS Coronavirus 2: NEGATIVE

## 2020-12-16 ENCOUNTER — Encounter (HOSPITAL_COMMUNITY): Payer: Self-pay | Admitting: Orthopedic Surgery

## 2020-12-16 NOTE — H&P (Signed)
TOTAL HIP ADMISSION H&P  Patient is admitted for right total hip arthroplasty.  Subjective:  Chief Complaint: right hip pain  HPI: Susan Boone, 86 y.o. female, has a history of pain and functional disability in the right hip(s) due to arthritis and patient has failed non-surgical conservative treatments for greater than 12 weeks to include NSAID's and/or analgesics and activity modification.  Onset of symptoms was gradual starting 2 years ago with gradually worsening course since that time.The patient noted no past surgery on the right hip(s).  Patient currently rates pain in the right hip at 7 out of 10 with activity. Patient has worsening of pain with activity and weight bearing, pain that interfers with activities of daily living, and pain with passive range of motion. Patient has evidence of joint space narrowing by imaging studies. This condition presents safety issues increasing the risk of falls. There is no current active infection.  Patient Active Problem List   Diagnosis Date Noted   Hyperlipidemia, mixed 04/18/2020   Dry mouth 04/18/2020   Hyperglycemia 12/11/2019   Right kidney mass 07/16/2019   Ruptured right breast implant, sequela 07/16/2019   Ruptured left breast implant, sequela 07/16/2019   Neuralgia, postherpetic 07/16/2019   Vitamin D deficiency 05/05/2017   Vitamin B12 deficiency 05/05/2017   Recurrent UTI 07/07/2013   Calculus of kidney 06/23/2011   Environmental allergies 06/23/2011   Lower leg edema 06/23/2011   Migraine 06/23/2011   UMBILICAL HERNIA 10/20/2007   Irritable bowel syndrome with diarrhea 10/20/2007   RENAL CYST, LEFT 10/20/2007   ARTHRITIS 10/20/2007   DYSPHAGIA UNSPECIFIED 10/20/2007   DYSPHAGIA 10/20/2007   DIARRHEA 10/20/2007   ANEMIA, HX OF 10/20/2007   NEPHROLITHIASIS, HX OF 10/20/2007   Past Medical History:  Diagnosis Date   Allergy    environmental   Anemia    Arthritis    Asthma    Chronic diarrhea 1986   since  cholecystecomy   Chronic venous insufficiency of lower extremity    Complication of anesthesia    Hard to wake up after kidney stone surgery   Esophageal reflux    H/O orthostatic hypotension    History of kidney stones    Kidney stones 2000    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 Wilburn Mylar    Past Surgical History:  Procedure Laterality Date   ABLATION  2005   endovenous   APPENDECTOMY     AUGMENTATION MAMMAPLASTY Bilateral 1971   [facility] Dr. Tyrell Antonio   CHOLECYSTECTOMY  1986   chronic diarrhea since   COSMETIC SURGERY     LITHOTRIPSY Bilateral 2006   PERCUTANEOUS NEPHROSTOLITHOTOMY Left 06/30/2011   w/ cystoscopy by Dr. Karena Addison at [facility] (and poss ureteral stent placement?)   PERCUTANEOUS NEPHROSTOLITHOTOMY Left 07/07/2011   w/ ureteroscopy and stone manipulation Dr. Karena Addison at [facility]. Surg >4 hrs with gen anesthesia and pt noticed new permanent cognitive deficits after   TUBAL LIGATION Bilateral 1968   URETEROLITHOTOMY Right 12/17/2011   Dr. Karena Addison at [facility]   URETEROSCOPY WITH HOLMIUM LASER LITHOTRIPSY Left 07/07/2011   by percutaneous nephrostolithotomy with basket extraction, ureteral stent  removal (placed 06/30/11?), and placement of Lt percutaneous nephrostomy tube by Dr. Karena Addison at [facility] for numerous >2cm staghorn calcium oxalate stones in left renal pelvis retained after initial perc nephrostolithotomy wiht stent placement 1 wk prior     No current facility-administered medications for this encounter.   Current Outpatient Medications  Medication Sig Dispense  Refill Last Dose   cephALEXin (KEFLEX) 250 MG capsule Take 500 mg by mouth at bedtime.      Liniments (SALONPAS ARTHRITIS PAIN RELIEF EX) Apply 1 patch topically daily as needed (pain).      nadolol (CORGARD) 40 MG tablet TAKE 1 TABLET(40 MG) BY MOUTH DAILY (Patient taking differently: Take 40 mg by mouth at bedtime. TAKE 1 TABLET(40 MG) BY MOUTH DAILY) 90 tablet 1    nystatin-triamcinolone ointment (MYCOLOG) Apply 1 application topically 2 (two) times daily. (Patient taking differently: Apply 1 application topically 2 (two) times daily as needed (irritation).) 60 g 3    rosuvastatin (CRESTOR) 5 MG tablet Take 1 tablet (5 mg total) by mouth daily. (Patient taking differently: Take 5 mg by mouth at bedtime.) 90 tablet 3    Allergies  Allergen Reactions   Azithromycin Other (See Comments)    thrush   Latex Hives and Other (See Comments)    Lip swelling and redness    Codeine Itching   Doxycycline     Unsure of reaction    Nsaids     Stomach ulcers    Gluten Meal Diarrhea   Tape Rash    Latex Bandaid     Social History   Tobacco Use   Smoking status: Never   Smokeless tobacco: Never  Substance Use Topics   Alcohol use: No    Alcohol/week: 0.0 standard drinks    Family History  Problem Relation Age of Onset   Stroke Father    Hyperlipidemia Father    Lung cancer Mother    Cancer Maternal Grandmother      Review of Systems  Constitutional:  Negative for chills and fever.  Respiratory:  Negative for cough and shortness of breath.   Cardiovascular:  Negative for chest pain.  Gastrointestinal:  Negative for nausea and vomiting.  Musculoskeletal:  Positive for arthralgias.   Objective:  Physical Exam Well nourished and well developed. General: Alert and oriented x3, cooperative and pleasant, no acute distress. Head: normocephalic, atraumatic, neck supple. Eyes: EOMI.  Musculoskeletal: Bilateral knee exams: No palpable effusion, warmth erythema Valgus orientation of  both knees Slight flexion contracture Crepitation with flexion over 110 degrees Tenderness lateral and anterior Bilateral lower extremity edema with purplish hue to her lower extremities.  Calves soft and nontender. Motor function intact in LE. Strength 5/5 LE bilaterally. Neuro: Distal pulses 2+. Sensation to light touch intact in LE.  Vital signs in last 24 hours:    Labs:   Estimated body mass index is 35.3 kg/m as calculated from the following:   Height as of 12/06/20: 5\' 2"  (1.575 m).   Weight as of 12/06/20: 87.5 kg.   Imaging Review Plain radiographs demonstrate severe degenerative joint disease of the right hip(s). The bone quality appears to be adequate for age and reported activity level.      Assessment/Plan:  End stage arthritis, right hip(s)  The patient history, physical examination, clinical judgement of the provider and imaging studies are consistent with end stage degenerative joint disease of the right hip(s) and total hip arthroplasty is deemed medically necessary. The treatment options including medical management, injection therapy, arthroscopy  and arthroplasty were discussed at length. The risks and benefits of total hip arthroplasty were presented and reviewed. The risks due to aseptic loosening, infection, stiffness, dislocation/subluxation,  thromboembolic complications and other imponderables were discussed.  The patient acknowledged the explanation, agreed to proceed with the plan and consent was signed. Patient is being admitted for inpatient treatment for surgery, pain control, PT, OT, prophylactic antibiotics, VTE prophylaxis, progressive ambulation and ADL's and discharge planning.The patient is planning to be discharged  home.  Therapy Plans: HEP Disposition: Home with partner [name] Planned DVT Prophylaxis: aspirin 81mg  BID DME needed: walker PCP: NP, clearance received Urology: Dr. Abbey Chatters, clearance received (small renal mass,  chronic UTI, kidney stones) TXA: IV Allergies: codeine - itching, latex - skin irritation Anesthesia Concerns: none BMI: 35 Last HgbA1c: Not diabetic  Other: - Chronic venous insufficiency - saw Dr. Marlou Porch pre-op - chronic UTI - on Keflex daily - Nadolol for migraines - Will plan to inject both knees with cortisone - Unsure if her partner will be able to provide enough help - Wants a second day in the hospital   Myra Gianotti, PA-C Orthopedic Surgery [facility] [place] [phone]

## 2020-12-17 ENCOUNTER — Ambulatory Visit (HOSPITAL_COMMUNITY): Payer: Medicare Other | Admitting: Anesthesiology

## 2020-12-17 ENCOUNTER — Encounter (HOSPITAL_COMMUNITY): Payer: Self-pay | Admitting: Orthopedic Surgery

## 2020-12-17 ENCOUNTER — Ambulatory Visit (HOSPITAL_COMMUNITY): Payer: Medicare Other | Admitting: Physician Assistant

## 2020-12-17 ENCOUNTER — Ambulatory Visit (HOSPITAL_COMMUNITY): Payer: Medicare Other

## 2020-12-17 ENCOUNTER — Ambulatory Visit: Payer: Medicare Other

## 2020-12-17 ENCOUNTER — Observation Stay (HOSPITAL_COMMUNITY)
Admission: RE | Admit: 2020-12-17 | Discharge: 2020-12-24 | Disposition: A | Discharge status: Home / Self Care | Payer: Medicare Other | Attending: Orthopedic Surgery | Admitting: Orthopedic Surgery

## 2020-12-17 ENCOUNTER — Other Ambulatory Visit: Payer: Self-pay

## 2020-12-17 ENCOUNTER — Other Ambulatory Visit: Payer: Medicare Other

## 2020-12-17 ENCOUNTER — Observation Stay (HOSPITAL_COMMUNITY): Payer: Medicare Other

## 2020-12-17 ENCOUNTER — Encounter (HOSPITAL_COMMUNITY)
Admission: RE | Disposition: A | Discharge status: Home / Self Care | Payer: Self-pay | Source: Home / Self Care | Attending: Orthopedic Surgery

## 2020-12-17 DIAGNOSIS — J45909 Unspecified asthma, uncomplicated: Secondary | ICD-10-CM | POA: Diagnosis not present

## 2020-12-17 DIAGNOSIS — Z20822 Contact with and (suspected) exposure to covid-19: Secondary | ICD-10-CM | POA: Diagnosis not present

## 2020-12-17 DIAGNOSIS — Z96641 Presence of right artificial hip joint: Secondary | ICD-10-CM | POA: Diagnosis present

## 2020-12-17 DIAGNOSIS — M1611 Unilateral primary osteoarthritis, right hip: Principal | ICD-10-CM | POA: Insufficient documentation

## 2020-12-17 DIAGNOSIS — Z96649 Presence of unspecified artificial hip joint: Secondary | ICD-10-CM

## 2020-12-17 DIAGNOSIS — Z79899 Other long term (current) drug therapy: Secondary | ICD-10-CM | POA: Diagnosis not present

## 2020-12-17 DIAGNOSIS — Z419 Encounter for procedure for purposes other than remedying health state, unspecified: Secondary | ICD-10-CM

## 2020-12-17 HISTORY — PX: TOTAL HIP ARTHROPLASTY: SHX124

## 2020-12-17 LAB — ABO/RH: ABO/RH(D): A POS

## 2020-12-17 SURGERY — ARTHROPLASTY, HIP, TOTAL, ANTERIOR APPROACH
Anesthesia: Spinal | Site: Hip | Laterality: Right

## 2020-12-17 MED ORDER — METHOCARBAMOL 500 MG IVPB - SIMPLE MED
INTRAVENOUS | Status: AC
  Filled 2020-12-17: qty 50

## 2020-12-17 MED ORDER — BUPIVACAINE IN DEXTROSE 0.75-8.25 % IT SOLN
INTRATHECAL | Status: DC | PRN
  Administered 2020-12-17: 1.8 mL via INTRATHECAL

## 2020-12-17 MED ORDER — DOCUSATE SODIUM 100 MG PO CAPS
100.0000 mg | ORAL_CAPSULE | Freq: Two times a day (BID) | ORAL | Status: DC
  Administered 2020-12-17 – 2020-12-24 (×14): 100 mg via ORAL
  Filled 2020-12-17 (×14): qty 1

## 2020-12-17 MED ORDER — ONDANSETRON HCL 4 MG PO TABS: 4.0000 mg | ORAL_TABLET | Freq: Four times a day (QID) | ORAL | Status: DC | PRN

## 2020-12-17 MED ORDER — PHENOL 1.4 % MT LIQD
1.0000 | OROMUCOSAL | Status: DC | PRN
  Administered 2020-12-19: 1 via OROMUCOSAL

## 2020-12-17 MED ORDER — LIDOCAINE HCL (PF) 1 % IJ SOLN
INTRAMUSCULAR | Status: DC | PRN
  Administered 2020-12-17 (×2): 6 mL

## 2020-12-17 MED ORDER — TRANEXAMIC ACID-NACL 1000-0.7 MG/100ML-% IV SOLN
1000.0000 mg | INTRAVENOUS | Status: AC
  Administered 2020-12-17: 1000 mg via INTRAVENOUS
  Filled 2020-12-17: qty 100

## 2020-12-17 MED ORDER — CEFAZOLIN SODIUM-DEXTROSE 2-4 GM/100ML-% IV SOLN
2.0000 g | Freq: Four times a day (QID) | INTRAVENOUS | Status: AC
  Administered 2020-12-17 – 2020-12-18 (×2): 2 g via INTRAVENOUS
  Filled 2020-12-17 (×2): qty 100

## 2020-12-17 MED ORDER — CEFAZOLIN SODIUM-DEXTROSE 2-4 GM/100ML-% IV SOLN
2.0000 g | INTRAVENOUS | Status: AC
  Administered 2020-12-17: 2 g via INTRAVENOUS
  Filled 2020-12-17: qty 100

## 2020-12-17 MED ORDER — METHOCARBAMOL 500 MG PO TABS
500.0000 mg | ORAL_TABLET | Freq: Four times a day (QID) | ORAL | Status: DC | PRN
  Administered 2020-12-17 – 2020-12-21 (×3): 500 mg via ORAL
  Filled 2020-12-17 (×3): qty 1

## 2020-12-17 MED ORDER — METOCLOPRAMIDE HCL 5 MG/ML IJ SOLN: 5.0000 mg | Freq: Three times a day (TID) | INTRAMUSCULAR | Status: DC | PRN

## 2020-12-17 MED ORDER — BUPIVACAINE-EPINEPHRINE (PF) 0.25% -1:200000 IJ SOLN
INTRAMUSCULAR | Status: AC
  Filled 2020-12-17: qty 30

## 2020-12-17 MED ORDER — METHOCARBAMOL 500 MG IVPB - SIMPLE MED
500.0000 mg | Freq: Four times a day (QID) | INTRAVENOUS | Status: DC | PRN
  Administered 2020-12-17: 500 mg via INTRAVENOUS
  Filled 2020-12-17: qty 50

## 2020-12-17 MED ORDER — METHYLPREDNISOLONE ACETATE 40 MG/ML IJ SUSP
INTRAMUSCULAR | Status: DC | PRN
  Administered 2020-12-17 (×2): 2 mL

## 2020-12-17 MED ORDER — SODIUM CHLORIDE 0.9 % IR SOLN
Status: DC | PRN
  Administered 2020-12-17: 1000 mL

## 2020-12-17 MED ORDER — PHENYLEPHRINE HCL-NACL 20-0.9 MG/250ML-% IV SOLN
INTRAVENOUS | Status: DC | PRN
  Administered 2020-12-17: 15 ug/min via INTRAVENOUS

## 2020-12-17 MED ORDER — ROSUVASTATIN CALCIUM 5 MG PO TABS
5.0000 mg | ORAL_TABLET | Freq: Every day | ORAL | Status: DC
  Administered 2020-12-17 – 2020-12-23 (×7): 5 mg via ORAL
  Filled 2020-12-17 (×7): qty 1

## 2020-12-17 MED ORDER — TRAMADOL HCL 50 MG PO TABS: 50.0000 mg | ORAL_TABLET | Freq: Four times a day (QID) | ORAL | Status: DC | PRN

## 2020-12-17 MED ORDER — CEPHALEXIN 500 MG PO CAPS
500.0000 mg | ORAL_CAPSULE | Freq: Every day | ORAL | Status: DC
  Administered 2020-12-17: 500 mg via ORAL
  Filled 2020-12-17: qty 1

## 2020-12-17 MED ORDER — FENTANYL CITRATE PF 50 MCG/ML IJ SOSY: 50.0000 ug | PREFILLED_SYRINGE | INTRAMUSCULAR | Status: DC

## 2020-12-17 MED ORDER — FERROUS SULFATE 325 (65 FE) MG PO TABS
325.0000 mg | ORAL_TABLET | Freq: Three times a day (TID) | ORAL | Status: DC
  Administered 2020-12-18 – 2020-12-24 (×18): 325 mg via ORAL
  Filled 2020-12-17 (×19): qty 1

## 2020-12-17 MED ORDER — MORPHINE SULFATE (PF) 4 MG/ML IV SOLN: 0.5000 mg | INTRAVENOUS | Status: DC | PRN

## 2020-12-17 MED ORDER — LACTATED RINGERS IV SOLN: INTRAVENOUS | Status: DC

## 2020-12-17 MED ORDER — ACETAMINOPHEN 325 MG PO TABS
325.0000 mg | ORAL_TABLET | Freq: Four times a day (QID) | ORAL | Status: DC | PRN
  Administered 2020-12-22: 650 mg via ORAL
  Filled 2020-12-17: qty 2

## 2020-12-17 MED ORDER — DEXAMETHASONE SODIUM PHOSPHATE 10 MG/ML IJ SOLN
INTRAMUSCULAR | Status: DC | PRN
Start: 2020-12-17 — End: 2020-12-17
  Administered 2020-12-17: 4 mg via INTRAVENOUS

## 2020-12-17 MED ORDER — FENTANYL CITRATE PF 50 MCG/ML IJ SOSY
PREFILLED_SYRINGE | INTRAMUSCULAR | Status: AC
  Filled 2020-12-17: qty 1

## 2020-12-17 MED ORDER — SODIUM CHLORIDE (PF) 0.9 % IJ SOLN
INTRAMUSCULAR | Status: AC
  Filled 2020-12-17: qty 30

## 2020-12-17 MED ORDER — FENTANYL CITRATE PF 50 MCG/ML IJ SOSY
PREFILLED_SYRINGE | INTRAMUSCULAR | Status: AC
  Filled 2020-12-17: qty 2

## 2020-12-17 MED ORDER — ONDANSETRON HCL 4 MG/2ML IJ SOLN: 4.0000 mg | Freq: Four times a day (QID) | INTRAMUSCULAR | Status: DC | PRN

## 2020-12-17 MED ORDER — PROPOFOL 500 MG/50ML IV EMUL
INTRAVENOUS | Status: DC | PRN
  Administered 2020-12-17: 75 ug/kg/min via INTRAVENOUS

## 2020-12-17 MED ORDER — ASPIRIN 81 MG PO CHEW
81.0000 mg | CHEWABLE_TABLET | Freq: Two times a day (BID) | ORAL | Status: DC
  Administered 2020-12-17 – 2020-12-24 (×14): 81 mg via ORAL
  Filled 2020-12-17 (×14): qty 1

## 2020-12-17 MED ORDER — NADOLOL 40 MG PO TABS
40.0000 mg | ORAL_TABLET | Freq: Every day | ORAL | Status: DC
  Administered 2020-12-17 – 2020-12-23 (×7): 40 mg via ORAL
  Filled 2020-12-17 (×7): qty 1

## 2020-12-17 MED ORDER — METHYLPREDNISOLONE ACETATE 40 MG/ML IJ SUSP
INTRAMUSCULAR | Status: AC
  Filled 2020-12-17: qty 2

## 2020-12-17 MED ORDER — DEXAMETHASONE SODIUM PHOSPHATE 10 MG/ML IJ SOLN
10.0000 mg | Freq: Once | INTRAMUSCULAR | Status: AC
  Administered 2020-12-18: 10 mg via INTRAVENOUS
  Filled 2020-12-17: qty 1

## 2020-12-17 MED ORDER — HYDROCODONE-ACETAMINOPHEN 5-325 MG PO TABS
1.0000 | ORAL_TABLET | ORAL | Status: DC | PRN
  Administered 2020-12-17: 2 via ORAL
  Administered 2020-12-17 – 2020-12-24 (×15): 1 via ORAL
  Filled 2020-12-17 (×6): qty 1
  Filled 2020-12-17: qty 2
  Filled 2020-12-17 (×10): qty 1

## 2020-12-17 MED ORDER — SODIUM CHLORIDE 0.9 % IV SOLN: INTRAVENOUS | Status: DC

## 2020-12-17 MED ORDER — BISACODYL 10 MG RE SUPP: 10.0000 mg | Freq: Every day | RECTAL | Status: DC | PRN

## 2020-12-17 MED ORDER — TRANEXAMIC ACID-NACL 1000-0.7 MG/100ML-% IV SOLN
1000.0000 mg | Freq: Once | INTRAVENOUS | Status: AC
  Administered 2020-12-17: 1000 mg via INTRAVENOUS
  Filled 2020-12-17: qty 100

## 2020-12-17 MED ORDER — DIPHENHYDRAMINE HCL 12.5 MG/5ML PO ELIX
12.5000 mg | ORAL_SOLUTION | ORAL | Status: DC | PRN
Start: 2020-12-17 — End: 2020-12-24

## 2020-12-17 MED ORDER — MENTHOL 3 MG MT LOZG
1.0000 | LOZENGE | OROMUCOSAL | Status: DC | PRN
  Filled 2020-12-17: qty 9

## 2020-12-17 MED ORDER — KETOROLAC TROMETHAMINE 30 MG/ML IJ SOLN
INTRAMUSCULAR | Status: AC
  Filled 2020-12-17: qty 1

## 2020-12-17 MED ORDER — PROPOFOL 10 MG/ML IV BOLUS
INTRAVENOUS | Status: DC | PRN
  Administered 2020-12-17 (×3): 10 mg via INTRAVENOUS

## 2020-12-17 MED ORDER — LIDOCAINE HCL (PF) 2 % IJ SOLN
INTRAMUSCULAR | Status: AC
  Filled 2020-12-17: qty 10

## 2020-12-17 MED ORDER — METOCLOPRAMIDE HCL 5 MG PO TABS: 5.0000 mg | ORAL_TABLET | Freq: Three times a day (TID) | ORAL | Status: DC | PRN

## 2020-12-17 MED ORDER — CHLORHEXIDINE GLUCONATE 0.12 % MT SOLN
15.0000 mL | Freq: Once | OROMUCOSAL | Status: AC
  Administered 2020-12-17: 15 mL via OROMUCOSAL

## 2020-12-17 MED ORDER — LIDOCAINE-EPINEPHRINE (PF) 1 %-1:200000 IJ SOLN
INTRAMUSCULAR | Status: AC
  Filled 2020-12-17: qty 30

## 2020-12-17 MED ORDER — LIDOCAINE 2% (20 MG/ML) 5 ML SYRINGE
INTRAMUSCULAR | Status: DC | PRN
  Administered 2020-12-17: 80 mg via INTRAVENOUS

## 2020-12-17 MED ORDER — ORAL CARE MOUTH RINSE: 15.0000 mL | Freq: Once | OROMUCOSAL | Status: AC

## 2020-12-17 MED ORDER — POLYETHYLENE GLYCOL 3350 17 G PO PACK
17.0000 g | PACK | Freq: Every day | ORAL | Status: DC | PRN
  Filled 2020-12-17: qty 1

## 2020-12-17 MED ORDER — ONDANSETRON HCL 4 MG/2ML IJ SOLN
INTRAMUSCULAR | Status: DC | PRN
  Administered 2020-12-17: 4 mg via INTRAVENOUS

## 2020-12-17 MED ORDER — POVIDONE-IODINE 10 % EX SWAB
2.0000 "application " | Freq: Once | CUTANEOUS | Status: AC
  Administered 2020-12-17: 2 via TOPICAL

## 2020-12-17 MED ORDER — STERILE WATER FOR IRRIGATION IR SOLN
Status: DC | PRN
  Administered 2020-12-17: 2000 mL

## 2020-12-17 MED ORDER — DEXAMETHASONE SODIUM PHOSPHATE 10 MG/ML IJ SOLN: 8.0000 mg | Freq: Once | INTRAMUSCULAR | Status: DC

## 2020-12-17 MED ORDER — FENTANYL CITRATE PF 50 MCG/ML IJ SOSY
25.0000 ug | PREFILLED_SYRINGE | INTRAMUSCULAR | Status: DC | PRN
  Administered 2020-12-17 (×3): 50 ug via INTRAVENOUS

## 2020-12-17 SURGICAL SUPPLY — 44 items
ADH SKN CLS APL DERMABOND .7 (GAUZE/BANDAGES/DRESSINGS) ×1
BAG COUNTER SPONGE SURGICOUNT (BAG) ×1 IMPLANT
BAG DECANTER FOR FLEXI CONT (MISCELLANEOUS) IMPLANT
BAG SPEC THK2 15X12 ZIP CLS (MISCELLANEOUS)
BAG SPNG CNTER NS LX DISP (BAG) ×1
BAG ZIPLOCK 12X15 (MISCELLANEOUS) IMPLANT
BLADE SAG 18X100X1.27 (BLADE) ×2 IMPLANT
COVER PERINEAL POST (MISCELLANEOUS) ×2 IMPLANT
COVER SURGICAL LIGHT HANDLE (MISCELLANEOUS) ×2 IMPLANT
CUP ACETBLR 52 OD PINNACLE (Hips) ×1 IMPLANT
DERMABOND ADVANCED (GAUZE/BANDAGES/DRESSINGS) ×1
DERMABOND ADVANCED .7 DNX12 (GAUZE/BANDAGES/DRESSINGS) ×1 IMPLANT
DRAPE FOOT SWITCH (DRAPES) ×2 IMPLANT
DRAPE STERI IOBAN 125X83 (DRAPES) ×2 IMPLANT
DRAPE U-SHAPE 47X51 STRL (DRAPES) ×4 IMPLANT
DRESSING AQUACEL AG SP 3.5X10 (GAUZE/BANDAGES/DRESSINGS) ×1 IMPLANT
DRSG AQUACEL AG SP 3.5X10 (GAUZE/BANDAGES/DRESSINGS) ×2
DURAPREP 26ML APPLICATOR (WOUND CARE) ×2 IMPLANT
ELECT REM PT RETURN 15FT ADLT (MISCELLANEOUS) ×2 IMPLANT
ELIMINATOR HOLE APEX DEPUY (Hips) ×1 IMPLANT
GLOVE SURG ENC MOIS LTX SZ6 (GLOVE) ×4 IMPLANT
GLOVE SURG ENC MOIS LTX SZ7 (GLOVE) ×2 IMPLANT
GLOVE SURG POLY ORTHO LF SZ6.5 (GLOVE) ×2 IMPLANT
GLOVE SURG UNDER POLY LF SZ7.5 (GLOVE) ×2 IMPLANT
GOWN STRL REUS W/TWL LRG LVL3 (GOWN DISPOSABLE) ×4 IMPLANT
HEAD M SROM 36MM PLUS 1.5 (Hips) IMPLANT
HOLDER FOLEY CATH W/STRAP (MISCELLANEOUS) ×2 IMPLANT
KIT TURNOVER KIT A (KITS) ×2 IMPLANT
LINER NEUTRAL 52X36MM PLUS 4 (Liner) ×1 IMPLANT
PACK ANTERIOR HIP CUSTOM (KITS) ×2 IMPLANT
PENCIL SMOKE EVACUATOR (MISCELLANEOUS) IMPLANT
SCREW 6.5MMX30MM (Screw) ×1 IMPLANT
SPONGE T-LAP 18X18 ~~LOC~~+RFID (SPONGE) ×2 IMPLANT
SROM M HEAD 36MM PLUS 1.5 (Hips) ×2 IMPLANT
STEM FEM ACTIS STD SZ4 (Stem) ×1 IMPLANT
SUT MNCRL AB 4-0 PS2 18 (SUTURE) ×2 IMPLANT
SUT STRATAFIX 0 PDS 27 VIOLET (SUTURE) ×2
SUT VIC AB 1 CT1 36 (SUTURE) ×6 IMPLANT
SUT VIC AB 2-0 CT1 27 (SUTURE) ×4
SUT VIC AB 2-0 CT1 TAPERPNT 27 (SUTURE) ×2 IMPLANT
SUTURE STRATFX 0 PDS 27 VIOLET (SUTURE) ×1 IMPLANT
TRAY FOLEY MTR SLVR 14FR STAT (SET/KITS/TRAYS/PACK) ×1 IMPLANT
TUBE SUCTION HIGH CAP CLEAR NV (SUCTIONS) ×2 IMPLANT
WATER STERILE IRR 1000ML POUR (IV SOLUTION) ×2 IMPLANT

## 2020-12-17 NOTE — Anesthesia Postprocedure Evaluation (Signed)
Anesthesia Post Note  Patient: Susan Boone  Procedure(s) Performed: TOTAL HIP ARTHROPLASTY ANTERIOR APPROACH (Right: Hip)     Patient location during evaluation: PACU Anesthesia Type: Spinal Level of consciousness: awake Pain management: pain level controlled Vital Signs Assessment: post-procedure vital signs reviewed and stable Respiratory status: spontaneous breathing Cardiovascular status: stable Postop Assessment: no apparent nausea or vomiting Anesthetic complications: no   No notable events documented.  Last Vitals:  Vitals:   12/17/20 1044 12/17/20 1430  BP: 137/83   Pulse: 75   Resp: 18 (!) 1  Temp: 37.1 C   SpO2: 98%     Last Pain:  Vitals:   12/17/20 1051  TempSrc:   PainSc: 0-No pain                 Ester Hilley

## 2020-12-17 NOTE — Op Note (Signed)
NAME:  Susan Boone                ACCOUNT NO.: [id number]      MEDICAL RECORD NO.: [id number]      FACILITY:  [facility]      PHYSICIAN:  Shelda Pal  DATE OF BIRTH:  25-Jul-1934     DATE OF PROCEDURE:  12/17/2020                                 OPERATIVE REPORT         PREOPERATIVE DIAGNOSIS: Right  hip osteoarthritis.      POSTOPERATIVE DIAGNOSIS:  Right hip osteoarthritis.      PROCEDURE:  Right total hip replacement through an anterior approach   utilizing DePuy THR system, component size 52 mm pinnacle cup, a size 36+4 neutral   Altrex liner, a size 4 standard Actis stem with a 36+1.5 Articuleze metal head ball.      SURGEON:  Madlyn Frankel. Charlann Boxer, M.D.      ASSISTANT:  Rosalene Billings, PA-C     ANESTHESIA:  Spinal.      SPECIMENS:  None.      COMPLICATIONS:  None.      BLOOD LOSS:  550 cc     DRAINS:  None.      INDICATION OF THE PROCEDURE:  Susan Boone is a 86 y.o. female who had   presented to office for evaluation of right hip pain.  Radiographs revealed   progressive degenerative changes with bone-on-bone   articulation of the  hip joint, including subchondral cystic changes and osteophytes.  The patient had painful limited range of   motion significantly affecting their overall quality of life and function.  The patient was failing to    respond to conservative measures including medications and/or injections and activity modification and at this point was ready   to proceed with more definitive measures.  Consent was obtained for   benefit of pain relief.  Specific risks of infection, DVT, component   failure, dislocation, neurovascular injury, and need for revision surgery were reviewed in the office as well discussion of   the anterior versus posterior approach were reviewed.     PROCEDURE IN DETAIL:  The patient was brought to operative theater.   Once adequate anesthesia, preoperative antibiotics, 2 gm of Ancef, 1 gm of  Tranexamic Acid, and 10 mg of Decadron were administered, the patient was positioned supine on the [employer] table.  Once the patient was safely positioned with adequate padding of boney prominences we predraped out the hip, and used fluoroscopy to confirm orientation of the pelvis.      The right hip was then prepped and draped from proximal iliac crest to   mid thigh with a shower curtain technique.      Time-out was performed identifying the patient, planned procedure, and the appropriate extremity.     An incision was then made 2 cm lateral to the   anterior superior iliac spine extending over the orientation of the   tensor fascia lata muscle and sharp dissection was carried down to the   fascia of the muscle.      The fascia was then incised.  The muscle belly was identified and swept   laterally and retractor placed along the superior neck.  Following   cauterization of the circumflex vessels and removing some pericapsular  fat, a second cobra retractor was placed on the inferior neck.  A T-capsulotomy was made along the line of the   superior neck to the trochanteric fossa, then extended proximally and   distally.  Tag sutures were placed and the retractors were then placed   intracapsular.  We then identified the trochanteric fossa and   orientation of my neck cut and then made a neck osteotomy with the femur on traction.  The femoral   head was removed without difficulty or complication.  Traction was let   off and retractors were placed posterior and anterior around the   acetabulum.      The labrum and foveal tissue were debrided.  I began reaming with a 45 mm   reamer and reamed up to 51 mm reamer with good bony bed preparation and a 52 mm  cup was chosen.  The final 52 mm Pinnacle cup was then impacted under fluoroscopy to confirm the depth of penetration and orientation with respect to   Abduction and forward flexion.  A screw was placed into the ilium followed by the hole  eliminator.  The final   36+4 neutral Altrex liner was impacted with good visualized rim fit.  The cup was positioned anatomically within the acetabular portion of the pelvis.      At this point, the femur was rolled to 100 degrees.  Further capsule was   released off the inferior aspect of the femoral neck.  I then   released the superior capsule proximally.  With the leg in a neutral position the hook was placed laterally   along the femur under the vastus lateralis origin and elevated manually and then held in position using the hook attachment on the bed.  The leg was then extended and adducted with the leg rolled to 100   degrees of external rotation.  Retractors were placed along the medial calcar and posteriorly over the greater trochanter.  Once the proximal femur was fully   exposed, I used a box osteotome to set orientation.  I then began   broaching with the starting chili pepper broach and passed this by hand and then broached up to 4.  With the 4 broach in place I chose a standard offset neck and did several trial reductions.  The offset was appropriate, leg lengths   appeared to be equal best matched with the +1.5 head ball trial confirmed radiographically.   Given these findings, I went ahead and dislocated the hip, repositioned all   retractors and positioned the right hip in the extended and abducted position.  The final 4 standard Actis stem was   chosen and it was impacted down to the level of neck cut.  Based on this   and the trial reductions, a final 36+1.5 Articuleze metal head ball was chosen and   impacted onto a clean and dry trunnion, and the hip was reduced.  The   hip had been irrigated throughout the case again at this point.  I did   reapproximate the superior capsular leaflet to the anterior leaflet   using #1 Vicryl.  The fascia of the   tensor fascia lata muscle was then reapproximated using #1 Vicryl and #0 Stratafix sutures.  The   remaining wound was closed  with 2-0 Vicryl and running 4-0 Monocryl.   The hip was cleaned, dried, and dressed sterilely using Dermabond and   Aquacel dressing.  The patient was then brought   to recovery room  in stable condition tolerating the procedure well.    Rosalene Billings, PA-C was present for the entirety of the case involved from   preoperative positioning, perioperative retractor management, general   facilitation of the case, as well as primary wound closure as assistant.            Madlyn Frankel Charlann Boxer, M.D.        12/17/2020 12:40 PM

## 2020-12-17 NOTE — Discharge Instructions (Signed)

## 2020-12-17 NOTE — Plan of Care (Signed)
  Problem: Clinical Measurements: Goal: Ability to maintain clinical measurements within normal limits will improve Outcome: Progressing   Problem: Pain Managment: Goal: General experience of comfort will improve Outcome: Progressing   Problem: Activity: Goal: Ability to avoid complications of mobility impairment will improve Outcome: Progressing   

## 2020-12-17 NOTE — Interval H&P Note (Signed)
History and Physical Interval Note:  12/17/2020 11:15 AM  Susan Boone  has presented today for surgery, with the diagnosis of Right hip osteoarthritis.  The various methods of treatment have been discussed with the patient and family. After consideration of risks, benefits and other options for treatment, the patient has consented to  Procedure(s): TOTAL HIP ARTHROPLASTY ANTERIOR APPROACH (Right) as a surgical intervention.  The patient's history has been reviewed, patient examined, no change in status, stable for surgery.  I have reviewed the patient's chart and labs.  Questions were answered to the patient's satisfaction.     Shelda Pal

## 2020-12-17 NOTE — Anesthesia Procedure Notes (Signed)
Spinal  Patient location during procedure: OR Start time: 12/17/2020 12:45 PM End time: 12/17/2020 1:00 PM Reason for block: surgical anesthesia Staffing Performed: resident/CRNA  Anesthesiologist: Belinda Block, MD Resident/CRNA: Lavina Hamman, CRNA Preanesthetic Checklist Completed: patient identified, IV checked, site marked, risks and benefits discussed, surgical consent, monitors and equipment checked, pre-op evaluation and timeout performed Spinal Block Patient position: sitting Prep: DuraPrep Patient monitoring: heart rate, cardiac monitor, continuous pulse ox and blood pressure Approach: midline Location: L3-4 Injection technique: single-shot Needle Needle type: Sprotte  Needle gauge: 24 G Needle length: 9 cm Needle insertion depth: 7 cm Assessment Sensory level: T4 Events: CSF return Additional Notes IV functioning, monitors applied to pt. Expiration date of kit checked and confirmed to be in date. Sterile prep and drape, hand hygiene and sterile gloved used. Pt was positioned and spine was prepped in sterile fashion. Skin was anesthetized with lidocaine. Free flow of clear CSF obtained prior to injecting local anesthetic into CSF x 1 attempt. Spinal needle aspirated freely following injection. Needle was carefully withdrawn, and pt tolerated procedure well. Loss of motor and sensory on exam post injection.

## 2020-12-17 NOTE — Transfer of Care (Signed)
Immediate Anesthesia Transfer of Care Note  Patient: Susan Boone  Procedure(s) Performed: Procedure(s): TOTAL HIP ARTHROPLASTY ANTERIOR APPROACH (Right)  Patient Location: PACU  Anesthesia Type:Spinal  Level of Consciousness:  sedated, patient cooperative and responds to stimulation  Airway & Oxygen Therapy:Patient Spontanous Breathing and Patient connected to face mask oxgen  Post-op Assessment:  Report given to PACU RN and Post -op Vital signs reviewed and stable  Post vital signs:  Reviewed and stable  Last Vitals:  Vitals:   12/17/20 1044  BP: 137/83  Pulse: 75  Resp: 18  Temp: 37.1 C  SpO2: 98%    Complications: No apparent anesthesia complications

## 2020-12-18 ENCOUNTER — Encounter (HOSPITAL_COMMUNITY): Payer: Self-pay | Admitting: Orthopedic Surgery

## 2020-12-18 DIAGNOSIS — M1611 Unilateral primary osteoarthritis, right hip: Secondary | ICD-10-CM | POA: Diagnosis not present

## 2020-12-18 LAB — BASIC METABOLIC PANEL
Anion gap: 7 (ref 5–15)
BUN: 13 mg/dL (ref 8–23)
CO2: 25 mmol/L (ref 22–32)
Calcium: 8.2 mg/dL — ABNORMAL LOW (ref 8.9–10.3)
Chloride: 106 mmol/L (ref 98–111)
Creatinine, Ser: 0.67 mg/dL (ref 0.44–1.00)
GFR, Estimated: >60 mL/min (ref >60)
Glucose, Bld: 187 mg/dL — ABNORMAL HIGH (ref 70–99)
Potassium: 3.9 mmol/L (ref 3.5–5.1)
Sodium: 138 mmol/L (ref 135–145)

## 2020-12-18 LAB — CBC
HCT: 34.6 % — ABNORMAL LOW (ref 36.0–46.0)
Hemoglobin: 11 g/dL — ABNORMAL LOW (ref 12.0–15.0)
MCH: 28.2 pg (ref 26.0–34.0)
MCHC: 31.8 g/dL (ref 30.0–36.0)
MCV: 88.7 fL (ref 80.0–100.0)
Platelets: 122 10*3/uL — ABNORMAL LOW (ref 150–400)
RBC: 3.9 MIL/uL (ref 3.87–5.11)
RDW: 14.7 % (ref 11.5–15.5)
WBC: 8.5 10*3/uL (ref 4.0–10.5)
nRBC: 0 % (ref 0.0–0.2)

## 2020-12-18 MED ORDER — CEPHALEXIN 500 MG PO CAPS
500.0000 mg | ORAL_CAPSULE | Freq: Four times a day (QID) | ORAL | Status: DC
  Administered 2020-12-18 – 2020-12-24 (×24): 500 mg via ORAL
  Filled 2020-12-18 (×24): qty 1

## 2020-12-18 MED ORDER — CHLORHEXIDINE GLUCONATE CLOTH 2 % EX PADS
6.0000 | MEDICATED_PAD | Freq: Every day | CUTANEOUS | Status: DC
  Administered 2020-12-18: 6 via TOPICAL

## 2020-12-18 NOTE — Evaluation (Signed)
Physical Therapy Evaluation Patient Details Name: Susan Boone MRN: [id number] DOB: [birth year] Today's Date: 12/18/2020  History of Present Illness  Pt is an 86 year old female s/p Rt DA THA.  PMHx: venous insufficiency  Clinical Impression  Pt is s/p THA resulting in the deficits listed below (see PT Problem List). Pt will benefit from skilled PT to increase their independence and safety with mobility to allow discharge to the venue listed below.  Pt assisted with standing and transfer to recliner this morning POD #1.  Pt reports Jaquelyn Bitter will be home with her however she will need to be fairly independent upon d/c.  Pt plans to sleep on couch and "bird bath" for a little however does have tub bench.  Pt also reports she purchased an ADL kit and received just prior to admission.  Pt will need to practice steps prior to d/c home.      Recommendations for follow up therapy are one component of a multi-disciplinary discharge planning process, led by the attending physician.  Recommendations may be updated based on patient status, additional functional criteria and insurance authorization.  Follow Up Recommendations Follow surgeon's recommendation for DC plan and follow-up therapies;Home health PT    Equipment Recommendations  Rolling walker with 5" wheels    Recommendations for Other Services       Precautions / Restrictions Precautions Precautions: Fall Restrictions Weight Bearing Restrictions: No Other Position/Activity Restrictions: WBAT      Mobility  Bed Mobility Overal bed mobility: Needs Assistance Bed Mobility: Supine to Sit     Supine to sit: Min guard;HOB elevated     General bed mobility comments: verbal cues for self assist    Transfers Overall transfer level: Needs assistance Equipment used: Rolling walker (2 wheeled) Transfers: Sit to/from [facility] Sit to Stand: Mod assist Stand pivot transfers: Min assist       General transfer  comment: verbal cues for UE and LE positioning, pt with difficulty moving Rt LE and preferred to transfer to recliner first time OOB, cues for technique  Ambulation/Gait                Stairs            Wheelchair Mobility    Modified Rankin (Stroke Patients Only)       Balance                                             Pertinent Vitals/Pain Pain Assessment: 0-10 Pain Score: 7  Pain Location: right hip Pain Descriptors / Indicators: Sore;Aching Pain Intervention(s): Repositioned;Monitored during session;Patient requesting pain meds-RN notified    Home Living Family/patient expects to be discharged to:: Private residence Living Arrangements: Spouse/significant other   Type of Home: House Home Access: Stairs to enter Entrance Stairs-Rails: Right Entrance Stairs-Number of Steps: 3-4 +1 Home Layout: One [place] - single point;Tub bench      Prior Function Level of Independence: Independent with assistive device(s)               Hand Dominance        Extremity/Trunk Assessment        Lower Extremity Assessment Lower Extremity Assessment: RLE deficits/detail RLE Deficits / Details: anticipated post op hip weakness, able to perform ankle pumps, fair quad contraction       Communication   Communication:  No difficulties  Cognition Arousal/Alertness: Awake/alert Behavior During Therapy: WFL for tasks assessed/performed Overall Cognitive Status: Within Functional Limits for tasks assessed                                        General Comments      Exercises Total Joint Exercises Ankle Circles/Pumps: AROM;Both;10 reps Quad Sets: AROM;Right;10 reps Heel Slides: AAROM;Right;10 reps Hip ABduction/ADduction: AAROM;Right;10 reps   Assessment/Plan    PT Assessment Patient needs continued PT services  PT Problem List Decreased strength;Decreased mobility;Decreased activity  tolerance;Decreased balance;Decreased knowledge of use of DME;Pain;Decreased range of motion       PT Treatment Interventions Stair training;Gait training;DME instruction;Therapeutic activities;Patient/family education;Functional mobility training;Therapeutic exercise;Balance training    PT Goals (Current goals can be found in the Care Plan section)  Acute Rehab PT Goals PT Goal Formulation: With patient Time For Goal Achievement: 12/25/20 Potential to Achieve Goals: Good    Frequency 7X/week   Barriers to discharge Decreased caregiver support      Co-evaluation               AM-PAC PT "6 Clicks" Mobility  Outcome Measure Help needed turning from your back to your side while in a flat bed without using bedrails?: A Little Help needed moving from lying on your back to sitting on the side of a flat bed without using bedrails?: A Little Help needed moving to and from a bed to a chair (including a wheelchair)?: A Little Help needed standing up from a chair using your arms (e.g., wheelchair or bedside chair)?: A Lot Help needed to walk in hospital room?: A Lot Help needed climbing 3-5 steps with a railing? : Total 6 Click Score: 14    End of Session Equipment Utilized During Treatment: Gait belt Activity Tolerance: Patient tolerated treatment well Patient left: in chair;with call bell/phone within reach;with chair alarm set   PT Visit Diagnosis: Other abnormalities of gait and mobility (R26.89)    Time: 1020-1049 PT Time Calculation (min) (ACUTE ONLY): 29 min   Charges:   PT Evaluation $PT Eval Low Complexity: 1 Low PT Treatments $Therapeutic Exercise: 8-22 mins      Jannette Spanner PT, DPT Acute Rehabilitation Services Pager: [phone] Office: [place] 12/18/2020, 12:48 PM

## 2020-12-18 NOTE — Progress Notes (Signed)
Physical Therapy Treatment Patient Details Name: Susan Boone MRN: [id number] DOB: 12/10/1934 Today's Date: 12/18/2020   History of Present Illness Pt is an 86 year old female s/p Rt DA THA.  PMHx: venous insufficiency    PT Comments    Pt assisted with changing depends and utilized her reacher from home for self assisting.  Pt also with darker skin discoloration possibly from moisture on posterior thighs under gluteal folds so cleansed and applied barrier cream.  Pt assisted with performing short distance ambulation however fatigues quickly.       Recommendations for follow up therapy are one component of a multi-disciplinary discharge planning process, led by the attending physician.  Recommendations may be updated based on patient status, additional functional criteria and insurance authorization.  Follow Up Recommendations  Follow surgeon's recommendation for DC plan and follow-up therapies;Home health PT     Equipment Recommendations  Rolling walker with 5" wheels    Recommendations for Other Services       Precautions / Restrictions Precautions Precautions: Fall Restrictions Weight Bearing Restrictions: No Other Position/Activity Restrictions: WBAT     Mobility  Bed Mobility Overal bed mobility: Needs Assistance Bed Mobility: Supine to Sit;Sit to Supine     Supine to sit: Min guard;HOB elevated Sit to supine: Min guard;HOB elevated   General bed mobility comments: verbal cues for self assist, pt utilizing bed rails to assist    Transfers Overall transfer level: Needs assistance Equipment used: Rolling walker (2 wheeled) Transfers: Sit to/from Stand Sit to Stand: Min assist Stand pivot transfers: Min assist       General transfer comment: verbal cues for UE and LE positioning, assist for rise and steady  Ambulation/Gait Ambulation/Gait assistance: Min assist;Min guard Gait Distance (Feet): 24 Feet Assistive device: Rolling walker (2 wheeled) Gait  Pattern/deviations: Step-to pattern;Decreased stance time - right;Antalgic     General Gait Details: verbal cues for sequence, RW positioning, posture   Stairs             Wheelchair Mobility    Modified Rankin (Stroke Patients Only)       Balance                                            Cognition Arousal/Alertness: Awake/alert Behavior During Therapy: WFL for tasks assessed/performed Overall Cognitive Status: Within Functional Limits for tasks assessed                                        Exercises   General Comments        Pertinent Vitals/Pain Pain Assessment: 0-10 Pain Score: 5  Pain Location: right hip Pain Descriptors / Indicators: Sore;Aching Pain Intervention(s): Repositioned;Monitored during session    Home Living                      Prior Function            PT Goals (current goals can now be found in the care plan section) Acute Rehab PT Goals PT Goal Formulation: With patient Time For Goal Achievement: 12/25/20 Potential to Achieve Goals: Good Progress towards PT goals: Progressing toward goals    Frequency    7X/week      PT Plan Current plan remains appropriate  Co-evaluation              AM-PAC PT "6 Clicks" Mobility   Outcome Measure  Help needed turning from your back to your side while in a flat bed without using bedrails?: A Little Help needed moving from lying on your back to sitting on the side of a flat bed without using bedrails?: A Little Help needed moving to and from a bed to a chair (including a wheelchair)?: A Little Help needed standing up from a chair using your arms (e.g., wheelchair or bedside chair)?: A Little Help needed to walk in hospital room?: A Lot Help needed climbing 3-5 steps with a railing? : Total 6 Click Score: 15    End of Session Equipment Utilized During Treatment: Gait belt Activity Tolerance: Patient tolerated treatment  well Patient left: in bed;with call bell/phone within reach;with bed alarm set   PT Visit Diagnosis: Other abnormalities of gait and mobility (R26.89)     Time: 1020-1049 PT Time Calculation (min) (ACUTE ONLY): 29 min  Charges:  $Gait Training: 8-22 mins $Therapeutic Activity: 8-22 mins                    Thomasene Mohair PT, DPT Acute Rehabilitation Services Pager: [phone] Office: [phone]    [name] 12/18/2020, 4:20 PM

## 2020-12-18 NOTE — TOC Transition Note (Signed)
Transition of Care [facility]) - CM/SW Discharge Note  Patient Details  Name: Susan Boone MRN: [id number] Date of Birth: 05-13-1934  Transition of Care [facility]) CM/SW Contact:  Sherie Don, LCSW Phone Number: 12/18/2020, 12:30 PM  Clinical Narrative: Patient is expected to discharge home after working with PT. CSW met with patient to discuss discharge needs. Patient will discharge home with a home exercise program (HEP). Patient has a rollator, elevated toilets, grab bars, and 3N1 at home, but will need a regular rolling walker. [employer] to deliver walker to patient's room.  Patient requested to speak to [place] again and asked about a hospital bed and/or bed rails. CSW explained patient would need to have a room large enough to accommodate the hospital bed and it will need to be cleared out. Patient reported she could not do that to fit a hospital bed in the house. Patient asked about bed rails. CSW explained patient would need to private pay for bed rails as only a hospital bed is covered by Medicare. TOC signing off.  Final next level of care: Home/Self Care Barriers to Discharge: No Barriers Identified  Patient Goals and CMS Choice Patient states their goals for this hospitalization and ongoing recovery are:: Discharge home with HEP CMS Medicare.gov Compare Post Acute Care list provided to:: Patient Choice offered to / list presented to : Patient  Discharge Plan and Services         DME Arranged: Walker rolling DME Agency: [employer] Representative spoke with at DME Agency: Prearranged in orthopedist's office  Readmission Risk Interventions No flowsheet data found.

## 2020-12-18 NOTE — Plan of Care (Signed)
  Problem: Clinical Measurements: Goal: Ability to maintain clinical measurements within normal limits will improve Outcome: Progressing   Problem: Pain Managment: Goal: General experience of comfort will improve Outcome: Progressing   Problem: Activity: Goal: Ability to avoid complications of mobility impairment will improve Outcome: Progressing   

## 2020-12-18 NOTE — Progress Notes (Addendum)
   Subjective: 1 Day Post-Op Procedure(s) (LRB): TOTAL HIP ARTHROPLASTY ANTERIOR APPROACH (Right) Patient reports pain as mild.   Patient seen in rounds with Dr. Charlann Boxer. Patient is well, and has had no acute complaints or problems. She is resting in bed this AM. Voiding without difficulty.  We will start therapy today.   Objective: Vital signs in last 24 hours: Temp:  [97.8 F (36.6 C)-98.7 F (37.1 C)] 98.4 F (36.9 C) (09/28 0550) Pulse Rate:  [53-75] 57 (09/28 0550) Resp:  [13-20] 16 (09/28 0550) BP: (105-183)/(40-83) 128/50 (09/28 0550) SpO2:  [98 %-100 %] 99 % (09/28 0550) Weight:  [87.5 kg] 87.5 kg (09/27 1051)  Intake/Output from previous day:  Intake/Output Summary (Last 24 hours) at 12/18/2020 0736 Last data filed at 12/18/2020 0600 Gross per 24 hour  Intake 3388.67 ml  Output 2250 ml  Net 1138.67 ml     Intake/Output this shift: No intake/output data recorded.  Labs: Recent Labs    12/18/20 0336  HGB 11.0*   Recent Labs    12/18/20 0336  WBC 8.5  RBC 3.90  HCT 34.6*  PLT 122*   Recent Labs    12/18/20 0336  NA 138  K 3.9  CL 106  CO2 25  BUN 13  CREATININE 0.67  GLUCOSE 187*  CALCIUM 8.2*   No results for input(s): LABPT, INR in the last 72 hours.  Exam: General - Patient is Alert and Oriented Extremity - Neurologically intact Sensation intact distally Intact pulses distally Dorsiflexion/Plantar flexion intact Dressing - dressing C/D/I Motor Function - intact, moving foot and toes well on exam.   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 Wilburn Mylar    Assessment/Plan: 1 Day Post-Op Procedure(s) (LRB): TOTAL HIP ARTHROPLASTY ANTERIOR APPROACH (Right) Active Problems:   S/P right total hip arthroplasty   S/P total right hip arthroplasty  Estimated body mass index is 35.3 kg/m as calculated from the following:   Height as of this encounter: 5\' 2"  (1.575 m).   Weight as of this encounter: 87.5 kg. Advance diet Up with therapy  DVT Prophylaxis - Aspirin Weight bearing as tolerated.  Plan is to go Home after hospital stay.  Hemoglobin stable at 11 this AM.  Plan to get up with PT today to begin working on mobility. She has minimal support at home, and will likely need to stay until tomorrow in order to progress to a level of safe ambulation at home.   We will use Keflex 500 QID for prophylaxis against wound infection.   , PA-C Orthopedic Surgery [phone] 12/18/2020, 7:36 AM

## 2020-12-19 DIAGNOSIS — M1611 Unilateral primary osteoarthritis, right hip: Secondary | ICD-10-CM | POA: Diagnosis not present

## 2020-12-19 LAB — CBC
HCT: 32.3 % — ABNORMAL LOW (ref 36.0–46.0)
Hemoglobin: 10.4 g/dL — ABNORMAL LOW (ref 12.0–15.0)
MCH: 28.2 pg (ref 26.0–34.0)
MCHC: 32.2 g/dL (ref 30.0–36.0)
MCV: 87.5 fL (ref 80.0–100.0)
Platelets: 117 10*3/uL — ABNORMAL LOW (ref 150–400)
RBC: 3.69 MIL/uL — ABNORMAL LOW (ref 3.87–5.11)
RDW: 14.7 % (ref 11.5–15.5)
WBC: 14 10*3/uL — ABNORMAL HIGH (ref 4.0–10.5)
nRBC: 0 % (ref 0.0–0.2)

## 2020-12-19 NOTE — Plan of Care (Signed)
  Problem: Health Behavior/Discharge Planning: Goal: Ability to manage health-related needs will improve Outcome: Progressing   Problem: Clinical Measurements: Goal: Ability to maintain clinical measurements within normal limits will improve Outcome: Progressing   Problem: Nutrition: Goal: Adequate nutrition will be maintained Outcome: Progressing   Problem: Coping: Goal: Level of anxiety will decrease Outcome: Progressing   Problem: Elimination: Goal: Will not experience complications related to bowel motility Outcome: Progressing   Problem: Safety: Goal: Ability to remain free from injury will improve Outcome: Progressing   Problem: Skin Integrity: Goal: Risk for impaired skin integrity will decrease Outcome: Progressing   

## 2020-12-19 NOTE — Progress Notes (Signed)
Physical Therapy Treatment Patient Details Name: Susan Boone MRN: [id number] DOB: [birth year] Today's Date: 12/19/2020   History of Present Illness Pt is an 86 year old female s/p Rt DA THA.  PMHx: venous insufficiency    PT Comments    Pt requiring increased time and effort however only required min assist for steadying upon standing.  Pt able to progress to ambulating 50 feet this afternoon.  Pt making slow but steady progress.   Recommendations for follow up therapy are one component of a multi-disciplinary discharge planning process, led by the attending physician.  Recommendations may be updated based on patient status, additional functional criteria and insurance authorization.  Follow Up Recommendations  Follow surgeon's recommendation for DC plan and follow-up therapies;Home health PT     Equipment Recommendations  Rolling walker with 5" wheels    Recommendations for Other Services       Precautions / Restrictions Precautions Precautions: Fall Restrictions RLE Weight Bearing: Weight bearing as tolerated     Mobility  Bed Mobility Overal bed mobility: Needs Assistance Bed Mobility: Sit to Supine     Supine to sit: Min guard;HOB elevated Sit to supine: Min guard;HOB elevated   General bed mobility comments: verbal cues for self assist, pt utilizing bed rails to assist    Transfers Overall transfer level: Needs assistance Equipment used: Rolling walker (2 wheeled) Transfers: Sit to/from Stand Sit to Stand: Min assist         General transfer comment: verbal cues for UE and LE positioning, assist for steadying upon rise with weight shifting  Ambulation/Gait Ambulation/Gait assistance: Min guard Gait Distance (Feet): 50 Feet Assistive device: Rolling walker (2 wheeled) Gait Pattern/deviations: Step-to pattern;Decreased stance time - right;Antalgic     General Gait Details: verbal cues for sequence, RW positioning, posture   Stairs              Wheelchair Mobility    Modified Rankin (Stroke Patients Only)       Balance                                            Cognition Arousal/Alertness: Awake/alert Behavior During Therapy: WFL for tasks assessed/performed Overall Cognitive Status: Within Functional Limits for tasks assessed                                        Exercises      General Comments        Pertinent Vitals/Pain Pain Assessment: 0-10 Pain Score: 4  Pain Location: right hip Pain Descriptors / Indicators: Sore;Aching Pain Intervention(s): Repositioned;Monitored during session;Premedicated before session    Home Living                      Prior Function            PT Goals (current goals can now be found in the care plan section) Progress towards PT goals: Progressing toward goals    Frequency    7X/week      PT Plan Current plan remains appropriate    Co-evaluation              AM-PAC PT "6 Clicks" Mobility   Outcome Measure  Help needed turning from your back to your side while in a  flat bed without using bedrails?: A Little Help needed moving from lying on your back to sitting on the side of a flat bed without using bedrails?: A Little Help needed moving to and from a bed to a chair (including a wheelchair)?: A Little Help needed standing up from a chair using your arms (e.g., wheelchair or bedside chair)?: A Little Help needed to walk in hospital room?: A Little Help needed climbing 3-5 steps with a railing? : A Lot 6 Click Score: 17    End of Session Equipment Utilized During Treatment: Gait belt Activity Tolerance: Patient tolerated treatment well Patient left: in bed;with bed alarm set;with call bell/phone within reach   PT Visit Diagnosis: Other abnormalities of gait and mobility (R26.89)     Time: 5329-9242 PT Time Calculation (min) (ACUTE ONLY): 35 min  Charges:  $Gait Training: 23-37 mins                      Thomasene Mohair PT, DPT Acute Rehabilitation Services Pager: [phone] Office: [phone]   Janan Halter Payson 12/19/2020, 3:54 PM

## 2020-12-19 NOTE — Progress Notes (Signed)
Patient ID: Susan Boone, female   DOB: [birth year], 86 y.o.   MRN: [id number] Subjective: 2 Days Post-Op Procedure(s) (LRB): TOTAL HIP ARTHROPLASTY ANTERIOR APPROACH (Right)    Patient reports pain as moderate. Very slow progress related to significant pre-operative deconditioning and bilateral knee osteoarthritis though knees feel better after injections in OR  Objective:   VITALS:   Vitals:   12/18/20 2141 12/19/20 0641  BP: (!) 154/51 140/67  Pulse: 66 (!) 58  Resp:  12  Temp:  98.6 F (37 C)  SpO2:  95%    Neurovascular intact Incision: dressing C/D/I  LABS Recent Labs    12/18/20 0336 12/19/20 0312  HGB 11.0* 10.4*  HCT 34.6* 32.3*  WBC 8.5 14.0*  PLT 122* 117*    Recent Labs    12/18/20 0336  NA 138  K 3.9  BUN 13  CREATININE 0.67  GLUCOSE 187*    No results for input(s): LABPT, INR in the last 72 hours.   Assessment/Plan: 2 Days Post-Op Procedure(s) (LRB): TOTAL HIP ARTHROPLASTY ANTERIOR APPROACH (Right)   Up with therapy Need to determine disposition either home with HHPT versus SNF which I would not be in favor of but we need to continue to push forward Continue inpatient PT for mobility assessment and needs

## 2020-12-19 NOTE — Plan of Care (Signed)
Plan of care reviewed and discussed with the patient. 

## 2020-12-19 NOTE — Progress Notes (Signed)
Physical Therapy Treatment Patient Details Name: Susan Boone MRN: [id number] DOB: 1934/09/10 Today's Date: 12/19/2020   History of Present Illness Pt is an 86 year old female s/p Rt DA THA.  PMHx: venous insufficiency    PT Comments    Pt assisted with donning undergarments again today at [facility].  Skin less red today and also applied more barrier cream.  Pt able to ambulate in hallway and encouraged to improve distance (able to add 10 feet today) if possible. Pt fatigues quickly mostly in UEs.  Pt likely not yet ready for d/c today as she will need to ambulate improved distance and perform steps in order to d/c home.    Recommendations for follow up therapy are one component of a multi-disciplinary discharge planning process, led by the attending physician.  Recommendations may be updated based on patient status, additional functional criteria and insurance authorization.  Follow Up Recommendations  Follow surgeon's recommendation for DC plan and follow-up therapies;Home health PT     Equipment Recommendations  Rolling walker with 5" wheels    Recommendations for Other Services       Precautions / Restrictions Precautions Precautions: Fall Restrictions RLE Weight Bearing: Weight bearing as tolerated     Mobility  Bed Mobility Overal bed mobility: Needs Assistance Bed Mobility: Supine to Sit     Supine to sit: Min guard;HOB elevated     General bed mobility comments: verbal cues for self assist, pt utilizing bed rails to assist    Transfers Overall transfer level: Needs assistance Equipment used: Rolling walker (2 wheeled) Transfers: Sit to/from Stand Sit to Stand: Min assist         General transfer comment: verbal cues for UE and LE positioning, assist for rise and steady  Ambulation/Gait Ambulation/Gait assistance: Min guard Gait Distance (Feet): 34 Feet Assistive device: Rolling walker (2 wheeled) Gait Pattern/deviations: Step-to pattern;Decreased  stance time - right;Antalgic     General Gait Details: verbal cues for sequence, RW positioning, posture; UEs fatigue quickly   Stairs             Wheelchair Mobility    Modified Rankin (Stroke Patients Only)       Balance                                            Cognition Arousal/Alertness: Awake/alert Behavior During Therapy: WFL for tasks assessed/performed Overall Cognitive Status: Within Functional Limits for tasks assessed                                        Exercises      General Comments        Pertinent Vitals/Pain Pain Assessment: 0-10 Pain Score: 4  Pain Location: right hip Pain Descriptors / Indicators: Sore;Aching Pain Intervention(s): Repositioned;Monitored during session;Premedicated before session    Home Living                      Prior Function            PT Goals (current goals can now be found in the care plan section) Progress towards PT goals: Progressing toward goals    Frequency    7X/week      PT Plan Current plan remains appropriate    Co-evaluation  AM-PAC PT "6 Clicks" Mobility   Outcome Measure  Help needed turning from your back to your side while in a flat bed without using bedrails?: A Little Help needed moving from lying on your back to sitting on the side of a flat bed without using bedrails?: A Little Help needed moving to and from a bed to a chair (including a wheelchair)?: A Little Help needed standing up from a chair using your arms (e.g., wheelchair or bedside chair)?: A Little Help needed to walk in hospital room?: A Little Help needed climbing 3-5 steps with a railing? : A Lot 6 Click Score: 17    End of Session Equipment Utilized During Treatment: Gait belt Activity Tolerance: Patient tolerated treatment well Patient left: with call bell/phone within reach;in chair;with chair alarm set;with family/visitor present   PT Visit  Diagnosis: Other abnormalities of gait and mobility (R26.89)     Time: 1749-4496 PT Time Calculation (min) (ACUTE ONLY): 29 min  Charges:  $Gait Training: 8-22 mins $Therapeutic Activity: 8-22 mins                    Thomasene Mohair PT, DPT Acute Rehabilitation Services Pager: [phone] Office: [phone]   Susan Boone Payson 12/19/2020, 2:55 PM

## 2020-12-20 DIAGNOSIS — M1611 Unilateral primary osteoarthritis, right hip: Secondary | ICD-10-CM | POA: Diagnosis not present

## 2020-12-20 NOTE — Plan of Care (Signed)
  Problem: Health Behavior/Discharge Planning: Goal: Ability to manage health-related needs will improve 12/20/2020 0914 by Darrow Bussing, RN Outcome: Progressing 12/20/2020 0847 by Darrow Bussing, RN Outcome: Progressing   Problem: Clinical Measurements: Goal: Ability to maintain clinical measurements within normal limits will improve 12/20/2020 0914 by Darrow Bussing, RN Outcome: Progressing 12/20/2020 0847 by Darrow Bussing, RN Outcome: Progressing   Problem: Activity: Goal: Risk for activity intolerance will decrease 12/20/2020 0914 by Darrow Bussing, RN Outcome: Progressing 12/20/2020 0847 by Darrow Bussing, RN Outcome: Progressing   Problem: Coping: Goal: Level of anxiety will decrease 12/20/2020 0914 by Darrow Bussing, RN Outcome: Progressing 12/20/2020 0847 by Darrow Bussing, RN Outcome: Progressing   Problem: Elimination: Goal: Will not experience complications related to bowel motility 12/20/2020 0914 by Darrow Bussing, RN Outcome: Progressing 12/20/2020 0847 by Darrow Bussing, RN Outcome: Progressing   Problem: Pain Managment: Goal: General experience of comfort will improve 12/20/2020 0914 by Darrow Bussing, RN Outcome: Progressing 12/20/2020 0847 by Darrow Bussing, RN Outcome: Progressing   Problem: Safety: Goal: Ability to remain free from injury will improve 12/20/2020 0914 by Darrow Bussing, RN Outcome: Progressing 12/20/2020 0847 by Darrow Bussing, RN Outcome: Progressing   Problem: Skin Integrity: Goal: Risk for impaired skin integrity will decrease 12/20/2020 0914 by Darrow Bussing, RN Outcome: Progressing 12/20/2020 0847 by Darrow Bussing, RN Outcome: Progressing

## 2020-12-20 NOTE — Plan of Care (Signed)

## 2020-12-20 NOTE — Progress Notes (Signed)
Subjective: 3 Days Post-Op Procedure(s) (LRB): TOTAL HIP ARTHROPLASTY ANTERIOR APPROACH (Right) Patient reports pain as mild.   Patient seen in rounds by Dr. Charlann Boxer. Patient is well, and has had no acute complaints or problems. No acute events overnight. Voiding without difficulty. Patient ambulated 50 feet with PT. She has concerns about "learning to use her new body" at home, and is worried she is not ready to get home with the help of her husband yet.  We will continue therapy today.   Objective: Vital signs in last 24 hours: Temp:  [98.1 F (36.7 C)-98.7 F (37.1 C)] 98.2 F (36.8 C) (09/30 0532) Pulse Rate:  [59-66] 59 (09/30 0532) Resp:  [17-18] 18 (09/30 0532) BP: (140-165)/(53-67) 165/64 (09/30 0532) SpO2:  [95 %-99 %] 96 % (09/30 0532)  Intake/Output from previous day:  Intake/Output Summary (Last 24 hours) at 12/20/2020 0738 Last data filed at 12/20/2020 0532 Gross per 24 hour  Intake --  Output 1400 ml  Net -1400 ml     Intake/Output this shift: No intake/output data recorded.  Labs: Recent Labs    12/18/20 0336 12/19/20 0312  HGB 11.0* 10.4*   Recent Labs    12/18/20 0336 12/19/20 0312  WBC 8.5 14.0*  RBC 3.90 3.69*  HCT 34.6* 32.3*  PLT 122* 117*   Recent Labs    12/18/20 0336  NA 138  K 3.9  CL 106  CO2 25  BUN 13  CREATININE 0.67  GLUCOSE 187*  CALCIUM 8.2*   No results for input(s): LABPT, INR in the last 72 hours.  Exam: General - Patient is Alert and Oriented Extremity - Neurologically intact Sensation intact distally Intact pulses distally Dorsiflexion/Plantar flexion intact Dressing - dressing C/D/I Motor Function - intact, moving foot and toes well on exam.   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 Wilburn Mylar    Assessment/Plan: 3 Days Post-Op Procedure(s) (LRB): TOTAL HIP ARTHROPLASTY ANTERIOR APPROACH (Right) Active Problems:   S/P right total hip arthroplasty   S/P total right hip arthroplasty  Estimated body mass index is 35.3 kg/m as calculated from the following:   Height as of this encounter: 5\' 2"  (1.575 m).   Weight as of this encounter: 87.5 kg. Advance diet Up with therapy  Anticipated LOS equal to or greater than 2 midnights due to - Age 49 and older with one or more of the following:  - Obesity  - Expected need for hospital services (PT, OT, Nursing) required for safe  discharge  - Anticipated need for postoperative skilled nursing care or inpatient rehab  - Active co-morbidities: None  OR   - Unanticipated findings during/Post Surgery: Slow post-op progression: GI, pain control, mobility  - Patient is a high risk of re-admission due to: None   DVT Prophylaxis - Aspirin Weight bearing as tolerated.  Hgb stable at 10.4.  Dr. Charlann Boxer had a long discussion with her today that we will either need to progress to d/c home or go ahead and work towards SNF placement at this point. She does wish to pursue SNF, and we will begin working on placement today. By Monday, we should either have placement at SNF or she should be progressed enough to D/C home.   Continue working with PT while inpatient.    Dennie Bible,  PA-C Orthopedic Surgery [phone] 12/20/2020, 7:38 AM

## 2020-12-20 NOTE — Plan of Care (Signed)
Plan of care reviewed and discussed with the patient and her significant other.

## 2020-12-20 NOTE — Progress Notes (Signed)
Physical Therapy Treatment Patient Details Name: Susan Boone MRN: [id number] DOB: 1935/01/25 Today's Date: 12/20/2020   History of Present Illness Pt is an 86 year old female s/p Rt DA THA.  PMHx: venous insufficiency    PT Comments    Pt assisted with ambulating in hallway and although requires time and set up for tasks is able to perform without physical assist.  Pt slowly improving in mobility and confidence.    Recommendations for follow up therapy are one component of a multi-disciplinary discharge planning process, led by the attending physician.  Recommendations may be updated based on patient status, additional functional criteria and insurance authorization.  Follow Up Recommendations  Follow surgeon's recommendation for DC plan and follow-up therapies;Home health PT     Equipment Recommendations  Rolling walker with 5" wheels    Recommendations for Other Services       Precautions / Restrictions Precautions Precautions: Fall Restrictions RLE Weight Bearing: Weight bearing as tolerated     Mobility  Bed Mobility Overal bed mobility: Needs Assistance Bed Mobility: Supine to Sit     Supine to sit: Min guard;HOB elevated     General bed mobility comments: verbal cues for self assist, pt utilizing bed rails to assist    Transfers Overall transfer level: Needs assistance Equipment used: Rolling walker (2 wheeled) Transfers: Sit to/from Stand Sit to Stand: Min guard         General transfer comment: verbal cues for UE and LE positioning  Ambulation/Gait Ambulation/Gait assistance: Min guard Gait Distance (Feet): 75 Feet Assistive device: Rolling walker (2 wheeled) Gait Pattern/deviations: Step-to pattern;Decreased stance time - right;Antalgic     General Gait Details: verbal cues for sequence, RW positioning, posture   Stairs             Wheelchair Mobility    Modified Rankin (Stroke Patients Only)       Balance                                             Cognition Arousal/Alertness: Awake/alert Behavior During Therapy: WFL for tasks assessed/performed Overall Cognitive Status: Within Functional Limits for tasks assessed                                        Exercises Total Joint Exercises Long Arc Quad: AROM;Right;10 reps;Seated    General Comments        Pertinent Vitals/Pain Pain Assessment: 0-10 Pain Score: 3  Pain Location: right hip Pain Descriptors / Indicators: Sore;Aching Pain Intervention(s): Monitored during session;Repositioned;Premedicated before session    Home Living                      Prior Function            PT Goals (current goals can now be found in the care plan section) Progress towards PT goals: Progressing toward goals    Frequency    7X/week      PT Plan Current plan remains appropriate    Co-evaluation              AM-PAC PT "6 Clicks" Mobility   Outcome Measure  Help needed turning from your back to your side while in a flat bed without using bedrails?: A Little Help  needed moving from lying on your back to sitting on the side of a flat bed without using bedrails?: A Little Help needed moving to and from a bed to a chair (including a wheelchair)?: A Little Help needed standing up from a chair using your arms (e.g., wheelchair or bedside chair)?: A Little Help needed to walk in hospital room?: A Little Help needed climbing 3-5 steps with a railing? : A Lot 6 Click Score: 17    End of Session Equipment Utilized During Treatment: Gait belt Activity Tolerance: Patient tolerated treatment well Patient left: with call bell/phone within reach;in chair;with chair alarm set   PT Visit Diagnosis: Other abnormalities of gait and mobility (R26.89)     Time: 1031-1105 PT Time Calculation (min) (ACUTE ONLY): 34 min  Charges:  $Gait Training: 23-37 mins                     Thomasene Mohair PT, DPT Acute Rehabilitation  Services Pager: [phone] Office: [phone]    Janan Halter P[place] 12/20/2020, 3:23 PM

## 2020-12-20 NOTE — Progress Notes (Signed)
Physical Therapy Treatment Patient Details Name: Susan Boone MRN: [id number] DOB: 1934/10/22 Today's Date: 12/20/2020   History of Present Illness Pt is an 86 year old female s/p Rt DA THA.  PMHx: venous insufficiency    PT Comments    Pt ambulated in hallway and tolerated 100 ft with RW.  Pt also assisted with toilet transfer per her request.  Practiced sit to stand from [facility] over toilet (pt has similar set up at home).  Pt then requested return to recliner to remain OOB today.  Pt anticipates continuing to work with PT this weekend and hopeful for confidence to d/c home by Monday.    Recommendations for follow up therapy are one component of a multi-disciplinary discharge planning process, led by the attending physician.  Recommendations may be updated based on patient status, additional functional criteria and insurance authorization.  Follow Up Recommendations  Follow surgeon's recommendation for DC plan and follow-up therapies;Home health PT     Equipment Recommendations  Rolling walker with 5" wheels    Recommendations for Other Services       Precautions / Restrictions Precautions Precautions: Fall Restrictions RLE Weight Bearing: Weight bearing as tolerated     Mobility  Bed Mobility Overal bed mobility: Needs Assistance Bed Mobility: Supine to Sit     Supine to sit: Min guard;HOB elevated     General bed mobility comments: pt in recliner on arrival    Transfers Overall transfer level: Needs assistance Equipment used: Rolling walker (2 wheeled) Transfers: Sit to/from Stand Sit to Stand: Min guard         General transfer comment: verbal cues for UE and LE positioning  Ambulation/Gait Ambulation/Gait assistance: Min guard Gait Distance (Feet): 100 Feet Assistive device: Rolling walker (2 wheeled) Gait Pattern/deviations: Step-to pattern;Decreased stance time - right;Antalgic     General Gait Details: verbal cues for sequence, RW positioning,  posture   Stairs             Wheelchair Mobility    Modified Rankin (Stroke Patients Only)       Balance                                            Cognition Arousal/Alertness: Awake/alert Behavior During Therapy: WFL for tasks assessed/performed Overall Cognitive Status: Within Functional Limits for tasks assessed                                        Exercises Total Joint Exercises Long Arc Quad: AROM;Right;10 reps;Seated    General Comments        Pertinent Vitals/Pain Pain Assessment: 0-10 Pain Score: 3  Pain Location: right hip Pain Descriptors / Indicators: Sore;Aching Pain Intervention(s): Monitored during session;Premedicated before session;Repositioned    Home Living                      Prior Function            PT Goals (current goals can now be found in the care plan section) Progress towards PT goals: Progressing toward goals    Frequency    7X/week      PT Plan Current plan remains appropriate    Co-evaluation  AM-PAC PT "6 Clicks" Mobility   Outcome Measure  Help needed turning from your back to your side while in a flat bed without using bedrails?: A Little Help needed moving from lying on your back to sitting on the side of a flat bed without using bedrails?: A Little Help needed moving to and from a bed to a chair (including a wheelchair)?: A Little Help needed standing up from a chair using your arms (e.g., wheelchair or bedside chair)?: A Little Help needed to walk in hospital room?: A Little Help needed climbing 3-5 steps with a railing? : A Lot 6 Click Score: 17    End of Session Equipment Utilized During Treatment: Gait belt Activity Tolerance: Patient tolerated treatment well Patient left: in chair;with call bell/phone within reach;with family/visitor present   PT Visit Diagnosis: Other abnormalities of gait and mobility (R26.89)     Time:  2952-8413 PT Time Calculation (min) (ACUTE ONLY): 35 min  Charges:  $Gait Training: 23-37 mins                     Thomasene Mohair PT, DPT Acute Rehabilitation Services Pager: [phone] Office: [phone]  Janan Halter Payson 12/20/2020, 3:26 PM

## 2020-12-20 NOTE — TOC Progression Note (Signed)
Transition of Care [facility]) - Progression Note    Patient Details  Name: Susan Boone MRN: [id number] Date of Birth: 1934/05/23  Transition of [facility]) CM/SW Contact  Lennart Pall, LCSW Phone Number: 12/20/2020, 4:14 PM  Clinical Narrative:    Met with pt today per request of ortho PA to discuss possible SNF plan.  Pt very pleasant and feels that she has "just begun" making some progress and notes, "I know I have to be out of here by Monday."  Pt is agreeable for TOC to prep for SNF bed if she is unable to reach a safe, functional level to return home.       Barriers to Discharge: No Barriers Identified  Expected Discharge Plan and Services                           DME Arranged: Walker rolling DME Agency: [employer]     Representative spoke with at DME Agency: Prearranged in orthopedist's office             Social Determinants of Health (SDOH) Interventions    Readmission Risk Interventions No flowsheet data found.

## 2020-12-21 DIAGNOSIS — M1611 Unilateral primary osteoarthritis, right hip: Secondary | ICD-10-CM | POA: Diagnosis not present

## 2020-12-21 MED ORDER — NYSTATIN 100000 UNIT/ML MT SUSP
5.0000 mL | Freq: Four times a day (QID) | OROMUCOSAL | Status: DC
  Administered 2020-12-21 – 2020-12-24 (×9): 500000 [IU] via ORAL
  Filled 2020-12-21 (×9): qty 5

## 2020-12-21 MED ORDER — NYSTATIN 500000 UNITS PO TABS: 500000.0000 [IU] | ORAL_TABLET | Freq: Three times a day (TID) | ORAL | Status: DC

## 2020-12-21 NOTE — Progress Notes (Addendum)
Physical Therapy Treatment Patient Details Name: Susan Boone MRN: [id number] DOB: Nov 16, 1934 Today's Date: 12/21/2020   History of Present Illness Pt is an 86 year old female s/p Rt DA THA.  PMHx: venous insufficiency    PT Comments    Pt making gradual progress with mobility, but continue to need max cues for transfer techniques and gait.   Pt remains very unconfident in her ability and fearful of falling at home. Tried providing encouragement and acknowledging what she is doing on her own.  She did perform 3 steps with min A.  Would recommend having [name] who will be helping her at home be present for transfer/gait/stair training - discussed with pt.   Pt asked about HHPT - advised to discuss with surgeon or PA.     Recommendations for follow up therapy are one component of a multi-disciplinary discharge planning process, led by the attending physician.  Recommendations may be updated based on patient status, additional functional criteria and insurance authorization.  Follow Up Recommendations  Follow surgeon's recommendation for DC plan and follow-up therapies     Equipment Recommendations  Rolling walker with 5" wheels    Recommendations for Other Services       Precautions / Restrictions Precautions Precautions: Fall Restrictions RLE Weight Bearing: Weight bearing as tolerated     Mobility  Bed Mobility Overal bed mobility: Needs Assistance Bed Mobility: Supine to Sit;Sit to Supine     Supine to sit: Supervision;HOB elevated     General bed mobility comments: Increased time with max cues for sequencing.  Encouraged not to use rails and educated on how to do so.  Utilized gait belt for AAROM R leg.    Transfers Overall transfer level: Needs assistance Equipment used: Rolling walker (2 wheeled) Transfers: Sit to/from Stand Sit to Stand: Min assist         General transfer comment: Max cues and min A to lift  Ambulation/Gait Ambulation/Gait  assistance: Min guard Gait Distance (Feet): 80 Feet Assistive device: Rolling walker (2 wheeled) Gait Pattern/deviations: Step-to pattern;Decreased weight shift to right Gait velocity: decreased   General Gait Details: Cues for sequencing and RW proximity (tends to be too close); heavy reliance on RW   Stairs Stairs: Yes Stairs assistance: Min assist Stair Management: Two rails;Step to pattern;Forwards Number of Stairs: 3 General stair comments: Pt with low height steps at home ~4".  Performed 3 steps with bil rails and max cues for sequencing.  With coming down, required assist to bring R leg off step.   Wheelchair Mobility    Modified Rankin (Stroke Patients Only)       Balance Overall balance assessment: Needs assistance Sitting-balance support: No upper extremity supported Sitting balance-Leahy Scale: Good     Standing balance support: Bilateral upper extremity supported Standing balance-Leahy Scale: Poor Standing balance comment: requiring RW                            Cognition Arousal/Alertness: Awake/alert Behavior During Therapy: Anxious Overall Cognitive Status: Within Functional Limits for tasks assessed                                        Exercises Total Joint Exercises Ankle Circles/Pumps: AROM;Both;20 reps Heel Slides: AAROM;Right;10 reps Hip ABduction/ADduction: AAROM;Right;10 reps Long Arc Quad: AROM;Right;10 reps;Seated    General Comments  Pertinent Vitals/Pain Pain Assessment: Faces Faces Pain Scale: Hurts little more Pain Location: right hip Pain Descriptors / Indicators: Sore;Aching Pain Intervention(s): Limited activity within patient's tolerance;Monitored during session;Premedicated before session    Home Living                      Prior Function            PT Goals (current goals can now be found in the care plan section) Progress towards PT goals: Progressing toward goals     Frequency    7X/week      PT Plan Current plan remains appropriate    Co-evaluation              AM-PAC PT "6 Clicks" Mobility   Outcome Measure  Help needed turning from your back to your side while in a flat bed without using bedrails?: A Little Help needed moving from lying on your back to sitting on the side of a flat bed without using bedrails?: A Lot (Note "a lot" for mod-max cues; only min guard-min A transfers for all) Help needed moving to and from a bed to a chair (including a wheelchair)?: A Lot Help needed standing up from a chair using your arms (e.g., wheelchair or bedside chair)?: A Lot Help needed to walk in hospital room?: A Lot Help needed climbing 3-5 steps with a railing? : A Lot 6 Click Score: 13    End of Session Equipment Utilized During Treatment: Gait belt Activity Tolerance: Patient tolerated treatment well Patient left: with call bell/phone within reach;with chair alarm set;in chair Nurse Communication: Mobility status PT Visit Diagnosis: Other abnormalities of gait and mobility (R26.89)     Time: 1443-1540 PT Time Calculation (min) (ACUTE ONLY): 36 min  Charges:  $Gait Training: 8-22 mins $Therapeutic Exercise: 8-22 mins                     Susan Boone, PT Acute Rehab Services Pager [phone] Susan Boone Rehab [phone]    Susan Boone 12/21/2020, 3:38 PM

## 2020-12-21 NOTE — Plan of Care (Signed)

## 2020-12-21 NOTE — Progress Notes (Signed)
    Subjective: 4 Days Post-Op Procedure(s) (LRB): TOTAL HIP ARTHROPLASTY ANTERIOR APPROACH (Right) Patient reports pain as mild and moderate.   Did better with PT yesterday but not near goals for discharge Plan is to go home vs SNF depending on progress today and tomorrow   Objective: Vital signs in last 24 hours: Temp:  [98.9 F (37.2 C)-99.5 F (37.5 C)] 98.9 F (37.2 C) (10/01 0517) Pulse Rate:  [63] 63 (10/01 0517) Resp:  [18-19] 18 (10/01 0517) BP: (153-159)/(58-70) 153/58 (10/01 0517) SpO2:  [96 %] 96 % (10/01 0517)  Intake/Output from previous day:  Intake/Output Summary (Last 24 hours) at 12/21/2020 0850 Last data filed at 12/21/2020 0524 Gross per 24 hour  Intake 120 ml  Output 1400 ml  Net -1280 ml    Intake/Output this shift: No intake/output data recorded.  Labs: Recent Labs    12/19/20 0312  HGB 10.4*   Recent Labs    12/19/20 0312  WBC 14.0*  RBC 3.69*  HCT 32.3*  PLT 117*   No results for input(s): NA, K, CL, CO2, BUN, CREATININE, GLUCOSE, CALCIUM in the last 72 hours. No results for input(s): LABPT, INR in the last 72 hours.  EXAM General - Patient is Alert, Appropriate, and Disorganized Extremity - Neurologically intact Neurovascular intact Incision: dressing C/D/I No cellulitis present Compartment soft Dressing - dressing C/D/I Motor Function - intact, moving foot and toes well on exam.    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 Wilburn Mylar    Assessment/Plan: 4 Days Post-Op Procedure(s) (LRB): TOTAL HIP ARTHROPLASTY ANTERIOR APPROACH (Right) Active Problems:   S/P right total hip arthroplasty   S/P total right hip arthroplasty   Up with therapy   Weight Bearing As Tolerated right Leg   Ollen Gross

## 2020-12-21 NOTE — Plan of Care (Signed)
  Problem: Coping: Goal: Level of anxiety will decrease Outcome: Progressing   Problem: Elimination: Goal: Will not experience complications related to bowel motility Outcome: Progressing   Problem: Pain Managment: Goal: General experience of comfort will improve Outcome: Progressing   Problem: Safety: Goal: Ability to remain free from injury will improve Outcome: Progressing   

## 2020-12-21 NOTE — NC FL2 (Signed)
[place] MEDICAID FL2 LEVEL OF CARE SCREENING TOOL     IDENTIFICATION  Patient Name: Susan Boone Birthdate: 18-Feb-1935 Sex: female Admission Date (Current Location): 12/17/2020  [facility] and [place] Number:  Producer, television/film/video and Address:  [facility],  [address] [place], [place] [address]      Provider Number: [id number]  Attending Physician Name and Address:  Durene Romans, MD  Relative Name and Phone Number:       Current Level of Care: Hospital Recommended Level of Care: Skilled Nursing Facility Prior Approval Number:    Date Approved/Denied:   PASRR Number: [id number]  Discharge Plan: SNF    Current Diagnoses: Patient Active Problem List   Diagnosis Date Noted   S/P right total hip arthroplasty 12/17/2020   S/P total right hip arthroplasty 12/17/2020   Hyperlipidemia, mixed 04/18/2020   Dry mouth 04/18/2020   Hyperglycemia 12/11/2019   Right kidney mass 07/16/2019   Ruptured right breast implant, sequela 07/16/2019   Ruptured left breast implant, sequela 07/16/2019   Neuralgia, postherpetic 07/16/2019   Vitamin D deficiency 05/05/2017   Vitamin B12 deficiency 05/05/2017   Recurrent UTI 07/07/2013   Calculus of kidney 06/23/2011   Environmental allergies 06/23/2011   Lower leg edema 06/23/2011   Migraine 06/23/2011   UMBILICAL HERNIA 10/20/2007   Irritable bowel syndrome with diarrhea 10/20/2007   RENAL CYST, LEFT 10/20/2007   ARTHRITIS 10/20/2007   DYSPHAGIA UNSPECIFIED 10/20/2007   DYSPHAGIA 10/20/2007   DIARRHEA 10/20/2007   ANEMIA, HX OF 10/20/2007   NEPHROLITHIASIS, HX OF 10/20/2007    Orientation RESPIRATION BLADDER Height & Weight     Self, Time, Situation, Place  Normal Continent Weight: 193 lb (87.5 kg) Height:  5\' 2"  (157.5 cm)  BEHAVIORAL SYMPTOMS/MOOD NEUROLOGICAL BOWEL NUTRITION STATUS      Continent    AMBULATORY STATUS COMMUNICATION OF NEEDS Skin   Limited Assist Verbally  (surgical incision only)                        Personal Care Assistance Level of Assistance  Bathing, Dressing Bathing Assistance: Limited assistance   Dressing Assistance: Limited assistance     Functional Limitations Info             SPECIAL CARE FACTORS FREQUENCY  PT (By licensed PT), OT (By licensed OT)     PT Frequency: 5x/wk OT Frequency: 5x/wk            Contractures Contractures Info: Not present    Additional Factors Info  Code Status, Allergies, Psychotropic Code Status Info: Full Allergies Info: Azithromycin, Latex, Codeine, Doxycycline, Nsaids, Tape Psychotropic Info: see MAR         Current Medications (12/21/2020):  This is the current hospital active medication list Current Facility-Administered Medications  Medication Dose Route Frequency Provider Last Rate Last Admin   0.9 %  sodium chloride infusion   Intravenous Continuous 02/20/2021, PA-C 75 mL/hr at 12/17/20 1730 New Bag at 12/17/20 1730   acetaminophen (TYLENOL) tablet 325-650 mg  325-650 mg Oral Q6H PRN 12/19/20, PA-C       aspirin chewable tablet 81 mg  81 mg Oral BID Cassandria Anger, PA-C   81 mg at 12/21/20 02/20/21   bisacodyl (DULCOLAX) suppository 10 mg  10 mg Rectal Daily PRN 5732, PA-C       cephALEXin (KEFLEX) capsule 500 mg  500 mg Oral Q6H Cassandria Anger R, PA-C   500  mg at 12/21/20 1305   Chlorhexidine Gluconate Cloth 2 % PADS 6 each  6 each Topical Daily Durene Romans, MD   6 each at 12/18/20 1010   diphenhydrAMINE (BENADRYL) 12.5 MG/5ML elixir 12.5-25 mg  12.5-25 mg Oral Q4H PRN Cassandria Anger, PA-C       docusate sodium (COLACE) capsule 100 mg  100 mg Oral BID Cassandria Anger, PA-C   100 mg at 12/21/20 9292   ferrous sulfate tablet 325 mg  325 mg Oral TID PC Cassandria Anger, PA-C   325 mg at 12/21/20 1305   HYDROcodone-acetaminophen (NORCO/VICODIN) 5-325 MG per tablet 1-2 tablet  1-2 tablet Oral Q4H PRN Cassandria Anger, PA-C   1 tablet at 12/21/20 1305    menthol-cetylpyridinium (CEPACOL) lozenge 3 mg  1 lozenge Oral PRN Cassandria Anger, PA-C       Or   phenol (CHLORASEPTIC) mouth spray 1 spray  1 spray Mouth/Throat PRN Cassandria Anger, PA-C   1 spray at 12/19/20 2216   methocarbamol (ROBAXIN) tablet 500 mg  500 mg Oral Q6H PRN Cassandria Anger, PA-C   500 mg at 12/21/20 0830   Or   methocarbamol (ROBAXIN) 500 mg in dextrose 5 % 50 mL IVPB  500 mg Intravenous Q6H PRN Cassandria Anger, PA-C 100 mL/hr at 12/17/20 1446 500 mg at 12/17/20 1446   metoCLOPramide (REGLAN) tablet 5-10 mg  5-10 mg Oral Q8H PRN Cassandria Anger, PA-C       Or   metoCLOPramide (REGLAN) injection 5-10 mg  5-10 mg Intravenous Q8H PRN Cassandria Anger, PA-C       morphine 4 MG/ML injection 0.52-1 mg  0.52-1 mg Intravenous Q2H PRN Cassandria Anger, PA-C       nadolol (CORGARD) tablet 40 mg  40 mg Oral QHS Cassandria Anger, PA-C   40 mg at 12/20/20 2251   ondansetron (ZOFRAN) tablet 4 mg  4 mg Oral Q6H PRN Cassandria Anger, PA-C       Or   ondansetron [facility]) injection 4 mg  4 mg Intravenous Q6H PRN Rosalene Billings R, PA-C       polyethylene glycol (MIRALAX / GLYCOLAX) packet 17 g  17 g Oral Daily PRN Cassandria Anger, PA-C       rosuvastatin (CRESTOR) tablet 5 mg  5 mg Oral QHS Cassandria Anger, PA-C   5 mg at 12/20/20 2251   traMADol (ULTRAM) tablet 50-100 mg  50-100 mg Oral Q6H PRN Cassandria Anger, PA-C         Discharge Medications: Please see discharge summary for a list of discharge medications.  Relevant Imaging Results:  Relevant Lab Results:   Additional Information SS#[id number]; COVID vaccine x 3  Rainy Rothman, LCSW

## 2020-12-21 NOTE — Progress Notes (Signed)
Physical Therapy Treatment Patient Details Name: Susan Boone MRN: [id number] DOB: 08-Dec-1934 Today's Date: 12/21/2020   History of Present Illness Pt is an 86 year old female s/p Rt DA THA.  PMHx: venous insufficiency    PT Comments    Pt making gradual progress but still remains apprehensive about ability to return home due to decreased support.  Pt requiring max cues for all transfers and exercise but able to peform at min guard-min A level.  Held stairs due to reports "feeling drunk from muscle relaxer."  Will f/u for therapy later today.     Recommendations for follow up therapy are one component of a multi-disciplinary discharge planning process, led by the attending physician.  Recommendations may be updated based on patient status, additional functional criteria and insurance authorization.  Follow Up Recommendations  Follow surgeon's recommendation for DC plan and follow-up therapies     Equipment Recommendations  Rolling walker with 5" wheels    Recommendations for Other Services       Precautions / Restrictions Precautions Precautions: Fall Restrictions RLE Weight Bearing: Weight bearing as tolerated     Mobility  Bed Mobility Overal bed mobility: Needs Assistance Bed Mobility: Supine to Sit;Sit to Supine     Supine to sit: Supervision;HOB elevated Sit to supine: Supervision;HOB elevated   General bed mobility comments: Increased time with max cues for sequencing.  Encouraged not to use rails and educated on how to do so.  Utilized gait belt for AAROM R leg.    Transfers Overall transfer level: Needs assistance Equipment used: Rolling walker (2 wheeled) Transfers: Sit to/from Stand Sit to Stand: Min assist         General transfer comment: Max cues and min A to lift  Ambulation/Gait Ambulation/Gait assistance: Min guard Gait Distance (Feet): 100 Feet Assistive device: Rolling walker (2 wheeled) Gait Pattern/deviations: Step-to  pattern;Decreased weight shift to right Gait velocity: decreased   General Gait Details: Cues for sequencing and RW proximity (tends to be too close); heavy reliance on RW   Stairs Stairs:  (deferred due to feels "drunk" from muscle relaxer)           Wheelchair Mobility    Modified Rankin (Stroke Patients Only)       Balance Overall balance assessment: Needs assistance Sitting-balance support: No upper extremity supported Sitting balance-Leahy Scale: Good     Standing balance support: Bilateral upper extremity supported Standing balance-Leahy Scale: Poor Standing balance comment: requiring RW                            Cognition Arousal/Alertness: Awake/alert Behavior During Therapy: WFL for tasks assessed/performed Overall Cognitive Status: Within Functional Limits for tasks assessed                                        Exercises Total Joint Exercises Ankle Circles/Pumps: AROM;Both;20 reps Quad Sets: AROM;Both;10 reps;Supine Heel Slides: AAROM;Right;10 reps (educated on use of gait belt) Hip ABduction/ADduction: AAROM;Right;10 reps (educated on use of gait belt) Long Arc Quad: AROM;Right;10 reps;Seated Other Exercises Other Exercises: max cues for all exercise correct form    General Comments General comments (skin integrity, edema, etc.): Pt concerned about return home due to lack of support.  Lives with [name] but reports he can only provide limited support.  She has been sleeping on couch but feels she  needs handrail.  Had her perform transfers without rails but also showed her "couch cane" rail for couch.      Pertinent Vitals/Pain Pain Assessment: 0-10 Pain Score: 3  Pain Location: right hip Pain Descriptors / Indicators: Sore;Aching Pain Intervention(s): Limited activity within patient's tolerance;Monitored during session;Premedicated before session    Home Living                      Prior Function             PT Goals (current goals can now be found in the care plan section) Acute Rehab PT Goals PT Goal Formulation: With patient Progress towards PT goals: Progressing toward goals    Frequency    7X/week      PT Plan Current plan remains appropriate    Co-evaluation              AM-PAC PT "6 Clicks" Mobility   Outcome Measure  Help needed turning from your back to your side while in a flat bed without using bedrails?: A Little Help needed moving from lying on your back to sitting on the side of a flat bed without using bedrails?: A Lot (Note "a lot" for mod-max cues; only min guard-min A transfers for all) Help needed moving to and from a bed to a chair (including a wheelchair)?: A Lot Help needed standing up from a chair using your arms (e.g., wheelchair or bedside chair)?: A Lot Help needed to walk in hospital room?: A Lot Help needed climbing 3-5 steps with a railing? : A Lot 6 Click Score: 13    End of Session Equipment Utilized During Treatment: Gait belt Activity Tolerance: Patient tolerated treatment well Patient left: with call bell/phone within reach;in bed;with bed alarm set Nurse Communication: Mobility status PT Visit Diagnosis: Other abnormalities of gait and mobility (R26.89)     Time: 2706-2376 PT Time Calculation (min) (ACUTE ONLY): 36 min  Charges:  $Gait Training: 8-22 mins $Therapeutic Exercise: 8-22 mins                     Anise Salvo, PT Acute Rehab Services Pager [phone] [facility] [phone]    Rayetta Humphrey 12/21/2020, 10:51 AM

## 2020-12-22 DIAGNOSIS — M1611 Unilateral primary osteoarthritis, right hip: Secondary | ICD-10-CM | POA: Diagnosis not present

## 2020-12-22 NOTE — Plan of Care (Signed)
  Problem: Coping: Goal: Level of anxiety will decrease Outcome: Progressing   Problem: Pain Managment: Goal: General experience of comfort will improve Outcome: Progressing   Problem: Safety: Goal: Ability to remain free from injury will improve Outcome: Progressing   

## 2020-12-22 NOTE — Plan of Care (Signed)
  Problem: Education: Goal: Knowledge of General Education information will improve Description: Including pain rating scale, medication(s)/side effects and non-pharmacologic comfort measures Outcome: Progressing   Problem: Health Behavior/Discharge Planning: Goal: Ability to manage health-related needs will improve Outcome: Progressing   Problem: Activity: Goal: Risk for activity intolerance will decrease Outcome: Progressing   

## 2020-12-22 NOTE — Progress Notes (Signed)
Patient ID: Susan Boone, female   DOB: 1934-04-13, 86 y.o.   MRN: [id number] Subjective: 5 Days Post-Op Procedure(s) (LRB): TOTAL HIP ARTHROPLASTY ANTERIOR APPROACH (Right)    Patient reports pain as moderate. Still limited activity and feels unsafe without assistance Doesn't feel that she will be able to be at home safely  Objective:   VITALS:   Vitals:   12/21/20 2202 12/22/20 0450  BP: (!) 144/46 (!) 173/63  Pulse: 63 (!) 59  Resp: 16 16  Temp: 98.7 F (37.1 C) 98.5 F (36.9 C)  SpO2: 97% 97%    Neurovascular intact Incision: dressing C/D/I  LABS No results for input(s): HGB, HCT, WBC, PLT in the last 72 hours.  No results for input(s): NA, K, BUN, CREATININE, GLUCOSE in the last 72 hours.  No results for input(s): LABPT, INR in the last 72 hours.   Assessment/Plan: 5 Days Post-Op Procedure(s) (LRB): TOTAL HIP ARTHROPLASTY ANTERIOR APPROACH (Right)   Up with therapy Discharge to SNF Given current state of function she is working with [facility] for options for discharge early this week WBAT

## 2020-12-22 NOTE — Progress Notes (Signed)
Physical Therapy Treatment Patient Details Name: Susan Boone MRN: [id number] DOB: [birth year] Today's Date: 12/22/2020   History of Present Illness Pt is an 86 year old female s/p Rt DA THA.  PMHx: venous insufficiency    PT Comments    Pt continues very cooperative and making slow steady progress with mobility including negotiating low rise stairs with rail and cane.  Pt requiring increased time and frequent cueing for completion of all tasks.  Recommendations for follow up therapy are one component of a multi-disciplinary discharge planning process, led by the attending physician.  Recommendations may be updated based on patient status, additional functional criteria and insurance authorization.  Follow Up Recommendations  Follow surgeon's recommendation for DC plan and follow-up therapies     Equipment Recommendations  Rolling walker with 5" wheels    Recommendations for Other Services       Precautions / Restrictions Precautions Precautions: Fall Restrictions Weight Bearing Restrictions: No RLE Weight Bearing: Weight bearing as tolerated     Mobility  Bed Mobility Overal bed mobility: Needs Assistance Bed Mobility: Supine to Sit;Sit to Supine     Supine to sit: Supervision;HOB elevated Sit to supine: Supervision   General bed mobility comments: Increased time with cues for sequencing and with limited use of bed rails    Transfers Overall transfer level: Needs assistance Equipment used: Rolling walker (2 wheeled) Transfers: Sit to/from Stand Sit to Stand: Min guard         General transfer comment: cues for LE management and use of UEs to self assist  Ambulation/Gait Ambulation/Gait assistance: Min guard;Supervision Gait Distance (Feet): 75 Feet Assistive device: Rolling walker (2 wheeled) Gait Pattern/deviations: Step-to pattern;Decreased weight shift to right Gait velocity: decreased   General Gait Details: cues for posture, position from RW and  initial sequence   Stairs Stairs: Yes Stairs assistance: Min assist Stair Management: One rail Right;Step to pattern;Forwards;With cane Number of Stairs: 3 General stair comments: Pt with low height steps at home ~4".  Performed 2+3 steps with R rails and cues for sequencing.  With coming down, required assist to bring R leg off step.   Wheelchair Mobility    Modified Rankin (Stroke Patients Only)       Balance Overall balance assessment: Needs assistance Sitting-balance support: No upper extremity supported Sitting balance-Leahy Scale: Good     Standing balance support: No upper extremity supported Standing balance-Leahy Scale: Fair                              Cognition Arousal/Alertness: Awake/alert Behavior During Therapy: Anxious Overall Cognitive Status: Within Functional Limits for tasks assessed                                        Exercises      General Comments        Pertinent Vitals/Pain Pain Assessment: 0-10 Pain Score: 5  Pain Location: right hip with activity Pain Descriptors / Indicators: Sore;Aching Pain Intervention(s): Limited activity within patient's tolerance;Monitored during session;Premedicated before session    Home Living                      Prior Function            PT Goals (current goals can now be found in the care plan section) Acute Rehab PT  Goals PT Goal Formulation: With patient Time For Goal Achievement: 12/25/20 Potential to Achieve Goals: Good Progress towards PT goals: Progressing toward goals    Frequency    7X/week      PT Plan Current plan remains appropriate    Co-evaluation              AM-PAC PT "6 Clicks" Mobility   Outcome Measure  Help needed turning from your back to your side while in a flat bed without using bedrails?: A Little Help needed moving from lying on your back to sitting on the side of a flat bed without using bedrails?: A Little Help  needed moving to and from a bed to a chair (including a wheelchair)?: A Little Help needed standing up from a chair using your arms (e.g., wheelchair or bedside chair)?: A Little Help needed to walk in hospital room?: A Little Help needed climbing 3-5 steps with a railing? : A Lot 6 Click Score: 17    End of Session Equipment Utilized During Treatment: Gait belt Activity Tolerance: Patient tolerated treatment well Patient left: with call bell/phone within reach;in bed;with bed alarm set Nurse Communication: Mobility status PT Visit Diagnosis: Other abnormalities of gait and mobility (R26.89)     Time: 9628-3662 PT Time Calculation (min) (ACUTE ONLY): 36 min  Charges:  $Gait Training: 8-22 mins $Therapeutic Activity: 8-22 mins                     Mauro Kaufmann PT Acute Rehabilitation Services Pager [phone] Office [phone]    Felicidad Sugarman 12/22/2020, 5:31 PM

## 2020-12-22 NOTE — Plan of Care (Signed)
  Problem: Activity: Goal: Risk for activity intolerance will decrease Outcome: Progressing   Problem: Coping: Goal: Level of anxiety will decrease 12/22/2020 2334 by Virgilio Belling, RN Outcome: Progressing 12/22/2020 2303 by Virgilio Belling, RN Outcome: Progressing   Problem: Pain Managment: Goal: General experience of comfort will improve 12/22/2020 2334 by Virgilio Belling, RN Outcome: Progressing 12/22/2020 2303 by Virgilio Belling, RN Outcome: Progressing   Problem: Safety: Goal: Ability to remain free from injury will improve Outcome: Progressing

## 2020-12-22 NOTE — Progress Notes (Signed)
Physical Therapy Treatment Patient Details Name: Susan Boone MRN: [id number] DOB: 08-19-1934 Today's Date: 12/22/2020   History of Present Illness Pt is an 86 year old female s/p Rt DA THA.  PMHx: venous insufficiency    PT Comments    Pt very cooperative and progressing with mobility but requiring increased time for all tasks and continues to struggle with bed mobility.   Recommendations for follow up therapy are one component of a multi-disciplinary discharge planning process, led by the attending physician.  Recommendations may be updated based on patient status, additional functional criteria and insurance authorization.  Follow Up Recommendations  Follow surgeon's recommendation for DC plan and follow-up therapies     Equipment Recommendations  Rolling walker with 5" wheels    Recommendations for Other Services       Precautions / Restrictions Precautions Precautions: Fall Restrictions Weight Bearing Restrictions: No RLE Weight Bearing: Weight bearing as tolerated     Mobility  Bed Mobility Overal bed mobility: Needs Assistance Bed Mobility: Supine to Sit     Supine to sit: Supervision;HOB elevated     General bed mobility comments: Increased time with max cues for sequencing.  Encouraged not to use rails but insisting on same "I can get one of those rails that goes under the mattress".  Utilized gait belt for AAROM R leg.    Transfers Overall transfer level: Needs assistance Equipment used: Rolling walker (2 wheeled) Transfers: Sit to/from Stand Sit to Stand: Min guard         General transfer comment: cues for LE management and use of UEs to self assist  Ambulation/Gait Ambulation/Gait assistance: Min guard Gait Distance (Feet): 100 Feet Assistive device: Rolling walker (2 wheeled) Gait Pattern/deviations: Step-to pattern;Decreased weight shift to right Gait velocity: decreased   General Gait Details: cues for posture, position from RW and  initial sequence   Stairs             Wheelchair Mobility    Modified Rankin (Stroke Patients Only)       Balance Overall balance assessment: Needs assistance Sitting-balance support: No upper extremity supported Sitting balance-Leahy Scale: Good     Standing balance support: Single extremity supported Standing balance-Leahy Scale: Poor                              Cognition Arousal/Alertness: Awake/alert Behavior During Therapy: Anxious Overall Cognitive Status: Within Functional Limits for tasks assessed                                        Exercises Total Joint Exercises Ankle Circles/Pumps: AROM;Both;20 reps Quad Sets: AROM;Both;10 reps;Supine Heel Slides: AAROM;Right;20 reps;Supine Hip ABduction/ADduction: AAROM;Right;15 reps;Supine Long Arc Quad: AROM;Right;10 reps;Seated Other Exercises Other Exercises: mod cues for all exercise correct form    General Comments        Pertinent Vitals/Pain Pain Assessment: Faces Faces Pain Scale: Hurts a little bit Pain Location: right hip Pain Descriptors / Indicators: Sore;Aching Pain Intervention(s): Limited activity within patient's tolerance;Monitored during session;Premedicated before session;Ice applied    Home Living                      Prior Function            PT Goals (current goals can now be found in the care plan section) Acute Rehab  PT Goals PT Goal Formulation: With patient Time For Goal Achievement: 12/25/20 Potential to Achieve Goals: Good Progress towards PT goals: Progressing toward goals    Frequency    7X/week      PT Plan Current plan remains appropriate    Co-evaluation              AM-PAC PT "6 Clicks" Mobility   Outcome Measure  Help needed turning from your back to your side while in a flat bed without using bedrails?: A Little Help needed moving from lying on your back to sitting on the side of a flat bed without using  bedrails?: A Lot Help needed moving to and from a bed to a chair (including a wheelchair)?: A Little Help needed standing up from a chair using your arms (e.g., wheelchair or bedside chair)?: A Little Help needed to walk in hospital room?: A Little Help needed climbing 3-5 steps with a railing? : A Lot 6 Click Score: 16    End of Session Equipment Utilized During Treatment: Gait belt Activity Tolerance: Patient tolerated treatment well Patient left: with call bell/phone within reach;with chair alarm set;in chair;with family/visitor present Nurse Communication: Mobility status PT Visit Diagnosis: Other abnormalities of gait and mobility (R26.89)     Time: 8403-7543 PT Time Calculation (min) (ACUTE ONLY): 47 min  Charges:  $Gait Training: 8-22 mins $Therapeutic Exercise: 8-22 mins $Therapeutic Activity: 8-22 mins                     Mauro Kaufmann PT Acute Rehabilitation Services Pager [phone] Office [phone]    Sarkis Rhines 12/22/2020, 12:38 PM

## 2020-12-23 ENCOUNTER — Ambulatory Visit: Payer: Medicare Other | Admitting: Nurse Practitioner

## 2020-12-23 DIAGNOSIS — M1611 Unilateral primary osteoarthritis, right hip: Secondary | ICD-10-CM | POA: Diagnosis not present

## 2020-12-23 LAB — RESP PANEL BY RT-PCR (FLU A&B, COVID) ARPGX2
Influenza A by PCR: NEGATIVE
Influenza B by PCR: NEGATIVE
SARS Coronavirus 2 by RT PCR: NEGATIVE

## 2020-12-23 MED ORDER — CEPHALEXIN 500 MG PO CAPS: 500.0000 mg | ORAL_CAPSULE | Freq: Four times a day (QID) | ORAL | 0 refills | Status: AC

## 2020-12-23 MED ORDER — METHOCARBAMOL 500 MG PO TABS: 500.0000 mg | ORAL_TABLET | Freq: Four times a day (QID) | ORAL | 0 refills | Status: DC | PRN

## 2020-12-23 MED ORDER — POLYETHYLENE GLYCOL 3350 17 G PO PACK: 17.0000 g | PACK | Freq: Every day | ORAL | 0 refills | Status: DC | PRN

## 2020-12-23 MED ORDER — DOCUSATE SODIUM 100 MG PO CAPS: 100.0000 mg | ORAL_CAPSULE | Freq: Two times a day (BID) | ORAL | 0 refills | Status: DC

## 2020-12-23 MED ORDER — ASPIRIN 81 MG PO CHEW: 81.0000 mg | CHEWABLE_TABLET | Freq: Two times a day (BID) | ORAL | 0 refills | Status: AC

## 2020-12-23 MED ORDER — HYDROCODONE-ACETAMINOPHEN 5-325 MG PO TABS: 1.0000 | ORAL_TABLET | ORAL | 0 refills | Status: DC | PRN

## 2020-12-23 NOTE — Progress Notes (Signed)
Patient ID: Susan Boone, female   DOB: 01/19/1935, 86 y.o.   MRN: [id number] Subjective: 6 Days Post-Op Procedure(s) (LRB): TOTAL HIP ARTHROPLASTY ANTERIOR APPROACH (Right)    Patient reports pain as mild to moderate. Making some slow progress, she is happy to see  Objective:   VITALS:   Vitals:   12/22/20 2057 12/23/20 0439  BP: (!) 150/46 (!) 156/67  Pulse: 68 (!) 57  Resp:    Temp: 98.3 F (36.8 C) 97.9 F (36.6 C)  SpO2: 98% 97%    Neurovascular intact Incision: dressing C/D/I  LABS No results for input(s): HGB, HCT, WBC, PLT in the last 72 hours.  No results for input(s): NA, K, BUN, CREATININE, GLUCOSE in the last 72 hours.  No results for input(s): LABPT, INR in the last 72 hours.   Assessment/Plan: 6 Days Post-Op Procedure(s) (LRB): TOTAL HIP ARTHROPLASTY ANTERIOR APPROACH (Right)   Up with therapy Work with [facility] team to arrange for disposition

## 2020-12-23 NOTE — TOC Progression Note (Signed)
Transition of Care [facility]) - Progression Note    Patient Details  Name: Susan Boone MRN: [id number] Date of Birth: 31-Jan-1935  [facility]) CM/SW Contact  Amada Jupiter, LCSW Phone Number: 12/23/2020, 3:59 PM  Clinical Narrative:    Pt has accepted bed at [facility] and insurance auth begun, however, still pending decision as of 4pm.  Facility wants to hold admission until tomorrow.  Have alerted RN/PA/MD and patient.     Barriers to Discharge: No Barriers Identified  Expected Discharge Plan and Services           Expected Discharge Date: 12/24/20               DME Arranged: Dan Humphreys rolling DME Agency: [employer]     Representative spoke with at DME Agency: Prearranged in orthopedist's office             Social Determinants of Health (SDOH) Interventions    Readmission Risk Interventions No flowsheet data found.

## 2020-12-23 NOTE — Discharge Summary (Addendum)
Physician Discharge Summary   Patient ID: Susan Boone MRN: [id number] DOB/AGE: 04/18/1934 86 y.o.  Admit date: 12/17/2020 Discharge date: 12/23/20  Primary Diagnosis: Right  hip osteoarthritis.   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 Wilburn Mylar   Discharge Diagnoses:   Active Problems:   S/P right total hip arthroplasty   S/P total right hip arthroplasty  Estimated body mass index is 35.3 kg/m as calculated from the following:   Height as of this encounter: 5\' 2"  (1.575 m).   Weight as of this encounter: 87.5 kg.  Procedure:  Procedure(s) (LRB): TOTAL HIP ARTHROPLASTY ANTERIOR APPROACH (Right)   Consults: None  HPI: Susan Boone is a 86 y.o. female who had   presented to office for evaluation of right hip pain.  Radiographs revealed   progressive degenerative changes with bone-on-bone   articulation of the  hip joint, including subchondral cystic changes and osteophytes.  The patient had painful limited range of   motion significantly affecting their overall quality of life and function.  The patient was failing to    respond to conservative measures including medications and/or  injections and activity modification and at this point was ready   to proceed with more definitive measures.  Consent was obtained for   benefit of pain relief.  Specific risks of infection, DVT, component   failure, dislocation, neurovascular injury, and need for revision surgery were reviewed in the office as well discussion of   the anterior versus posterior approach were reviewed.  Laboratory Data: Admission on 12/17/2020  Component Date Value Ref Range Status   ABO/RH(D) 12/17/2020    Final                   Value:A POS Performed at [facility], [address], [place], [place]    WBC 12/18/2020 8.5  4.0 - 10.5 K/uL Final   RBC 12/18/2020 3.90  3.87 - 5.11 MIL/uL Final   Hemoglobin 12/18/2020 11.0 (A) 12.0 - 15.0 g/dL Final   HCT 12/20/2020 34.6 (A) 36.0 - 46.0 % Final   MCV 12/18/2020 88.7  80.0 - 100.0 fL Final   MCH 12/18/2020 28.2  26.0 - 34.0 pg Final   MCHC 12/18/2020 31.8  30.0 - 36.0 g/dL Final   RDW 12/20/2020 14.7  11.5 - 15.5 % Final   Platelets 12/18/2020 122 (A) 150 - 400 K/uL Final   nRBC 12/18/2020 0.0  0.0 - 0.2 % Final   Performed at [facility], [address], [place], [place]   Sodium 12/18/2020 138  135 - 145 mmol/L Final   Potassium 12/18/2020 3.9  3.5 - 5.1 mmol/L Final   Chloride 12/18/2020 106  98 - 111 mmol/L Final   CO2 12/18/2020 25  22 - 32 mmol/L Final   Glucose, Bld 12/18/2020 187 (A) 70 - 99 mg/dL Final   Glucose reference range applies only to samples taken after fasting for at least 8 hours.   BUN 12/18/2020 13  8 - 23 mg/dL Final   Creatinine, Ser 12/18/2020 0.67  0.44 - 1.00 mg/dL Final   Calcium 33/82/5053 8.2 (A) 8.9 - 10.3 mg/dL Final   GFR, Estimated 12/18/2020 >60  >60 mL/min Final   Comment: (NOTE) Calculated using the CKD-EPI Creatinine Equation (2021)    Anion gap 12/18/2020 7  5 - 15 Final   Performed at [facility], [address], [place],  [place] [address]   WBC 12/19/2020 14.0 (A) 4.0 - 10.5 K/uL Final   RBC 12/19/2020 3.69 (A) 3.87 - 5.11 MIL/uL Final   Hemoglobin 12/19/2020 10.4 (A) 12.0 - 15.0 g/dL Final   HCT 41/93/7902 32.3 (A) 36.0 - 46.0 % Final   MCV 12/19/2020 87.5  80.0 - 100.0 fL Final   MCH 12/19/2020 28.2  26.0 - 34.0 pg Final   MCHC 12/19/2020 32.2  30.0 - 36.0 g/dL Final   RDW 40/97/3532 14.7  11.5 - 15.5 % Final   Platelets 12/19/2020 117 (A) 150 - 400 K/uL Final   Comment: Immature Platelet Fraction may be clinically indicated, consider ordering this additional test [id number] PLATELET COUNT CONFIRMED BY SMEAR    nRBC 12/19/2020 0.0  0.0 - 0.2 % Final   Performed at [facility], [address] [place], [place] [address]   SARS Coronavirus 2 by RT PCR 12/23/2020 NEGATIVE  NEGATIVE Final   Comment: (NOTE) SARS-CoV-2 target nucleic acids are NOT DETECTED.  The SARS-CoV-2 RNA is generally detectable in upper respiratory specimens during the acute phase of infection. The lowest concentration of SARS-CoV-2 viral copies this assay can detect is 138 copies/mL. A negative result does not preclude SARS-Cov-2 infection and should not be used as the sole basis for treatment or other patient management decisions. A negative result may occur with  improper specimen collection/handling, submission of specimen other than nasopharyngeal swab, presence of viral mutation(s) within the areas targeted by this assay, and inadequate number of viral copies(<138 copies/mL). A negative result must be combined with clinical observations, patient history, and epidemiological information. The expected result is Negative.  Fact Sheet for Patients:  BloggerCourse.com  Fact Sheet for Healthcare Providers:  SeriousBroker.it  This test is no                          t yet approved or cleared by the [place] FDA and  has been authorized for detection and/or  diagnosis of SARS-CoV-2 by FDA under an Emergency Use Authorization (EUA). This EUA will remain  in effect (meaning this test can be used) for the duration of the COVID-19 declaration under Section 564(b)(1) of the Act, 21 U.S.C.section 360bbb-3(b)(1), unless the authorization is terminated  or revoked sooner.       Influenza A by PCR 12/23/2020 NEGATIVE  NEGATIVE Final   Influenza B by PCR 12/23/2020 NEGATIVE  NEGATIVE Final   Comment: (NOTE) The Xpert Xpress SARS-CoV-2/FLU/RSV plus assay is intended as an aid in the diagnosis of influenza from Nasopharyngeal swab specimens and should not be used as a  sole basis for treatment. Nasal washings and aspirates are unacceptable for Xpert Xpress SARS-CoV-2/FLU/RSV testing.  Fact Sheet for Patients: BloggerCourse.com  Fact Sheet for Healthcare Providers: SeriousBroker.it  This test is not yet approved or cleared by the [place] FDA and has been authorized for detection and/or diagnosis of SARS-CoV-2 by FDA under an Emergency Use Authorization (EUA). This EUA will remain in effect (meaning this test can be used) for the duration of the COVID-19 declaration under Section 564(b)(1) of the Act, 21 U.S.C. section 360bbb-3(b)(1), unless the authorization is terminated or revoked.  Performed at [facility], [address], [place], [place] [address]   Orders Only on 12/13/2020  Component Date Value Ref Range Status   SARS Coronavirus 2 12/13/2020 RESULT: NEGATIVE   Final   Comment: RESULT: NEGATIVESARS-CoV-2 INTERPRETATION:A NEGATIVE  test result means that SARS-CoV-2 RNA was not present in the specimen above the limit of detection of this test. This does not preclude a possible SARS-CoV-2 infection and should not be used as the  sole basis for patient management decisions. Negative results must be combined with clinical observations, patient history, and  epidemiological information. Optimum specimen types and timing for peak viral levels during infections caused by SARS-CoV-2  have not been determined. Collection of multiple specimens or types of specimens may be necessary to detect virus. Improper specimen collection and handling, sequence variability under primers/probes, or organism present below the limit of detection may  lead to false negative results. Positive and negative predictive values of testing are highly dependent on prevalence. False negative test results are more likely when prevalence of disease is high.The expected result is NEGATIVE.Fact S                          heet for  Healthcare Providers: CollegeCustoms.gl Sheet for Patients: https://poole-freeman.org/ Reference Range - Negative   Hospital Outpatient Visit on 12/06/2020  Component Date Value Ref Range Status   MRSA, PCR 12/06/2020 NEGATIVE  NEGATIVE Final   Staphylococcus aureus 12/06/2020 NEGATIVE  NEGATIVE Final   Comment: (NOTE) The Xpert SA Assay (FDA approved for NASAL specimens in patients 43 years of age and older), is one component of a comprehensive surveillance program. It is not intended to diagnose infection nor to guide or monitor treatment. Performed at [facility], [address], [place], [place] [address]    WBC 12/06/2020 6.5  4.0 - 10.5 K/uL Final   RBC 12/06/2020 4.86  3.87 - 5.11 MIL/uL Final   Hemoglobin 12/06/2020 13.6  12.0 - 15.0 g/dL Final   HCT 09/81/1914 43.6  36.0 - 46.0 % Final   MCV 12/06/2020 89.7  80.0 - 100.0 fL Final   MCH 12/06/2020 28.0  26.0 - 34.0 pg Final   MCHC 12/06/2020 31.2  30.0 - 36.0 g/dL Final   RDW 78/29/5621 14.8  11.5 - 15.5 % Final   Platelets 12/06/2020 171  150 - 400 K/uL Final   nRBC 12/06/2020 0.0  0.0 - 0.2 % Final   Performed at [facility], [address] [place], [place] [address]   Sodium 12/06/2020 140  135 -  145 mmol/L Final   Potassium 12/06/2020 4.5  3.5 - 5.1 mmol/L Final   Chloride 12/06/2020 108  98 - 111 mmol/L Final   CO2 12/06/2020 27  22 - 32 mmol/L Final   Glucose, Bld 12/06/2020 113 (A) 70 - 99 mg/dL Final   Glucose reference range applies  only to samples taken after fasting for at least 8 hours.   BUN 12/06/2020 17  8 - 23 mg/dL Final   Creatinine, Ser 12/06/2020 0.51  0.44 - 1.00 mg/dL Final   Calcium 15/17/6160 9.0  8.9 - 10.3 mg/dL Final   Total Protein 73/71/0626 6.5  6.5 - 8.1 g/dL Final   Albumin 94/85/4627 3.8  3.5 - 5.0 g/dL Final   AST 03/50/0938 19  15 - 41 U/L Final   ALT 12/06/2020 12  0 - 44 U/L Final   Alkaline Phosphatase 12/06/2020 44  38 - 126 U/L Final   Total Bilirubin 12/06/2020 0.8  0.3 - 1.2 mg/dL Final   GFR, Estimated 12/06/2020 >60  >60 mL/min Final   Comment: (NOTE) Calculated using the CKD-EPI Creatinine Equation (2021)    Anion gap 12/06/2020 5  5 - 15 Final   Performed at [facility], [address] [place], [place] [address]   ABO/RH(D) 12/06/2020 A POS   Final   Antibody Screen 12/06/2020 NEG   Final   Sample Expiration 12/06/2020 12/20/2020,2359   Final   Extend sample reason 12/06/2020    Final                   Value:NO TRANSFUSIONS OR PREGNANCY IN THE PAST 3 MONTHS Performed at [facility], [address] [place], [place] [address]      X-Rays:DG Pelvis Portable  Result Date: 12/17/2020 CLINICAL DATA:  Status post right total hip replacement. EXAM: PORTABLE PELVIS 1-2 VIEWS COMPARISON:  None. FINDINGS: Right femoral and acetabular components are well situated. Expected postoperative changes are noted in the surrounding soft tissues. IMPRESSION: Status post right total hip arthroplasty. Electronically Signed   By: Lupita Raider M.D.   On: 12/17/2020 15:38   DG C-Arm 1-60 Min-No Report  Result Date: 12/17/2020 Fluoroscopy was utilized by the requesting physician.  No radiographic interpretation.    DG HIP OPERATIVE UNILAT W OR W/O PELVIS RIGHT  Result Date: 12/17/2020 CLINICAL DATA:  Right hip replacement EXAM: OPERATIVE right HIP (WITH PELVIS IF PERFORMED) 3 VIEWS TECHNIQUE: Fluoroscopic spot image(s) were submitted for interpretation post-operatively. COMPARISON:  None. FINDINGS: Three low resolution intraoperative spot views of the right hip. Total fluoroscopy time was 20 seconds. The images demonstrate a right hip replacement. The femoral head component is not clearly visualized. The alignment appears within normal limits IMPRESSION: Intraoperative fluoroscopic assistance provided during right hip replacement Electronically Signed   By: Jasmine Pang M.D.   On: 12/17/2020 15:57    EKG: Orders placed or performed in visit on 09/27/20   EKG 12-Lead     Hospital Course: Susan Boone is a 86 y.o. who was admitted to [facility]. They were brought to the operating room on 12/17/2020 and underwent Procedure(s): TOTAL HIP ARTHROPLASTY ANTERIOR APPROACH.  Patient tolerated the procedure well and was later transferred to the recovery room and then to the orthopaedic floor for postoperative care. They were given PO and IV analgesics for pain control following their surgery. They were given 24 hours of postoperative antibiotics of  Anti-infectives (From admission, onward)    Start     Dose/Rate Route Frequency Ordered Stop   12/23/20 0000  cephALEXin (KEFLEX) 500 MG capsule        500 mg Oral Every 6 hours 12/23/20 1250 12/25/20 2359   12/21/20 1745  nystatin (MYCOSTATIN) tablet 500,000 Units  Status:  Discontinued  500,000 Units Oral Every 8 hours 12/21/20 1652 12/21/20 1706   12/18/20 1200  cephALEXin (KEFLEX) capsule 500 mg        500 mg Oral Every 6 hours 12/18/20 0803 12/25/20 1159   12/17/20 2200  cephALEXin (KEFLEX) capsule 500 mg  Status:  Discontinued        500 mg Oral Daily at bedtime 12/17/20 1715 12/18/20 0803   12/17/20 1900  ceFAZolin (ANCEF) IVPB 2g/100 mL  premix        2 g 200 mL/hr over 30 Minutes Intravenous Every 6 hours 12/17/20 1715 12/18/20 0126   12/17/20 1030  ceFAZolin (ANCEF) IVPB 2g/100 mL premix        2 g 200 mL/hr over 30 Minutes Intravenous On call to O.R. 12/17/20 1027 12/17/20 1245      and started on DVT prophylaxis in the form of Aspirin.   PT and OT were ordered for total joint protocol. Discharge planning consulted to help with postop disposition and equipment needs. Patient had a good night on the evening of surgery. They started to get up OOB with therapy on POD #1.  Continued to work with therapy into POD #2 but was making slow progress due to general deconditioning. Continued working with PT on POD #3 and was improving, but did not feel safe for discharge home. We began making arrangements for SNF placement. Continued working with PT on POD #4 and #5 while awaiting placement.   Pt was seen during rounds on day six and was ready to go home pending progress with therapy. Pt was meeting their goals. We did not have insurance authorization for SNF. She was seen in rounds on POD #7 and was doing well. She was discharged to SNF later that day in stable condition.  Diet: Regular diet Activity: WBAT Follow-up: in 2 weeks Disposition: Skilled nursing facility Discharged Condition: good   Discharge Instructions     Call MD / Call 911   Complete by: As directed    If you experience chest pain or shortness of breath, CALL 911 and be transported to the hospital emergency room.  If you develope a fever above 101 F, pus (white drainage) or increased drainage or redness at the wound, or calf pain, call your surgeon's office.   Change dressing   Complete by: As directed    Maintain surgical dressing until follow up in the clinic. If the edges start to pull up, may reinforce with tape. If the dressing is no longer working, may remove and cover with gauze and tape, but must keep the area dry and clean.  Call with any questions or  concerns.   Constipation Prevention   Complete by: As directed    Drink plenty of fluids.  Prune juice may be helpful.  You may use a stool softener, such as Colace (over the counter) 100 mg twice a day.  Use MiraLax (over the counter) for constipation as needed.   Diet - low sodium heart healthy   Complete by: As directed    Increase activity slowly as tolerated   Complete by: As directed    Weight bearing as tolerated with assist device (walker, cane, etc) as directed, use it as long as suggested by your surgeon or therapist, typically at least 4-6 weeks.   Post-operative opioid taper instructions:   Complete by: As directed    POST-OPERATIVE OPIOID TAPER INSTRUCTIONS: It is important to wean off of your opioid medication as soon as possible. If you do  not need pain medication after your surgery it is ok to stop day one. Opioids include: Codeine, Hydrocodone(Norco, Vicodin), Oxycodone(Percocet, oxycontin) and hydromorphone amongst others.  Long term and even short term use of opiods can cause: Increased pain response Dependence Constipation Depression Respiratory depression And more.  Withdrawal symptoms can include Flu like symptoms Nausea, vomiting And more Techniques to manage these symptoms Hydrate well Eat regular healthy meals Stay active Use relaxation techniques(deep breathing, meditating, yoga) Do Not substitute Alcohol to help with tapering If you have been on opioids for less than two weeks and do not have pain than it is ok to stop all together.  Plan to wean off of opioids This plan should start within one week post op of your joint replacement. Maintain the same interval or time between taking each dose and first decrease the dose.  Cut the total daily intake of opioids by one tablet each day Next start to increase the time between doses. The last dose that should be eliminated is the evening dose.      TED hose   Complete by: As directed    Use stockings  (TED hose) for 2 weeks on both leg(s).  You may remove them at night for sleeping.      Allergies as of 12/23/2020       Reactions   Azithromycin Other (See Comments)   thrush   Latex Hives, Other (See Comments)   Lip swelling and redness   Codeine Itching   Doxycycline    Unsure of reaction    Nsaids    Stomach ulcers    Tape Rash   Latex Bandaid         Medication List     TAKE these medications    aspirin 81 MG chewable tablet Chew 1 tablet (81 mg total) by mouth 2 (two) times daily for 28 days.   cephALEXin 500 MG capsule Commonly known as: KEFLEX Take 1 capsule (500 mg total) by mouth every 6 (six) hours for 2 days. What changed:  medication strength when to take this   docusate sodium 100 MG capsule Commonly known as: COLACE Take 1 capsule (100 mg total) by mouth 2 (two) times daily.   HYDROcodone-acetaminophen 5-325 MG tablet Commonly known as: NORCO/VICODIN Take 1-2 tablets by mouth every 4 (four) hours as needed for severe pain (pain score 7-10).   methocarbamol 500 MG tablet Commonly known as: ROBAXIN Take 1 tablet (500 mg total) by mouth every 6 (six) hours as needed for muscle spasms.   nadolol 40 MG tablet Commonly known as: CORGARD TAKE 1 TABLET(40 MG) BY MOUTH DAILY What changed:  how much to take how to take this when to take this   nystatin-triamcinolone ointment Commonly known as: MYCOLOG Apply 1 application topically 2 (two) times daily. What changed:  when to take this reasons to take this   polyethylene glycol 17 g packet Commonly known as: MIRALAX / GLYCOLAX Take 17 g by mouth daily as needed for mild constipation.   rosuvastatin 5 MG tablet Commonly known as: Crestor Take 1 tablet (5 mg total) by mouth daily. What changed: when to take this   SALONPAS ARTHRITIS PAIN RELIEF EX Apply 1 patch topically daily as needed (pain).               Discharge Care Instructions  (From admission, onward)           Start      Ordered   12/23/20 0000  Change  dressing       Comments: Maintain surgical dressing until follow up in the clinic. If the edges start to pull up, may reinforce with tape. If the dressing is no longer working, may remove and cover with gauze and tape, but must keep the area dry and clean.  Call with any questions or concerns.   12/23/20 1250            Follow-up Information     Durene Romans, MD. Schedule an appointment as soon as possible for a visit in 2 week(s).   Specialty: Orthopedic Surgery Contact information: [address] [place] [address] [place] [address] [phone]                 Signed: Dennie Bible, PA-C Orthopedic Surgery 12/23/2020, 12:50 PM

## 2020-12-23 NOTE — Progress Notes (Signed)
Physical Therapy Treatment Patient Details Name: Susan Boone MRN: [id number] DOB: 1934-04-03 Today's Date: 12/23/2020   History of Present Illness Pt is an 86 year old female s/p Rt DA THA.  PMHx: venous insufficiency    PT Comments    Pt reports slow but steady progress over the weekend and awaiting word from [facility] team about her placement options.  Pt plans to d/c to SNF.      Recommendations for follow up therapy are one component of a multi-disciplinary discharge planning process, led by the attending physician.  Recommendations may be updated based on patient status, additional functional criteria and insurance authorization.  Follow Up Recommendations  Follow surgeon's recommendation for DC plan and follow-up therapies     Equipment Recommendations  Rolling walker with 5" wheels    Recommendations for Other Services       Precautions / Restrictions Precautions Precautions: Fall Restrictions Weight Bearing Restrictions: No RLE Weight Bearing: Weight bearing as tolerated     Mobility  Bed Mobility Overal bed mobility: Needs Assistance Bed Mobility: Sit to Supine       Sit to supine: Supervision;HOB elevated   General bed mobility comments: Increased time with cues for sequencing and with limited use of bed rails    Transfers Overall transfer level: Needs assistance Equipment used: Rolling walker (2 wheeled) Transfers: Sit to/from Stand Sit to Stand: Min guard         General transfer comment: verbal cues for use of UEs to self assist  Ambulation/Gait Ambulation/Gait assistance: Min guard;Supervision Gait Distance (Feet): 75 Feet Assistive device: Rolling walker (2 wheeled) Gait Pattern/deviations: Step-to pattern;Decreased weight shift to right Gait velocity: decreased   General Gait Details: multiple cues for upright posture   Stairs             Wheelchair Mobility    Modified Rankin (Stroke Patients Only)       Balance                                             Cognition Arousal/Alertness: Awake/alert Behavior During Therapy: WFL for tasks assessed/performed Overall Cognitive Status: Within Functional Limits for tasks assessed                                        Exercises      General Comments        Pertinent Vitals/Pain Pain Assessment: 0-10 Pain Score: 3  Pain Location: right hip with activity Pain Descriptors / Indicators: Sore;Aching Pain Intervention(s): Repositioned;Monitored during session    Home Living                      Prior Function            PT Goals (current goals can now be found in the care plan section) Progress towards PT goals: Progressing toward goals    Frequency    7X/week      PT Plan Current plan remains appropriate    Co-evaluation              AM-PAC PT "6 Clicks" Mobility   Outcome Measure  Help needed turning from your back to your side while in a flat bed without using bedrails?: A Little Help needed moving from lying  on your back to sitting on the side of a flat bed without using bedrails?: A Little Help needed moving to and from a bed to a chair (including a wheelchair)?: A Little Help needed standing up from a chair using your arms (e.g., wheelchair or bedside chair)?: A Little Help needed to walk in hospital room?: A Little Help needed climbing 3-5 steps with a railing? : A Lot 6 Click Score: 17    End of Session Equipment Utilized During Treatment: Gait belt Activity Tolerance: Patient tolerated treatment well Patient left: in bed;with call bell/phone within reach;with family/visitor present Nurse Communication: Mobility status PT Visit Diagnosis: Other abnormalities of gait and mobility (R26.89)     Time: 4315-4008 PT Time Calculation (min) (ACUTE ONLY): 27 min  Charges:  $Gait Training: 23-37 mins                    Thomasene Mohair PT, DPT Acute Rehabilitation Services Pager:  [phone] Office: [phone]    Susan Boone 12/23/2020, 1:16 PM

## 2020-12-24 DIAGNOSIS — M1611 Unilateral primary osteoarthritis, right hip: Secondary | ICD-10-CM | POA: Diagnosis not present

## 2020-12-24 NOTE — TOC Transition Note (Signed)
Transition of Care [facility]) - CM/SW Discharge Note   Patient Details  Name: Susan Boone MRN: [id number] Date of Birth: 1934/10/12  Transition of Care [facility]) CM/SW Contact:  Amada Jupiter, LCSW Phone Number: 12/24/2020, 9:24 AM   Clinical Narrative:    Have received insurance auth and pt medically ready for discharge to [facility] SNF today.  PTAR called at 9:15am.  RN to call report to [phone] (ask for "Ann").  No further TOC needs.   Final next level of care: Skilled Nursing Facility Barriers to Discharge: Barriers Resolved   Patient Goals and CMS Choice Patient states their goals for this hospitalization and ongoing recovery are:: to return home following SNF CMS Medicare.gov Compare Post Acute Care list provided to:: Patient Choice offered to / list presented to : Patient  Discharge Placement PASRR number recieved: 12/21/20            Patient chooses bed at: [facility] Patient to be transferred to facility by: PTAR Name of family member notified: significant other Patient and family notified of of transfer: 12/24/20  Discharge Plan and Services                DME Arranged: Dan Humphreys rolling DME Agency: [employer]     Representative spoke with at DME Agency: Prearranged in orthopedist's office            Social Determinants of Health (SDOH) Interventions     Readmission Risk Interventions No flowsheet data found.

## 2020-12-24 NOTE — Progress Notes (Signed)
Physical Therapy Treatment Patient Details Name: Susan Boone MRN: [id number] DOB: 1934-12-24 Today's Date: 12/24/2020   History of Present Illness Pt is an 86 year old female s/p Rt DA THA.  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  Required positive reinforcement and increased time to address all her issues. Assisted out of recliner to amb to bathroom.  General transfer comment: 50% VC's on safetyand proper walker use with turns when in bathroom.  Pt distracted by her emotional state.General Gait Details: 75% VC's on proper walker to self distance and sequencing.  Pt declined to amb in hallway stating she was too distraught and she needed to lay down.  Pt only amb to and from bathroom.  Then assisted back to bed per pt request.   Pt plans to D/C to SNF today.   Recommendations for follow up therapy are one component of a multi-disciplinary discharge planning process, led by the attending physician.  Recommendations may be updated based on patient status, additional functional criteria and insurance authorization.  Follow Up Recommendations  Follow surgeon's recommendation for DC plan and follow-up therapies     Equipment Recommendations       Recommendations for Other Services       Precautions / Restrictions Precautions Precautions: Fall Restrictions Weight Bearing Restrictions: No Other Position/Activity Restrictions: WBAT     Mobility  Bed Mobility               General bed mobility comments: OOB in recliner    Transfers Overall transfer level: Needs assistance Equipment used: Rolling walker (2 wheeled) Transfers: Sit to/from Stand Sit to Stand: Min  guard Stand pivot transfers: Min assist       General transfer comment: 50% VC's on safetyand proper walker use with turns when in bathroom.  Pt distracted by her emotional state.  Ambulation/Gait Ambulation/Gait assistance: Min guard;Supervision Gait Distance (Feet): 18 Feet Assistive device: Rolling walker (2 wheeled) Gait Pattern/deviations: Step-to pattern;Decreased weight shift to right Gait velocity: decreased   General Gait Details: 75% VC's on proper walker to self distance and sequencing.  Pt declined to amb in hallway stating she was too distraught and she needed to lay down.  Pt only amb to and from bathroom.   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  Required positive reinforcement and increased time to address all her issues.      Exercises      General Comments        Pertinent Vitals/Pain Pain Assessment: 0-10 Pain Score: 8  Pain Location: right hip with activity Pain Descriptors / Indicators: Sore;Aching;Discomfort;Tender;Operative site guarding Pain Intervention(s): Monitored during session;Repositioned;Ice applied;Premedicated before session    Home Living                      Prior Function            PT Goals (current goals can now be found in the care plan section) Progress towards PT goals: Progressing toward  goals    Frequency    7X/week      PT Plan Current plan remains appropriate    Co-evaluation              AM-PAC PT "6 Clicks" Mobility   Outcome Measure  Help needed turning from your back to your side while in a flat bed without using bedrails?: A Little Help needed moving from lying on your back to sitting on the side of a flat bed without using bedrails?: A Little Help needed moving to and from a bed to a chair (including a wheelchair)?: A Little Help needed standing up from a chair using your arms (e.g., wheelchair or bedside chair)?: A Little Help needed to walk in hospital room?: A Little Help needed climbing 3-5 steps with a railing? : A Little 6 Click Score: 18    End of Session Equipment Utilized During Treatment: Gait belt Activity Tolerance: Patient tolerated treatment well Patient left: in bed;with call bell/phone within reach;with family/visitor present Nurse Communication: Mobility status PT Visit Diagnosis: Other abnormalities of gait and mobility (R26.89)     Time: 0940-1005 PT Time Calculation (min) (ACUTE ONLY): 25 min  Charges:  $Gait Training: 8-22 mins $Therapeutic Activity: 8-22 mins                     {Olga Seyler  PTA Acute  Rehabilitation Services Pager      [phone] Office      [phone]

## 2020-12-24 NOTE — Progress Notes (Signed)
   Subjective: 7 Days Post-Op Procedure(s) (LRB): TOTAL HIP ARTHROPLASTY ANTERIOR APPROACH (Right) Patient reports pain as mild.   Patient seen in rounds for Dr. Charlann Boxer. Patient is well, and has had no acute complaints or problems. She is ready for discharge to SNF today.  We will continue  therapy today.   Objective: Vital signs in last 24 hours: Temp:  [98.3 F (36.8 C)-98.4 F (36.9 C)] 98.4 F (36.9 C) (10/03 2159) Pulse Rate:  [59-65] 65 (10/03 2159) Resp:  [18-21] 21 (10/03 2159) BP: (104-155)/(52-60) 155/60 (10/03 2159) SpO2:  [96 %] 96 % (10/03 2159)  Intake/Output from previous day:  Intake/Output Summary (Last 24 hours) at 12/24/2020 0725 Last data filed at 12/24/2020 0600 Gross per 24 hour  Intake 1377 ml  Output 2900 ml  Net -1523 ml     Intake/Output this shift: No intake/output data recorded.  Labs: No results for input(s): HGB in the last 72 hours. No results for input(s): WBC, RBC, HCT, PLT in the last 72 hours. No results for input(s): NA, K, CL, CO2, BUN, CREATININE, GLUCOSE, CALCIUM in the last 72 hours. No results for input(s): LABPT, INR in the last 72 hours.  Exam: General - Patient is Alert and Oriented Extremity - Neurologically intact Sensation intact distally Intact pulses distally Dorsiflexion/Plantar flexion intact Dressing - dressing C/D/I Motor Function - intact, moving foot and toes well on exam.   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 Wilburn Mylar    Assessment/Plan: 7 Days Post-Op Procedure(s) (LRB): TOTAL HIP ARTHROPLASTY ANTERIOR APPROACH (Right) Active Problems:   S/P right total hip arthroplasty   S/P total right hip arthroplasty  Estimated body mass index is 35.3 kg/m as calculated from the following:   Height as of this encounter: 5\' 2"  (1.575 m).   Weight as of this encounter: 87.5 kg. Advance diet Up with therapy D/C IV fluids Discharge to SNF  DVT Prophylaxis - Aspirin Weight bearing as tolerated.  Plan for discharge to SNF today. Rx printed. Follow up on 10/12.   12/12, PA-C Orthopedic Surgery [phone] 12/24/2020, 7:25 AM

## 2020-12-25 ENCOUNTER — Ambulatory Visit: Payer: Medicare Other | Admitting: Nurse Practitioner

## 2021-03-13 ENCOUNTER — Other Ambulatory Visit: Payer: Self-pay | Admitting: Nurse Practitioner

## 2021-03-18 ENCOUNTER — Telehealth: Payer: Self-pay | Admitting: Nurse Practitioner

## 2021-03-18 NOTE — Telephone Encounter (Signed)
Left vm for patient to call back and schedule next AWV °

## 2021-03-20 ENCOUNTER — Other Ambulatory Visit: Payer: Self-pay | Admitting: Nurse Practitioner

## 2021-03-20 ENCOUNTER — Other Ambulatory Visit: Payer: Self-pay | Admitting: *Deleted

## 2021-03-20 DIAGNOSIS — E782 Mixed hyperlipidemia: Secondary | ICD-10-CM

## 2021-03-20 MED ORDER — ROSUVASTATIN CALCIUM 5 MG PO TABS: 5.0000 mg | ORAL_TABLET | Freq: Every day | ORAL | 0 refills | Status: DC

## 2021-03-20 NOTE — Telephone Encounter (Signed)
Careers adviser refill.

## 2021-04-20 ENCOUNTER — Other Ambulatory Visit: Payer: Self-pay | Admitting: Nurse Practitioner

## 2021-04-20 DIAGNOSIS — E782 Mixed hyperlipidemia: Secondary | ICD-10-CM

## 2021-04-21 ENCOUNTER — Encounter: Payer: Self-pay | Admitting: Nurse Practitioner

## 2021-04-21 ENCOUNTER — Ambulatory Visit (INDEPENDENT_AMBULATORY_CARE_PROVIDER_SITE_OTHER): Payer: Medicare Other | Admitting: Nurse Practitioner

## 2021-04-21 ENCOUNTER — Other Ambulatory Visit: Payer: Self-pay

## 2021-04-21 VITALS — BP 132/80 | HR 61 | Temp 97.1°F | Ht 62.0 in | Wt 189.0 lb

## 2021-04-21 DIAGNOSIS — R600 Localized edema: Secondary | ICD-10-CM

## 2021-04-21 DIAGNOSIS — I10 Essential (primary) hypertension: Secondary | ICD-10-CM

## 2021-04-21 DIAGNOSIS — E782 Mixed hyperlipidemia: Secondary | ICD-10-CM | POA: Diagnosis not present

## 2021-04-21 DIAGNOSIS — E6609 Other obesity due to excess calories: Secondary | ICD-10-CM

## 2021-04-21 DIAGNOSIS — R739 Hyperglycemia, unspecified: Secondary | ICD-10-CM

## 2021-04-21 DIAGNOSIS — M159 Polyosteoarthritis, unspecified: Secondary | ICD-10-CM

## 2021-04-21 DIAGNOSIS — I872 Venous insufficiency (chronic) (peripheral): Secondary | ICD-10-CM

## 2021-04-21 DIAGNOSIS — Z6834 Body mass index (BMI) 34.0-34.9, adult: Secondary | ICD-10-CM

## 2021-04-21 DIAGNOSIS — M150 Primary generalized (osteo)arthritis: Secondary | ICD-10-CM

## 2021-04-21 DIAGNOSIS — Z87442 Personal history of urinary calculi: Secondary | ICD-10-CM

## 2021-04-21 DIAGNOSIS — N390 Urinary tract infection, site not specified: Secondary | ICD-10-CM

## 2021-04-21 LAB — COMPLETE METABOLIC PANEL WITH GFR
AG Ratio: 1.6 (calc) (ref 1.0–2.5)
ALT: 9 U/L (ref 6–29)
AST: 16 U/L (ref 10–35)
Albumin: 3.9 g/dL (ref 3.6–5.1)
Alkaline phosphatase (APISO): 56 U/L (ref 37–153)
BUN: 14 mg/dL (ref 7–25)
CO2: 33 mmol/L — ABNORMAL HIGH (ref 20–32)
Calcium: 9.2 mg/dL (ref 8.6–10.4)
Chloride: 107 mmol/L (ref 98–110)
Creat: 0.7 mg/dL (ref 0.60–0.95)
Globulin: 2.4 g/dL (calc) (ref 1.9–3.7)
Glucose, Bld: 113 mg/dL — ABNORMAL HIGH (ref 65–99)
Potassium: 4.2 mmol/L (ref 3.5–5.3)
Sodium: 143 mmol/L (ref 135–146)
Total Bilirubin: 0.8 mg/dL (ref 0.2–1.2)
Total Protein: 6.3 g/dL (ref 6.1–8.1)
eGFR: 85 mL/min/{1.73_m2} (ref >=60)

## 2021-04-21 LAB — CBC WITH DIFFERENTIAL/PLATELET
Absolute Monocytes: 706 cells/uL (ref 200–950)
Basophils Absolute: 29 cells/uL (ref 0–200)
Basophils Relative: 0.4 %
Eosinophils Absolute: 137 cells/uL (ref 15–500)
Eosinophils Relative: 1.9 %
HCT: 43 % (ref 35.0–45.0)
Hemoglobin: 13.7 g/dL (ref 11.7–15.5)
Lymphs Abs: 2218 cells/uL (ref 850–3900)
MCH: 27.5 pg (ref 27.0–33.0)
MCHC: 31.9 g/dL — ABNORMAL LOW (ref 32.0–36.0)
MCV: 86.3 fL (ref 80.0–100.0)
MPV: 10.4 fL (ref 7.5–12.5)
Monocytes Relative: 9.8 %
Neutro Abs: 4111 cells/uL (ref 1500–7800)
Neutrophils Relative %: 57.1 %
Platelets: 190 10*3/uL (ref 140–400)
RBC: 4.98 10*6/uL (ref 3.80–5.10)
RDW: 13.3 % (ref 11.0–15.0)
Total Lymphocyte: 30.8 %
WBC: 7.2 10*3/uL (ref 3.8–10.8)

## 2021-04-21 MED ORDER — NADOLOL 40 MG PO TABS: 40.0000 mg | ORAL_TABLET | Freq: Every day | ORAL | 1 refills | Status: DC

## 2021-04-21 MED ORDER — CEPHALEXIN 500 MG PO CAPS: 500.0000 mg | ORAL_CAPSULE | Freq: Every day | ORAL | 1 refills | Status: DC

## 2021-04-21 MED ORDER — ROSUVASTATIN CALCIUM 5 MG PO TABS: 5.0000 mg | ORAL_TABLET | Freq: Every day | ORAL | 1 refills | Status: DC

## 2021-04-21 NOTE — Patient Instructions (Signed)
Compression hose- apply in the morning and remove before bed Can go to medical supply store to get fitted.  Elevate legs has tolerate

## 2021-04-21 NOTE — Progress Notes (Signed)
Careteam: Patient Care Team: Lauree Chandler, NP as PCP - General (Geriatric Medicine) Crista Luria, MD as Attending Physician (Dermatology) Irene Limbo, MD as Consulting Physician (Plastic Surgery) Waynette Buttery, MD as Referring Physician (Urology) Early, Arvilla Meres, MD as Consulting Physician (Vascular Surgery) Gaynelle Arabian, MD as Consulting Physician (Orthopedic Surgery) Monna Fam, MD as Consulting Physician (Ophthalmology)  PLACE OF SERVICE:  [place]  Advanced Directive information    Allergies  Allergen Reactions   Azithromycin Other (See Comments)    thrush   Latex Hives and Other (See Comments)    Lip swelling and redness    Codeine Itching   Doxycycline     Unsure of reaction    Nsaids     Stomach ulcers    Tape Rash    Latex Bandaid     Chief Complaint  Patient presents with   Medical Management of Chronic Issues    6 month follow-up. Discuss need for shingrix or post pone if patient refuses.      HPI: Patient is a 87 y.o. female for routine follow up.   Had hip replacement and went very well. Now realizes how much her knees hurt. She uses tylenol PRN.   Recurrent UTI- using keflex 500 mg daily for UTI prevention from urologist. Plans to look for a new urologist. She previously went to [facility] urology and had a bad experience and now going to [facility] but her provider moved and she does not like the urologist she was assigned. following due to renal mass and nephrolithiasis. Saw prior to hip replacement and everything stable at this time. She drinks a lot of fluids to prevent worsening stones.  Incontinence of urine- deep yellow at time but makes it a point to drink plenty of fluid  Migraine headaches/hypertension- uses nadolol for  blood pressure at this time but initially was for migraines, no additional headaches.   Feet continue to be blue and some LE edema. Followed with vascular prior to hip replacement due to LE edema.    Review of Systems:  Review of Systems  Constitutional:  Negative for chills, fever and weight loss.  HENT:  Negative for tinnitus.   Respiratory:  Negative for cough, sputum production and shortness of breath.   Cardiovascular:  Positive for leg swelling. Negative for chest pain and palpitations.  Gastrointestinal:  Negative for abdominal pain, constipation, diarrhea and heartburn.  Genitourinary:  Positive for frequency. Negative for dysuria and urgency.  Musculoskeletal:  Positive for joint pain. Negative for back pain, falls and myalgias.  Skin: Negative.   Neurological:  Negative for dizziness and headaches.  Psychiatric/Behavioral:  Negative for depression and memory loss. The patient does not have insomnia.    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 Waynette Buttery   Past Surgical History:  Procedure Laterality Date   ABLATION  2005   endovenous   APPENDECTOMY     AUGMENTATION MAMMAPLASTY Bilateral 1971   [facility] Dr. Tressa Busman   CHOLECYSTECTOMY  1986   chronic diarrhea since   COSMETIC SURGERY     LITHOTRIPSY Bilateral 2006   PERCUTANEOUS NEPHROSTOLITHOTOMY Left 06/30/2011   w/ cystoscopy by Dr. Roby Lofts at [facility]  (and poss ureteral stent placement?)   PERCUTANEOUS NEPHROSTOLITHOTOMY Left 07/07/2011   w/ ureteroscopy and stone manipulation Dr. Roby Lofts at [facility]. Surg >4 hrs with gen anesthesia and pt noticed new permanent cognitive deficits after   TOTAL HIP ARTHROPLASTY Right 12/17/2020   Procedure: TOTAL HIP ARTHROPLASTY ANTERIOR APPROACH;  Surgeon: Paralee Cancel, MD;  Location: [facility] ORS;  Service: Orthopedics;  Laterality: Right;   TUBAL LIGATION Bilateral 1968   URETEROLITHOTOMY Right 12/17/2011   Dr. Roby Lofts at [facility]   URETEROSCOPY WITH HOLMIUM LASER LITHOTRIPSY Left 07/07/2011   by percutaneous nephrostolithotomy with basket extraction, ureteral stent removal (placed 06/30/11?), and placement of Lt percutaneous nephrostomy tube by Dr. Roby Lofts at [facility] for numerous >2cm staghorn calcium oxalate stones in left renal pelvis retained after initial perc nephrostolithotomy wiht stent placement 1 wk prior    Social History:   reports that she has never smoked. She has never used smokeless tobacco. She reports that she does not drink alcohol and does not use drugs.  Family History  Problem Relation Age of Onset   Stroke Father    Hyperlipidemia Father    Lung cancer Mother    Cancer Maternal Grandmother     Medications: Patient's Medications  New Prescriptions   No medications on file  Previous Medications   ACETAMINOPHEN (TYLENOL) 500 MG TABLET    Take 500 mg by mouth as needed.   DOCUSATE SODIUM (COLACE) 100 MG CAPSULE    Take 1 capsule (100 mg total) by mouth 2 (two) times daily.   LINIMENTS (SALONPAS ARTHRITIS PAIN RELIEF EX)    Apply 1 patch topically daily as needed (pain).   NADOLOL (CORGARD) 40 MG TABLET    TAKE 1 TABLET(40 MG) BY MOUTH DAILY   NYSTATIN-TRIAMCINOLONE OINTMENT (MYCOLOG)    Apply 1 application topically 2 (two) times daily.   ROSUVASTATIN (CRESTOR) 5 MG TABLET    TAKE 1 TABLET(5 MG) BY MOUTH DAILY  Modified Medications   No medications on file   Discontinued Medications   HYDROCODONE-ACETAMINOPHEN (NORCO/VICODIN) 5-325 MG TABLET    Take 1-2 tablets by mouth every 4 (four) hours as needed for severe pain (pain score 7-10).   METHOCARBAMOL (ROBAXIN) 500 MG TABLET    Take 1 tablet (500 mg total) by mouth every 6 (six) hours as needed for muscle spasms.   POLYETHYLENE GLYCOL (MIRALAX / GLYCOLAX) 17 G PACKET    Take 17 g by mouth daily as needed for mild constipation.    Physical Exam:  Vitals:   04/21/21 1619  BP: 132/80  Pulse: 61  Temp: (!) 97.1 F (36.2 C)  TempSrc: Temporal  SpO2: 98%  Weight: 189 lb (85.7 kg)  Height: '5\' 2"'  (1.575 m)   Body mass index is 34.57 kg/m. Wt Readings from Last 3 Encounters:  04/21/21 189 lb (85.7 kg)  12/17/20 193 lb (87.5 kg)  12/06/20 193 lb (87.5 kg)    Physical Exam Constitutional:  General: She is not in acute distress.    Appearance: She is well-developed. She is not diaphoretic.  HENT:     Head: Normocephalic and atraumatic.     Mouth/Throat:     Pharynx: No oropharyngeal exudate.  Eyes:     Conjunctiva/sclera: Conjunctivae normal.     Pupils: Pupils are equal, round, and reactive to light.  Cardiovascular:     Rate and Rhythm: Normal rate and regular rhythm.     Heart sounds: Normal heart sounds.  Pulmonary:     Effort: Pulmonary effort is normal.     Breath sounds: Normal breath sounds.  Abdominal:     General: Bowel sounds are normal.     Palpations: Abdomen is soft.  Musculoskeletal:     Cervical back: Normal range of motion and neck supple.     Right lower leg: Edema (trace) present.     Left lower leg: Edema (trace) present.  Skin:    General: Skin is warm and dry.     Comments: Bilateral LE blue to feet and toes, positive pulses bilaterally  Neurological:     Mental Status: She is alert.  Psychiatric:        Mood and Affect: Mood normal.    Labs reviewed: Basic Metabolic Panel: Recent Labs    09/27/20 1132 12/06/20 1513 12/18/20 0336  NA 142  140 138  K 4.0 4.5 3.9  CL 106 108 106  CO2 '28 27 25  ' GLUCOSE 100 113* 187*  BUN '19 17 13  ' CREATININE 0.75 0.51 0.67  CALCIUM 8.9 9.0 8.2*   Liver Function Tests: Recent Labs    08/20/20 1107 09/27/20 1132 12/06/20 1513  AST '18 16 19  ' ALT '10 10 12  ' ALKPHOS  --   --  44  BILITOT 0.7 0.6 0.8  PROT 6.0* 5.9* 6.5  ALBUMIN  --   --  3.8   No results for input(s): LIPASE, AMYLASE in the last 8760 hours. No results for input(s): AMMONIA in the last 8760 hours. CBC: Recent Labs    09/27/20 1132 12/06/20 1513 12/18/20 0336 12/19/20 0312  WBC 5.5 6.5 8.5 14.0*  NEUTROABS 2,910  --   --   --   HGB 13.3 13.6 11.0* 10.4*  HCT 41.5 43.6 34.6* 32.3*  MCV 89.1 89.7 88.7 87.5  PLT 163 171 122* 117*   Lipid Panel: Recent Labs    08/20/20 1107  CHOL 118  HDL 52  LDLCALC 46  TRIG 112  CHOLHDL 2.3   TSH: No results for input(s): TSH in the last 8760 hours. A1C: Lab Results  Component Value Date   HGBA1C 5.5 08/20/2020     Assessment/Plan .1. Hyperlipidemia, mixed -continue dietary modifications. - CMP with eGFR([facility]) - rosuvastatin (CRESTOR) 5 MG tablet; Take 1 tablet (5 mg total) by mouth daily.  Dispense: 90 tablet; Refill: 1  2. Hyperglycemia -will follow up glucose on labs, dietary modifications encouraged.  - CMP with eGFR([facility])  3. Primary osteoarthritis involving multiple joints With improved hip pain, continues tylenol PRN - CBC with Differential/Platelet - CMP with eGFR([facility])  4. Venous insufficiency of both lower extremities --encouraged to elevate legs above level of heart as tolerates, low sodium diet, compression hose as tolerates (on in am, off in pm) was measured for compression hose by vascular, recommended to get hose at [facility]  5. NEPHROLITHIASIS, HX OF -followed by urology, no current symptoms.   6. Primary hypertension -Blood pressure well controlled Continue current medications Recheck  metabolic panel - nadolol  (CORGARD) 40 MG tablet; Take 1 tablet (40 mg total) by mouth daily.  Dispense: 90 tablet; Refill: 1  7. Class 1 obesity due to excess calories with serious comorbidity and body mass index (BMI) of 34.0 to 34.9 in adult -education provided on healthy weight loss through increase in physical activity and proper nutrition   8. Recurrent UTI -looking for new urologist, no current symptoms.  - cephALEXin (KEFLEX) 500 MG capsule; Take 1 capsule (500 mg total) by mouth daily. For UTI prevention  Dispense: 90 capsule; Refill: 1   Return in about 4 months (around 08/19/2021) for routine visit. Carlos American. [place], [facility] [phone]

## 2021-07-11 ENCOUNTER — Other Ambulatory Visit: Payer: Self-pay | Admitting: Nurse Practitioner

## 2021-07-11 DIAGNOSIS — Z1231 Encounter for screening mammogram for malignant neoplasm of breast: Secondary | ICD-10-CM

## 2021-07-16 ENCOUNTER — Ambulatory Visit
Admission: RE | Admit: 2021-07-16 | Discharge: 2021-07-16 | Disposition: A | Discharge status: Home / Self Care | Payer: Medicare Other | Source: Ambulatory Visit | Attending: Nurse Practitioner | Admitting: Nurse Practitioner

## 2021-07-16 DIAGNOSIS — Z1231 Encounter for screening mammogram for malignant neoplasm of breast: Secondary | ICD-10-CM

## 2021-08-25 ENCOUNTER — Ambulatory Visit (INDEPENDENT_AMBULATORY_CARE_PROVIDER_SITE_OTHER): Payer: Medicare Other | Admitting: Nurse Practitioner

## 2021-08-25 ENCOUNTER — Encounter: Payer: Self-pay | Admitting: Nurse Practitioner

## 2021-08-25 VITALS — BP 134/80 | HR 61 | Temp 97.6°F | Ht 62.0 in | Wt 190.0 lb

## 2021-08-25 DIAGNOSIS — N390 Urinary tract infection, site not specified: Secondary | ICD-10-CM

## 2021-08-25 DIAGNOSIS — E782 Mixed hyperlipidemia: Secondary | ICD-10-CM | POA: Diagnosis not present

## 2021-08-25 DIAGNOSIS — E6609 Other obesity due to excess calories: Secondary | ICD-10-CM | POA: Diagnosis not present

## 2021-08-25 DIAGNOSIS — N2889 Other specified disorders of kidney and ureter: Secondary | ICD-10-CM

## 2021-08-25 DIAGNOSIS — Z6834 Body mass index (BMI) 34.0-34.9, adult: Secondary | ICD-10-CM

## 2021-08-25 DIAGNOSIS — R682 Dry mouth, unspecified: Secondary | ICD-10-CM

## 2021-08-25 DIAGNOSIS — M159 Polyosteoarthritis, unspecified: Secondary | ICD-10-CM

## 2021-08-25 NOTE — Progress Notes (Signed)
Careteam: Patient Care Team: Lauree Chandler, NP as PCP - General (Geriatric Medicine) Crista Luria, MD as Attending Physician (Dermatology) Irene Limbo, MD as Consulting Physician (Plastic Surgery) Waynette Buttery, MD as Referring Physician (Urology) Early, Arvilla Meres, MD as Consulting Physician (Vascular Surgery) Gaynelle Arabian, MD as Consulting Physician (Orthopedic Surgery) Monna Fam, MD as Consulting Physician (Ophthalmology)  PLACE OF SERVICE:  [place] Directive information Does Patient Have a Medical Advance Directive?: Yes, Type of Advance Directive: [place];Living will, Does patient want to make changes to medical advance directive?: No - Patient declined  Allergies  Allergen Reactions   Azithromycin Other (See Comments)    thrush   Latex Hives and Other (See Comments)    Lip swelling and redness    Codeine Itching   Doxycycline     Unsure of reaction    Nsaids     Stomach ulcers    Tape Rash    Latex Bandaid     Chief Complaint  Patient presents with   Medical Management of Chronic Issues    4 month follow-up and discuss need for shingrix or post pone if patient refuses. NCIR verified. Patient c/o dry eyes and dry mouth, would like to be evaluated for Sjgren's syndrome (Dr.Reed expected that patient may have this, per patient)     HPI: Patient is a 87 y.o. female for routine follow up.   Reports hips are doing well but back and knees are hurting.  She is very unsteady and needs the walker to be stable. Reports she get tired from having to work harder to get around.   She sleeps well at night. Very fatigue during the day.   She has a mass on her kidney and has an appt with Dr Bethann Punches.   She is not fasting today- continues on crestor 5 mg daily    Review of Systems:  Review of Systems  Constitutional:  Negative for chills, fever and weight loss.  HENT:  Negative for tinnitus.        Reports  significant dry mouth for ~1 year. Has to make sure she drinks or has food with moisture to swallow. Has a hard time spitting  Eyes:        Very dry eyes. Has drops from ophthalmology.  Respiratory:  Negative for cough, sputum production and shortness of breath.   Cardiovascular:  Negative for chest pain, palpitations and leg swelling.  Gastrointestinal:  Negative for abdominal pain, constipation, diarrhea and heartburn.  Genitourinary:  Negative for dysuria, frequency and urgency.  Musculoskeletal:  Positive for back pain and joint pain. Negative for falls and myalgias.  Skin: Negative.   Neurological:  Positive for weakness. Negative for dizziness and headaches.  Psychiatric/Behavioral:  Negative for depression and memory loss. The patient does not have insomnia.    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 Waynette Buttery   Past Surgical History:  Procedure Laterality Date   ABLATION  2005   endovenous   APPENDECTOMY     AUGMENTATION MAMMAPLASTY Bilateral 1971   [facility] Dr. Tressa Busman   CHOLECYSTECTOMY  1986   chronic diarrhea since   COSMETIC SURGERY     LITHOTRIPSY Bilateral 2006   PERCUTANEOUS NEPHROSTOLITHOTOMY Left 06/30/2011    w/ cystoscopy by Dr. Roby Lofts at [facility] (and poss ureteral stent placement?)   PERCUTANEOUS NEPHROSTOLITHOTOMY Left 07/07/2011   w/ ureteroscopy and stone manipulation Dr. Roby Lofts at [facility]. Surg >4 hrs with gen anesthesia and pt noticed new permanent cognitive deficits after   TOTAL HIP ARTHROPLASTY Right 12/17/2020   Procedure: TOTAL HIP ARTHROPLASTY ANTERIOR APPROACH;  Surgeon: Paralee Cancel, MD;  Location: [facility];  Service: Orthopedics;  Laterality: Right;   TUBAL LIGATION Bilateral 1968   URETEROLITHOTOMY Right 12/17/2011   Dr. Rob[facility] Lofts at [facility]   URETEROSCOPY WITH HOLMIUM LASER LITHOTRIPSY Left 07/07/2011   by percutaneous nephrostolithotomy with basket extraction, ureteral stent removal (placed 06/30/11?), and placement of Lt percutaneous nephrostomy tube by Dr. Roby Lofts at [facility] for numerous >2cm staghorn calcium oxalate stones in left renal pelvis retained after initial perc nephrostolithotomy wiht stent placement 1 wk prior    Social History:   reports that she has never smoked. She has never used smokeless tobacco. She reports that she does not drink alcohol and does not use drugs.  Family History  Problem Relation Age of Onset   Stroke Father    Hyperlipidemia Father    Lung cancer Mother    Cancer Maternal Grandmother     Medications: Patient's Medications  New Prescriptions   No medications on file  Previous Medications   ACETAMINOPHEN (TYLENOL) 500 MG TABLET    Take 500 mg by mouth as needed.   CEPHALEXIN (KEFLEX) 500 MG CAPSULE    Take 1 capsule (500 mg total) by mouth daily. For UTI prevention   DOCUSATE SODIUM (COLACE) 100 MG CAPSULE    Take 1 capsule (100 mg total) by mouth 2 (two) times daily.   LINIMENTS (SALONPAS ARTHRITIS PAIN RELIEF EX)    Apply 1 patch topically daily as needed (pain).   NADOLOL (CORGARD) 40 MG TABLET    Take 1 tablet (40 mg total) by mouth daily.   NYSTATIN-TRIAMCINOLONE OINTMENT (MYCOLOG)    Apply 1 application  topically 2 (two) times daily.   ROSUVASTATIN (CRESTOR) 5 MG TABLET    Take 1 tablet (5 mg total) by mouth daily.  Modified Medications   No medications on file  Discontinued Medications   No medications on file    Physical Exam:  Vitals:   08/25/21 1447  BP: 134/80  Pulse: 61  Temp: 97.6 F (36.4 C)  TempSrc: Temporal  SpO2: 97%  Weight: 190 lb (86.2 kg)  Height: _0  (1.575 m)   Body mass index is 34.75 kg/m. Wt Readings from Last 3 Encounters:  08/25/21 190 lb (86.2 kg)  04/21/21 189 lb (85.7 kg)  12/17/20 193 lb (87.5 kg)    Physical Exam Constitutional:      General: She is not in acute distress.    Appearance: She is well-developed. She is not diaphoretic.  HENT:  Head: Normocephalic and atraumatic.     Mouth/Throat:     Pharynx: No oropharyngeal exudate.  Eyes:     Conjunctiva/sclera: Conjunctivae normal.     Pupils: Pupils are equal, round, and reactive to light.  Cardiovascular:     Rate and Rhythm: Normal rate and regular rhythm.     Heart sounds: Normal heart sounds.  Pulmonary:     Effort: Pulmonary effort is normal.     Breath sounds: Normal breath sounds.  Abdominal:     General: Bowel sounds are normal.     Palpations: Abdomen is soft.  Musculoskeletal:     Cervical back: Normal range of motion and neck supple.     Right lower leg: Edema present.     Left lower leg: Edema present.     Comments: Chronic bluish purple coloration to bilateral LE  Skin:    General: Skin is warm and dry.  Neurological:     Mental Status: She is alert.  Psychiatric:        Mood and Affect: Mood normal.    Labs reviewed: Basic Metabolic Panel: Recent Labs    12/06/20 1513 12/18/20 0336 04/21/21 1637  NA 140 138 143  K 4.5 3.9 4.2  CL 108 106 107  CO2 27 25 33*  GLUCOSE 113* 187* 113*  BUN _0 CREATININE 0.51 0.67 0.70  CALCIUM 9.0 8.2* 9.2   Liver Function Tests: Recent Labs    09/27/20 1132 12/06/20 1513 04/21/21 1637  AST _1 ALT _2 ALKPHOS  --  44  --   BILITOT 0.6 0.8 0.8  PROT 5.9* 6.5 6.3  ALBUMIN  --  3.8  --    No results for input(s): LIPASE, AMYLASE in the last 8760 hours. No results for input(s): AMMONIA in the last 8760 hours. CBC: Recent Labs    09/27/20 1132 12/06/20 1513 12/18/20 0336 12/19/20 0312 04/21/21 1637  WBC 5.5   < > 8.5 14.0* 7.2  NEUTROABS 2,910  --   --   --  4,111  HGB 13.3   < > 11.0* 10.4* 13.7  HCT 41.5   < > 34.6* 32.3* 43.0  MCV 89.1   < > 88.7 87.5 86.3  PLT 163   < > 122* 117* 190   < > = values in this interval not displayed.   Lipid Panel: No results for input(s): CHOL, HDL, LDLCALC, TRIG, CHOLHDL, LDLDIRECT in the last 8760 hours. TSH: No results for input(s): TSH in the last 8760 hours. A1C: Lab Results  Component Value Date   HGBA1C 5.5 08/20/2020     Assessment/Plan 1. Dry mouth -chronic dry mouth and dry eyes. Previous provider mentioned blood work for Sjgren's syndrome. Also discussed symptoms management - ANA - Rheumatoid Factor  2. Class 1 obesity due to excess calories with serious comorbidity and body mass index (BMI) of 34.0 to 34.9 in adult --education provided on healthy weight loss through increase in physical activity and proper nutrition. Limited on increase in activity due to gait abnormality/unsteady   3. Recurrent UTI -no recent infection, continues on keflex  4. Hyperlipidemia, mixed -continues on crestor.  - Lipid panel - CMP with eGFR([facility])  5. Primary osteoarthritis involving multiple joints -ongoing, continues on tylenol PRN  6. Right kidney mass Follow up scheduled with urologist for further evaluation  Return in about 4 months (around 12/25/2021) for routine follow up . Carlos American. Harle Battiest  [facility] &  Adult Medicine [phone]

## 2021-08-26 LAB — COMPLETE METABOLIC PANEL WITH GFR
AG Ratio: 1.7 (calc) (ref 1.0–2.5)
ALT: 9 U/L (ref 6–29)
AST: 18 U/L (ref 10–35)
Albumin: 3.8 g/dL (ref 3.6–5.1)
Alkaline phosphatase (APISO): 49 U/L (ref 37–153)
BUN: 12 mg/dL (ref 7–25)
CO2: 27 mmol/L (ref 20–32)
Calcium: 9.2 mg/dL (ref 8.6–10.4)
Chloride: 107 mmol/L (ref 98–110)
Creat: 0.76 mg/dL (ref 0.60–0.95)
Globulin: 2.3 g/dL (calc) (ref 1.9–3.7)
Glucose, Bld: 124 mg/dL (ref 65–139)
Potassium: 4.1 mmol/L (ref 3.5–5.3)
Sodium: 141 mmol/L (ref 135–146)
Total Bilirubin: 0.7 mg/dL (ref 0.2–1.2)
Total Protein: 6.1 g/dL (ref 6.1–8.1)
eGFR: 77 mL/min/{1.73_m2} (ref >=60)

## 2021-08-26 LAB — LIPID PANEL
Cholesterol: 123 mg/dL (ref <200)
HDL: 52 mg/dL (ref >=50)
LDL Cholesterol (Calc): 48 mg/dL (calc)
Non-HDL Cholesterol (Calc): 71 mg/dL (calc) (ref <130)
Total CHOL/HDL Ratio: 2.4 (calc) (ref <5.0)
Triglycerides: 144 mg/dL (ref <150)

## 2021-08-26 LAB — RHEUMATOID FACTOR: Rheumatoid fact SerPl-aCnc: <14 IU/mL (ref <14)

## 2021-08-26 LAB — ANA: Anti Nuclear Antibody (ANA): NEGATIVE

## 2021-10-14 ENCOUNTER — Other Ambulatory Visit: Payer: Self-pay | Admitting: Nurse Practitioner

## 2021-10-14 DIAGNOSIS — N390 Urinary tract infection, site not specified: Secondary | ICD-10-CM

## 2021-10-23 ENCOUNTER — Encounter: Payer: Self-pay | Admitting: Nurse Practitioner

## 2021-10-23 ENCOUNTER — Ambulatory Visit (INDEPENDENT_AMBULATORY_CARE_PROVIDER_SITE_OTHER): Payer: Medicare Other | Admitting: Nurse Practitioner

## 2021-10-23 ENCOUNTER — Telehealth: Payer: Self-pay

## 2021-10-23 DIAGNOSIS — Z0000 Encounter for general adult medical examination without abnormal findings: Secondary | ICD-10-CM | POA: Diagnosis not present

## 2021-10-23 DIAGNOSIS — E2839 Other primary ovarian failure: Secondary | ICD-10-CM

## 2021-10-23 NOTE — Progress Notes (Signed)
   This service is provided via telemedicine  No vital signs collected/recorded due to the encounter was a telemedicine visit.   Location of patient (ex: home, work):  Home  Patient consents to a telephone visit: Yes, see telephone visit dated 10/23/21  Location of the provider (ex: office, home):  [place], [place] Location   Name of any referring provider:  N/A  Names of all persons participating in the telemedicine service and their role in the encounter:  S.Chrae B/CMA, Abbey Chatters, NP, and Patient   Time spent on call:  13 min with medical assistant

## 2021-10-23 NOTE — Progress Notes (Signed)
Subjective:   Susan Boone is a 87 y.o. female who presents for Medicare Annual (Subsequent) preventive examination.  Review of Systems           Objective:    There were no vitals filed for this visit. There is no height or weight on file to calculate BMI.     10/23/2021    2:44 PM 08/25/2021    2:46 PM 12/17/2020    5:23 PM 12/06/2020    2:20 PM 09/27/2020   10:25 AM 08/21/2020    3:06 PM 06/04/2020    3:03 PM  Advanced Directives  Does Patient Have a Medical Advance Directive? Yes Yes Yes Yes No No No  Type of Estate agentAdvance Directive Healthcare Power of [place];Living will Healthcare Power of [place]Attorney;Living will Healthcare Power of Caesars HeadAttorney;Living will Healthcare Power of [place]Attorney;Living will     Does patient want to make changes to medical advance directive? Yes (MAU/Ambulatory/Procedural Areas - Information given) No - Patient declined No - Patient declined      Copy of Healthcare Power of Attorney in Chart? Yes - validated most recent copy scanned in chart (See row information) Yes - validated most recent copy scanned in chart (See row information) No - copy requested No - copy requested     Would patient like information on creating a medical advance directive?     No - Patient declined No - Patient declined No - Patient declined    Current Medications (verified) Outpatient Encounter Medications as of 10/23/2021  Medication Sig   acetaminophen (TYLENOL) 500 MG tablet Take 500 mg by mouth as needed.   cephALEXin (KEFLEX) 500 MG capsule TAKE 1 CAPSULE(500 MG) BY MOUTH DAILY FOR UTI PREVENTION   Liniments (SALONPAS ARTHRITIS PAIN RELIEF EX) Apply 1 patch topically daily as needed (pain).   nadolol (CORGARD) 40 MG tablet Take 1 tablet (40 mg total) by mouth daily.   nystatin-triamcinolone ointment (MYCOLOG) Apply 1 Application topically as needed.   rosuvastatin (CRESTOR) 5 MG tablet Take 1 tablet (5 mg total) by mouth daily.   [DISCONTINUED] docusate sodium (COLACE) 100 MG capsule  Take 1 capsule (100 mg total) by mouth 2 (two) times daily.   [DISCONTINUED] nystatin-triamcinolone ointment (MYCOLOG) Apply 1 application topically 2 (two) times daily.   No facility-administered encounter medications on file as of 10/23/2021.    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 Wilburn MylarElizabeth Albertson   Past Surgical History:  Procedure Laterality Date   ABLATION  2005   endovenous   APPENDECTOMY  AUGMENTATION MAMMAPLASTY Bilateral 1971   [facility] Dr. Tyrell Antonio   CHOLECYSTECTOMY  1986   chronic diarrhea since   COSMETIC SURGERY     LITHOTRIPSY Bilateral 2006   PERCUTANEOUS NEPHROSTOLITHOTOMY Left 06/30/2011   w/ cystoscopy by Dr. Karena Addison at [facility] (and poss ureteral stent placement?)   PERCUTANEOUS NEPHROSTOLITHOTOMY Left 07/07/2011   w/ ureteroscopy and stone manipulation Dr. Karena Addison at [facility]. Surg >4 hrs with gen anesthesia and pt noticed new permanent cognitive deficits after   TOTAL HIP ARTHROPLASTY Right 12/17/2020   Procedure: TOTAL HIP ARTHROPLASTY ANTERIOR APPROACH;  Surgeon: Durene Romans, MD;  Location: [facility];   Service: Orthopedics;  Laterality: Right;   TUBAL LIGATION Bilateral 1968   URETEROLITHOTOMY Right 12/17/2011   Dr. Karena Addison at [facility]   URETEROSCOPY WITH HOLMIUM LASER LITHOTRIPSY Left 07/07/2011   by percutaneous nephrostolithotomy with basket extraction, ureteral stent removal (placed 06/30/11?), and placement of Lt percutaneous nephrostomy tube by Dr. Karena Addison at [facility] for numerous >2cm staghorn calcium oxalate stones in left renal pelvis retained after initial perc nephrostolithotomy wiht stent placement 1 wk prior    Family History  Problem Relation Age of Onset   Stroke Father    Hyperlipidemia Father    Lung cancer Mother    Cancer Maternal Grandmother    Social History   Socioeconomic History   Marital status: Divorced    Spouse name: Not on file   Number of children: Not on file   Years of education: Not on file   Highest education level: Not on file  Occupational History   Occupation: retired  Tobacco Use   Smoking status: Never   Smokeless tobacco: Never   Tobacco comments:    Smoked a little bit, never a habit to break (no struggle)   Vaping Use   Vaping Use: Never used  Substance and Sexual Activity   Alcohol use: No    Alcohol/week: 0.0 standard drinks of alcohol   Drug use: No   Sexual activity: Not on file  Other Topics Concern   Not on file  Social History Narrative   Previously had occasional 1-2 cigarettes but none since 1981   Rarely has caffeine and not coffee   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   She has challenges preparing meals and bathing and dressing due to mobility   Social Determinants of Health   Financial Resource Strain: Not on file  Food Insecurity: Not on file  Transportation Needs: Not on file  Physical Activity: Not on file  Stress: Not on  file  Social Connections: Not on file    Tobacco Counseling Counseling given: Not Answered Tobacco comments: Smoked a little bit, never a habit to break (no struggle)    Clinical Intake:                 Diabetic?no         Activities of Daily Living    12/17/2020    5:23 PM 12/06/2020    2:35 PM  In your present state of health, do you have any difficulty performing the following activities:  Hearing? 0   Vision? 0   Difficulty concentrating or making decisions? 0   Walking or climbing stairs? 1   Dressing or bathing? 1   Doing errands, shopping? 0 0  Patient Care Team: Sharon Seller, NP as PCP - General (Geriatric Medicine) Campbell Stall, MD as Attending Physician (Dermatology) Glenna Fellows, MD as Consulting Physician (Plastic Surgery) Wilburn Mylar, MD as Referring Physician (Urology) Early, Kristen Loader, MD as Consulting Physician (Vascular Surgery) Ollen Gross, MD as Consulting Physician (Orthopedic Surgery) Mateo Flow, MD as Consulting Physician (Ophthalmology)  Indicate any recent Medical Services you may have received from other than [facility] providers in the past year (date may be approximate).     Assessment:   This is a routine wellness examination for [facility].  Hearing/Vision screen Hearing Screening - Comments:: No hearing issues, does not wear hearing aids  Vision Screening - Comments:: Last eye exam less than 12 months ago with Dr.Miller @ [facility]   Dietary issues and exercise activities discussed:     Goals Addressed   None    Depression Screen    10/23/2021    2:43 PM 04/21/2021    4:27 PM 06/04/2020    2:58 PM 04/18/2020    4:01 PM 12/18/2019    3:30 PM 04/08/2017    2:27 PM 03/11/2017    9:14 AM  PHQ 2/9 Scores  PHQ - 2 Score 0 0 0 0 0 0 0    Fall Risk    10/23/2021    2:43 PM 04/21/2021    4:22 PM 06/04/2020    3:01 PM 04/18/2020    4:01 PM 12/18/2019    3:30 PM  Fall Risk   Falls in the past year? 0  0 0 1 0  Number falls in past yr: 0 0 0 0 0  Injury with Fall? 0 0 0 0 0  Risk for fall due to : No Fall Risks No Fall Risks     Follow up Falls evaluation completed Falls evaluation completed       FALL RISK PREVENTION PERTAINING TO THE HOME:  Any stairs in or around the home? Yes  If so, are there any without handrails? No  Home free of loose throw rugs in walkways, pet beds, electrical cords, etc? Yes  Adequate lighting in your home to reduce risk of falls? Yes   ASSISTIVE DEVICES UTILIZED TO PREVENT FALLS:  Life alert? No  Use of a cane, walker or w/c? Yes  Grab bars in the bathroom? Yes  Shower chair or bench in shower? Yes  Elevated toilet seat or a handicapped toilet? Yes   TIMED UP AND GO:  Was the test performed? No .   Cognitive Function:        10/23/2021    2:47 PM 06/04/2020    3:06 PM 12/24/2016    3:57 PM  6CIT Screen  What Year? 0 points 0 points 0 points  What month? 0 points 0 points 0 points  What time? 0 points 0 points 0 points  Count back from 20 0 points 0 points 0 points  Months in reverse 0 points 0 points 0 points  Repeat phrase 0 points 2 points 0 points  Total Score 0 points 2 points 0 points    Immunizations Immunization History  Administered Date(s) Administered   Fluad Quad(high Dose 65+) 12/18/2019   Influenza, High Dose Seasonal PF 12/23/2016, 12/30/2017, 11/28/2020   Influenza-Unspecified 12/21/2013, 12/21/2016, 11/22/2018   PFIZER(Purple Top)SARS-COV-2 Vaccination 04/07/2019, 04/28/2019, 01/01/2020, 07/31/2020   Pfizer Covid-19 Vaccine Bivalent Booster 23yrs & up 11/28/2020   Pneumococcal Conjugate-13 02/06/2015   Pneumococcal Polysaccharide-23 02/11/2002   Tdap 02/23/2013   Zoster, Live 03/23/2014  TDAP status: Up to date  Flu Vaccine status: Up to date  Pneumococcal vaccine status: Up to date  Covid-19 vaccine status: Information provided on how to obtain vaccines.   Qualifies for Shingles Vaccine? Yes   Zostavax  completed No   Shingrix Completed?: No.    Education has been provided regarding the importance of this vaccine. Patient has been advised to call insurance company to determine out of pocket expense if they have not yet received this vaccine. Advised may also receive vaccine at local pharmacy or Health Dept. Verbalized acceptance and understanding.  Screening Tests Health Maintenance  Topic Date Due   Zoster Vaccines- Shingrix (1 of 2) Never done   INFLUENZA VACCINE  10/21/2021   TETANUS/TDAP  02/24/2023   Pneumonia Vaccine 57+ Years old  Completed   DEXA SCAN  Completed   COVID-19 Vaccine  Completed   HPV VACCINES  Aged Out    Health Maintenance  Health Maintenance Due  Topic Date Due   Zoster Vaccines- Shingrix (1 of 2) Never done   INFLUENZA VACCINE  10/21/2021    Colorectal cancer screening: No longer required.   Mammogram status: No longer required due to age.  Declines bone density.   Lung Cancer Screening: (Low Dose CT Chest recommended if Age 47-80 years, 30 pack-year currently smoking OR have quit w/in 15years.) does not qualify.   Lung Cancer Screening Referral: na  Additional Screening:  Hepatitis C Screening: does not qualify; Completed na  Vision Screening: Recommended annual ophthalmology exams for early detection of glaucoma and other disorders of the eye. Is the patient up to date with their annual eye exam?  Yes  Who is the provider or what is the name of the office in which the patient attends annual eye exams? [facility]  If pt is not established with a provider, would they like to be referred to a provider to establish care? No .   Dental Screening: Recommended annual dental exams for proper oral hygiene  Community Resource Referral / Chronic Care Management: CRR required this visit?  No   CCM required this visit?  No      Plan:     I have personally reviewed and noted the following in the patient's chart:   Medical and social history Use of  alcohol, tobacco or illicit drugs  Current medications and supplements including opioid prescriptions.  Functional ability and status Nutritional status Physical activity Advanced directives List of other physicians Hospitalizations, surgeries, and ER visits in previous 12 months Vitals Screenings to include cognitive, depression, and falls Referrals and appointments  In addition, I have reviewed and discussed with patient certain preventive protocols, quality metrics, and best practice recommendations. A written personalized care plan for preventive services as well as general preventive health recommendations were provided to patient.     Sharon Seller, NP   10/23/2021    Virtual Visit via Telephone Note  I connected with patient 10/23/21 at  2:40 PM EDT by telephone and verified that I am speaking with the correct person using two identifiers.  Location: Patient: home Provider: [place]   I discussed the limitations, risks, security and privacy concerns of performing an evaluation and management service by telephone and the availability of in person appointments. I also discussed with the patient that there may be a patient responsible charge related to this service. The patient expressed understanding and agreed to proceed.   I discussed the assessment and treatment plan with the patient. The patient was  provided an opportunity to ask questions and all were answered. The patient agreed with the plan and demonstrated an understanding of the instructions.   The patient was advised to call back or seek an in-person evaluation if the symptoms worsen or if the condition fails to improve as anticipated.  I provided 22 minutes of non-face-to-face time during this encounter.  Janene Harvey. [employer] printed and mailed

## 2021-10-23 NOTE — Telephone Encounter (Signed)
[name], maclachlan are scheduled for a virtual visit with your provider today.    Just as we do with appointments in the office, we must obtain your consent to participate.  Your consent will be active for this visit and any virtual visit you may have with one of our providers in the next 365 days.    If you have a MyChart account, I can also send a copy of this consent to you electronically.  All virtual visits are billed to your insurance company just like a traditional visit in the office.  As this is a virtual visit, video technology does not allow for your provider to perform a traditional examination.  This may limit your provider's ability to fully assess your condition.  If your provider identifies any concerns that need to be evaluated in person or the need to arrange testing such as labs, EKG, etc, we will make arrangements to do so.    Although advances in technology are sophisticated, we cannot ensure that it will always work on either your end or our end.  If the connection with a video visit is poor, we may have to switch to a telephone visit.  With either a video or telephone visit, we are not always able to ensure that we have a secure connection.   I need to obtain your verbal consent now.   Are you willing to proceed with your visit today?   Susan Boone has provided verbal consent on 10/23/2021 for a virtual visit (video or telephone).   [name], [place] 10/23/2021  2:37 PM

## 2021-10-23 NOTE — Patient Instructions (Signed)
Susan Boone , Thank you for taking time to come for your Medicare Wellness Visit. I appreciate your ongoing commitment to your health goals. Please review the following plan we discussed and let me know if I can assist you in the future.   Screening recommendations/referrals: Colonoscopy aged out Mammogram aged out Bone Density ordered today Recommended yearly ophthalmology/optometry visit for glaucoma screening and checkup Recommended yearly dental visit for hygiene and checkup  Vaccinations: Influenza vaccine- due annually in September/October Pneumococcal vaccine up to date Tdap vaccine up to date Shingles vaccine-DUE- recommend to get at your local pharmacy     Advanced directives: on file.   Conditions/risks identified: advanced age.   Next appointment: yearly    Preventive Care 36 Years and Older, Female Preventive care refers to lifestyle choices and visits with your health care provider that can promote health and wellness. What does preventive care include? A yearly physical exam. This is also called an annual well check. Dental exams once or twice a year. Routine eye exams. Ask your health care provider how often you should have your eyes checked. Personal lifestyle choices, including: Daily care of your teeth and gums. Regular physical activity. Eating a healthy diet. Avoiding tobacco and drug use. Limiting alcohol use. Practicing safe sex. Taking low-dose aspirin every day. Taking vitamin and mineral supplements as recommended by your health care provider. What happens during an annual well check? The services and screenings done by your health care provider during your annual well check will depend on your age, overall health, lifestyle risk factors, and family history of disease. Counseling  Your health care provider may ask you questions about your: Alcohol use. Tobacco use. Drug use. Emotional well-being. Home and relationship well-being. Sexual  activity. Eating habits. History of falls. Memory and ability to understand (cognition). Work and work Astronomer. Reproductive health. Screening  You may have the following tests or measurements: Height, weight, and BMI. Blood pressure. Lipid and cholesterol levels. These may be checked every 5 years, or more frequently if you are over 11 years old. Skin check. Lung cancer screening. You may have this screening every year starting at age 79 if you have a 30-pack-year history of smoking and currently smoke or have quit within the past 15 years. Fecal occult blood test (FOBT) of the stool. You may have this test every year starting at age 4. Flexible sigmoidoscopy or colonoscopy. You may have a sigmoidoscopy every 5 years or a colonoscopy every 10 years starting at age 74. Hepatitis C blood test. Hepatitis B blood test. Sexually transmitted disease (STD) testing. Diabetes screening. This is done by checking your blood sugar (glucose) after you have not eaten for a while (fasting). You may have this done every 1-3 years. Bone density scan. This is done to screen for osteoporosis. You may have this done starting at age 56. Mammogram. This may be done every 1-2 years. Talk to your health care provider about how often you should have regular mammograms. Talk with your health care provider about your test results, treatment options, and if necessary, the need for more tests. Vaccines  Your health care provider may recommend certain vaccines, such as: Influenza vaccine. This is recommended every year. Tetanus, diphtheria, and acellular pertussis (Tdap, Td) vaccine. You may need a Td booster every 10 years. Zoster vaccine. You may need this after age 29. Pneumococcal 13-valent conjugate (PCV13) vaccine. One dose is recommended after age 53. Pneumococcal polysaccharide (PPSV23) vaccine. One dose is recommended after age 63. Talk  to your health care provider about which screenings and vaccines  you need and how often you need them. This information is not intended to replace advice given to you by your health care provider. Make sure you discuss any questions you have with your health care provider. Document Released: 04/05/2015 Document Revised: 11/27/2015 Document Reviewed: 01/08/2015 Elsevier Interactive Patient Education  2017  Prevention in the Home Falls can cause injuries. They can happen to people of all ages. There are many things you can do to make your home safe and to help prevent falls. What can I do on the outside of my home? Regularly fix the edges of walkways and driveways and fix any cracks. Remove anything that might make you trip as you walk through a door, such as a raised step or threshold. Trim any bushes or trees on the path to your home. Use bright outdoor lighting. Clear any walking paths of anything that might make someone trip, such as rocks or tools. Regularly check to see if handrails are loose or broken. Make sure that both sides of any steps have handrails. Any raised decks and porches should have guardrails on the edges. Have any leaves, snow, or ice cleared regularly. Use sand or salt on walking paths during winter. Clean up any spills in your garage right away. This includes oil or grease spills. What can I do in the bathroom? Use night lights. Install grab bars by the toilet and in the tub and shower. Do not use towel bars as grab bars. Use non-skid mats or decals in the tub or shower. If you need to sit down in the shower, use a plastic, non-slip stool. Keep the floor dry. Clean up any water that spills on the floor as soon as it happens. Remove soap buildup in the tub or shower regularly. Attach bath mats securely with double-sided non-slip rug tape. Do not have throw rugs and other things on the floor that can make you trip. What can I do in the bedroom? Use night lights. Make sure that you have a light by your bed that  is easy to reach. Do not use any sheets or blankets that are too big for your bed. They should not hang down onto the floor. Have a firm chair that has side arms. You can use this for support while you get dressed. Do not have throw rugs and other things on the floor that can make you trip. What can I do in the kitchen? Clean up any spills right away. Avoid walking on wet floors. Keep items that you use a lot in easy-to-reach places. If you need to reach something above you, use a strong step stool that has a grab bar. Keep electrical cords out of the way. Do not use floor polish or wax that makes floors slippery. If you must use wax, use non-skid floor wax. Do not have throw rugs and other things on the floor that can make you trip. What can I do with my stairs? Do not leave any items on the stairs. Make sure that there are handrails on both sides of the stairs and use them. Fix handrails that are broken or loose. Make sure that handrails are as long as the stairways. Check any carpeting to make sure that it is firmly attached to the stairs. Fix any carpet that is loose or worn. Avoid having throw rugs at the top or bottom of the stairs. If you do have throw rugs,  attach them to the floor with carpet tape. Make sure that you have a light switch at the top of the stairs and the bottom of the stairs. If you do not have them, ask someone to add them for you. What else can I do to help prevent falls? Wear shoes that: Do not have high heels. Have rubber bottoms. Are comfortable and fit you well. Are closed at the toe. Do not wear sandals. If you use a stepladder: Make sure that it is fully opened. Do not climb a closed stepladder. Make sure that both sides of the stepladder are locked into place. Ask someone to hold it for you, if possible. Clearly mark and make sure that you can see: Any grab bars or handrails. First and last steps. Where the edge of each step is. Use tools that help you  move around (mobility aids) if they are needed. These include: Canes. Walkers. Scooters. Crutches. Turn on the lights when you go into a dark area. Replace any light bulbs as soon as they burn out. Set up your furniture so you have a clear path. Avoid moving your furniture around. If any of your floors are uneven, fix them. If there are any pets around you, be aware of where they are. Review your medicines with your doctor. Some medicines can make you feel dizzy. This can increase your chance of falling. Ask your doctor what other things that you can do to help prevent falls. This information is not intended to replace advice given to you by your health care provider. Make sure you discuss any questions you have with your health care provider. Document Released: 01/03/2009 Document Revised: 08/15/2015 Document Reviewed: 04/13/2014 Elsevier Interactive Patient Education  2017 [employer].

## 2021-12-16 LAB — CBC AND DIFFERENTIAL
HCT: 40 (ref 36–46)
Hemoglobin: 13.2 (ref 12.0–16.0)
Platelets: 153 10*3/uL (ref 150–400)
WBC: 5.8

## 2021-12-16 LAB — BASIC METABOLIC PANEL
BUN: 14 (ref 4–21)
CO2: 28 — AB (ref 13–22)
Chloride: 28 — AB (ref 99–108)
Creatinine: 0.7 (ref 0.5–1.1)
Glucose: 92
Potassium: 4.1 mEq/L (ref 3.5–5.1)
Sodium: 140 (ref 137–147)

## 2021-12-16 LAB — COMPREHENSIVE METABOLIC PANEL
Albumin: 3.8 (ref 3.5–5.0)
Calcium: 8.7 (ref 8.7–10.7)
eGFR: 86

## 2021-12-16 LAB — HEPATIC FUNCTION PANEL
ALT: 9 U/L (ref 7–35)
AST: 18 (ref 13–35)
Alkaline Phosphatase: 49 (ref 25–125)
Bilirubin, Total: 0.8

## 2021-12-16 LAB — CBC: RBC: 4.66 (ref 3.87–5.11)

## 2021-12-29 ENCOUNTER — Ambulatory Visit (INDEPENDENT_AMBULATORY_CARE_PROVIDER_SITE_OTHER): Payer: Medicare Other | Admitting: Nurse Practitioner

## 2021-12-29 ENCOUNTER — Encounter: Payer: Self-pay | Admitting: Nurse Practitioner

## 2021-12-29 VITALS — BP 122/58 | HR 60 | Temp 97.1°F | Ht 62.0 in | Wt 190.2 lb

## 2021-12-29 DIAGNOSIS — I872 Venous insufficiency (chronic) (peripheral): Secondary | ICD-10-CM

## 2021-12-29 DIAGNOSIS — N2889 Other specified disorders of kidney and ureter: Secondary | ICD-10-CM | POA: Diagnosis not present

## 2021-12-29 DIAGNOSIS — I10 Essential (primary) hypertension: Secondary | ICD-10-CM

## 2021-12-29 DIAGNOSIS — K580 Irritable bowel syndrome with diarrhea: Secondary | ICD-10-CM

## 2021-12-29 DIAGNOSIS — N390 Urinary tract infection, site not specified: Secondary | ICD-10-CM

## 2021-12-29 DIAGNOSIS — Z23 Encounter for immunization: Secondary | ICD-10-CM | POA: Diagnosis not present

## 2021-12-29 DIAGNOSIS — M159 Polyosteoarthritis, unspecified: Secondary | ICD-10-CM

## 2021-12-29 DIAGNOSIS — E782 Mixed hyperlipidemia: Secondary | ICD-10-CM | POA: Diagnosis not present

## 2021-12-29 NOTE — Progress Notes (Unsigned)
Careteam: Patient Care Team: Sharon Seller, NP as PCP - General (Geriatric Medicine) Campbell Stall, MD as Attending Physician (Dermatology) Glenna Fellows, MD as Consulting Physician (Plastic Surgery) Wilburn Mylar, MD as Referring Physician (Urology) Early, Kristen Loader, MD as Consulting Physician (Vascular Surgery) Ollen Gross, MD as Consulting Physician (Orthopedic Surgery) Mateo Flow, MD as Consulting Physician (Ophthalmology)  PLACE OF SERVICE:  [facility]  Advanced Directive information    Allergies  Allergen Reactions   Azithromycin Other (See Comments)    thrush   Latex Hives and Other (See Comments)    Lip swelling and redness    Codeine Itching   Doxycycline     Unsure of reaction    Nsaids     Stomach ulcers    Tape Rash    Latex Bandaid     Chief Complaint  Patient presents with   Medical Management of Chronic Issues    Patient presents today for a 4 month follow-up   Quality Metric Gaps    Zoster, COVID#6     HPI: Patient is a 87 y.o. female for routine follow up.   Follows with urology for kidney stones.   Venous insufficency: chronic. Continues to elevate legs and restrict salt. Wants a refereral back to vein specialist to see if they have alternatives to compression hose, as she can not get them on.   She had a urine sample that cam beack with bacteria about 1.5 weeks ago. They wanted her to take an antibiotic and she never did because she wasn't having symptoms. She questions if she needs a follow up sample. Continues to be asymptomatic  Thinks she may need cataract surgery on her right eye. Has an eye doctor and states she will make an appointment with her.   IBS is controlled. Has diarrhea often which is her baseline. She is diligent about hydrating.   States she is constantly leaking urine and wears depends.  We discussed using a barrier ointment such as desitin to help with moisture and prevent skin breakdown.    Review  of Systems:  Review of Systems  Constitutional:  Negative for fever and malaise/fatigue.  HENT:  Negative for ear discharge and hearing loss.   Eyes:  Negative for blurred vision.  Respiratory:  Negative for cough and shortness of breath.   Cardiovascular:  Positive for leg swelling. Negative for chest pain and palpitations.  Gastrointestinal:  Positive for diarrhea. Negative for abdominal pain, blood in stool, constipation and heartburn.  Genitourinary:  Negative for dysuria and hematuria.       Incontinent  Musculoskeletal:  Positive for joint pain.  Skin:  Negative for itching and rash.  Neurological:  Negative for dizziness and headaches.  Psychiatric/Behavioral:  Negative for depression and memory loss. The patient is not nervous/anxious.     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 Wilburn Mylar   Past Surgical History:  Procedure Laterality Date   ABLATION  2005   endovenous   APPENDECTOMY     AUGMENTATION MAMMAPLASTY Bilateral 1971   [facility] Dr. Tyrell Antonio   CHOLECYSTECTOMY  1986   chronic diarrhea since   COSMETIC SURGERY     LITHOTRIPSY Bilateral 2006   PERCUTANEOUS NEPHROSTOLITHOTOMY Left 06/30/2011   w/ cystoscopy by  Dr. Karena Addison at [facility] (and poss ureteral stent placement?)   PERCUTANEOUS NEPHROSTOLITHOTOMY Left 07/07/2011   w/ ureteroscopy and stone manipulation Dr. Karena Addison at [facility]. Surg >4 hrs with gen anesthesia and pt noticed new permanent cognitive deficits after   TOTAL HIP ARTHROPLASTY Right 12/17/2020   Procedure: TOTAL HIP ARTHROPLASTY ANTERIOR APPROACH;  Surgeon: Durene Romans, MD;  Location: [facility];  Service: Orthopedics;  Laterality: Right;   TUBAL LIGATION Bilateral 1968   URETEROLITHOTOMY Right 12/17/2011   Dr. Karena Addison at [facility]   URETEROSCOPY WITH HOLMIUM LASER LITHOTRIPSY Left 07/07/2011   by percutaneous nephrostolithotomy with basket extraction, ureteral stent removal (placed 06/30/11?), and placement of Lt percutaneous nephrostomy tube by Dr. Karena Addison at [facility] for numerous >2cm staghorn calcium oxalate stones in left renal pelvis retained after initial perc nephrostolithotomy wiht stent placement 1 wk prior    Social History:   reports that she has never smoked. She has never used smokeless tobacco. She reports that she does not drink alcohol and does not use drugs.  Family History  Problem Relation Age of Onset   Stroke Father    Hyperlipidemia Father    Lung cancer Mother    Cancer Maternal Grandmother     Medications: Patient's Medications  New Prescriptions   No medications on file  Previous Medications   ACETAMINOPHEN (TYLENOL) 500 MG TABLET    Take 500 mg by mouth as needed.   CEPHALEXIN (KEFLEX) 500 MG CAPSULE    TAKE 1 CAPSULE(500 MG) BY MOUTH DAILY FOR UTI PREVENTION   LINIMENTS (SALONPAS ARTHRITIS PAIN RELIEF EX)    Apply 1 patch topically daily as needed (pain).   NADOLOL (CORGARD) 40 MG TABLET    Take 1 tablet (40 mg total) by mouth daily.   NYSTATIN-TRIAMCINOLONE OINTMENT (MYCOLOG)    Apply 1 Application topically as needed.   ROSUVASTATIN (CRESTOR) 5 MG TABLET    Take 1 tablet (5 mg total) by mouth daily.  Modified Medications   No  medications on file  Discontinued Medications   No medications on file    Physical Exam:  Vitals:   12/29/21 1433  BP: (!) 122/58  Pulse: 60  Temp: (!) 97.1 F (36.2 C)  SpO2: 98%  Weight: 86.3 kg  Height: 5\' 2"  (1.575 m)   Body mass index is 34.79 kg/m. Wt Readings from Last 3 Encounters:  12/29/21 86.3 kg  08/25/21 86.2 kg  04/21/21 85.7 kg    Physical Exam Constitutional:      General: She is not in acute distress. HENT:     Mouth/Throat:     Mouth: Mucous membranes are moist.  Eyes:     Pupils: Pupils are equal, round, and reactive to light.  Cardiovascular:     Rate and Rhythm: Normal rate and regular rhythm.     Pulses: Normal pulses.     Heart  sounds: Normal heart sounds. No murmur heard. Pulmonary:     Effort: Pulmonary effort is normal. No respiratory distress.     Breath sounds: Normal breath sounds. No wheezing.  Abdominal:     General: Bowel sounds are normal. There is no distension.     Palpations: Abdomen is soft.     Tenderness: There is no abdominal tenderness.  Musculoskeletal:     Right lower leg: 4+ Edema present.     Left lower leg: 3+ Edema present.  Skin:    Capillary Refill: Capillary refill takes less than 2 seconds.     Comments: Blue/purple discoloration of BLE d/t chronic venous insufficiency  Neurological:     Mental Status: She is alert and oriented to person, place, and time. Mental status is at baseline.     Gait: Gait abnormal.     Comments: Ambulates with walker  Psychiatric:        Mood and Affect: Mood normal.        Behavior: Behavior normal.     Labs reviewed: Basic Metabolic Panel: Recent Labs    04/21/21 1637 08/25/21 1541  NA 143 141  K 4.2 4.1  CL 107 107  CO2 33* 27  GLUCOSE 113* 124  BUN 14 12  CREATININE 0.70 0.76  CALCIUM 9.2 9.2   Liver Function Tests: Recent Labs    04/21/21 1637 08/25/21 1541  AST 16 18  ALT 9 9  BILITOT 0.8 0.7  PROT 6.3 6.1   No results for input(s): "LIPASE",  "AMYLASE" in the last 8760 hours. No results for input(s): "AMMONIA" in the last 8760 hours. CBC: Recent Labs    04/21/21 1637  WBC 7.2  NEUTROABS 4,111  HGB 13.7  HCT 43.0  MCV 86.3  PLT 190   Lipid Panel: Recent Labs    08/25/21 1541  CHOL 123  HDL 52  LDLCALC 48  TRIG 144  CHOLHDL 2.4   TSH: No results for input(s): "TSH" in the last 8760 hours. A1C: Lab Results  Component Value Date   HGBA1C 5.5 08/20/2020     Assessment/Plan 1. Recurrent UTI - takes keflex preventatively - currently asymptomatic  , will continue to monitor. She is followed by urologist, we will not recheck urine today due to no symptoms.   2. Hyperlipidemia, mixed - continue crestor, LDL at goal on last labs.   3. Need for influenza vaccination - received in office today - Flu Vaccine QUAD High Dose(Fluad)  4. Primary osteoarthritis involving multiple joints - controlled, rarely takes tylenol   5. Right kidney mass - follows with urology  6. Venous insufficiency of both lower extremities - chronic - will refer back to vein specialist at her request.   7. Primary hypertension - controlled - continue nadolol  8. Irritable bowel syndrome with diarrhea - controlled   Return in about 4 months (around 05/01/2022) for routine follow up .  Student- Waunita Schooner, RN I personally was present during the history, physical exam and medical decision-making activities of this service and have verified that the service and findings are accurately documented in the student's note Sanjuan Sawa K. Cor[place], [facility] [phone]

## 2022-01-13 ENCOUNTER — Other Ambulatory Visit: Payer: Self-pay | Admitting: *Deleted

## 2022-01-13 DIAGNOSIS — I872 Venous insufficiency (chronic) (peripheral): Secondary | ICD-10-CM

## 2022-01-25 ENCOUNTER — Other Ambulatory Visit: Payer: Self-pay | Admitting: Nurse Practitioner

## 2022-01-25 DIAGNOSIS — E782 Mixed hyperlipidemia: Secondary | ICD-10-CM

## 2022-01-25 DIAGNOSIS — I10 Essential (primary) hypertension: Secondary | ICD-10-CM

## 2022-01-28 NOTE — Progress Notes (Signed)
Requested by:  Sharon Seller, NP [address] [place],  [place] [address]   History of Present Illness   Susan Boone is a 87 y.o. (1934-09-25) female who presents for re evaluation of BLE swelling R> L. History of left leg venous ablation almost 20 years ago by Dr. Arbie Cookey. She has had improvement in LLE since then. She explains that she has had worsening swelling in RLE over the years. Her mobility has been limited by her OA. As a result she has not been able to elevate and wear her compression like she use to.  She says she really struggles now more so then before to get the compression on. She denies any pain really associated with her swelling. Her legs stay tender to the touch like a bruise. More of her discomfort is in her knees and hips. She has no history of DVT.   Venous symptoms include: heavy, swelling Onset/duration:  > 20 years  Occupation:  retired Aggravating factors: sitting, standing Alleviating factors: elevation Compression:  yes, in the past Helps:  yes Pain medications:  none Previous vein procedures:  venous ablation LLE History of DVT:  no  Past Medical History:  Diagnosis Date   Allergy    environmental   Anemia    Arthritis    Asthma    Chronic diarrhea 1986   since cholecystecomy   Chronic venous insufficiency of lower extremity    Complication of anesthesia    Hard to wake up after kidney stone surgery   Esophageal reflux    H/O orthostatic hypotension    History of kidney stones    Kidney stones 2000    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 Wilburn Mylar    Past Surgical History:  Procedure Laterality Date   ABLATION  2005   endovenous   APPENDECTOMY     AUGMENTATION MAMMAPLASTY Bilateral 1971   [facility] Dr. Tyrell Antonio   CHOLECYSTECTOMY  1986   chronic diarrhea since   COSMETIC SURGERY     LITHOTRIPSY Bilateral 2006   PERCUTANEOUS NEPHROSTOLITHOTOMY Left 06/30/2011   w/ cystoscopy by Dr. Karena Addison at [facility] (and poss ureteral stent placement?)   PERCUTANEOUS NEPHROSTOLITHOTOMY Left 07/07/2011   w/ ureteroscopy and stone manipulation Dr. Karena Addison at [facility]. Surg >4 hrs with gen anesthesia and pt noticed new permanent cognitive deficits after   TOTAL HIP ARTHROPLASTY Right 12/17/2020   Procedure: TOTAL HIP ARTHROPLASTY ANTERIOR APPROACH;  Surgeon: Durene Romans, MD;  Location: [facility];  Service: Orthopedics;  Laterality: Right;   TUBAL LIGATION Bilateral 1968   URETEROLITHOTOMY Right 12/17/2011   Dr. Karena Addison at [facility]   URETEROSCOPY WITH HOLMIUM LASER LITHOTRIPSY Left 07/07/2011   by percutaneous nephrostolithotomy with basket extraction, ureteral stent removal (placed 06/30/11?), and placement of Lt percutaneous nephrostomy tube by Dr. Karena Addison at [facility] for numerous >2cm staghorn calcium oxalate stones in left renal pelvis retained after initial perc nephrostolithotomy wiht stent placement 1 wk prior     Social History   Socioeconomic History   Marital status: Divorced    Spouse name: Not on file  Number of children: Not on file   Years of education: Not on file   Highest education level: Not on file  Occupational History   Occupation: retired  Tobacco Use   Smoking status: Never   Smokeless tobacco: Never   Tobacco comments:    Smoked a little bit, never a habit to break (no struggle)   Vaping Use   Vaping Use: Never used  Substance and Sexual Activity   Alcohol use: No    Alcohol/week: 0.0 standard drinks of alcohol   Drug use: No   Sexual activity: Not on file  Other Topics Concern   Not on  file  Social History Narrative   Previously had occasional 1-2 cigarettes but none since 1981   Rarely has caffeine and not coffee   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   She has challenges preparing meals and bathing and dressing due to mobility   Social Determinants of Health   Financial Resource Strain: Not on file  Food Insecurity: Not on file  Transportation Needs: Not on file  Physical Activity: Not on file  Stress: Not on file  Social Connections: Not on file  Intimate Partner Violence: Not on file    Family History  Problem Relation Age of Onset   Stroke Father    Hyperlipidemia Father    Lung cancer Mother    Cancer Maternal Grandmother     Current Outpatient Medications  Medication Sig Dispense Refill   acetaminophen (TYLENOL) 500 MG tablet Take 500 mg by mouth as needed.     cephALEXin (KEFLEX) 500 MG capsule TAKE 1 CAPSULE(500 MG) BY MOUTH DAILY FOR UTI PREVENTION 90 capsule 1   Liniments (SALONPAS ARTHRITIS PAIN RELIEF EX) Apply 1 patch topically daily as needed (pain).     nadolol (CORGARD) 40 MG tablet TAKE 1 TABLET(40 MG) BY MOUTH DAILY 90 tablet 1   nystatin-triamcinolone ointment (MYCOLOG) Apply 1 Application topically as needed.     rosuvastatin (CRESTOR) 5 MG tablet TAKE 1 TABLET(5 MG) BY MOUTH DAILY 90 tablet 1   No current facility-administered medications for this visit.    Allergies  Allergen Reactions   Azithromycin Other (See Comments)    thrush   Latex Hives and Other (See Comments)    Lip swelling and redness    Codeine Itching   Doxycycline     Unsure of reaction    Nsaids     Stomach ulcers    Tape Rash    Latex Bandaid     REVIEW OF SYSTEMS (negative unless checked):   Cardiac:  []  Chest pain or chest pressure? []  Shortness of breath upon activity? []   Shortness of breath when lying flat? []  Irregular heart rhythm?  Vascular:  []  Pain in calf, thigh, or hip brought on by walking? []  Pain in feet at night that wakes you up from your sleep? []  Blood clot in your veins? []  Leg swelling?  Pulmonary:  []  Oxygen at home? []  Productive cough? []  Wheezing?  Neurologic:  []  Sudden weakness in arms or legs? []  Sudden numbness in arms or legs? []  Sudden onset of difficult speaking or slurred speech? []  Temporary loss of vision in one eye? []  Problems with dizziness?  Gastrointestinal:  []  Blood in stool? []  Vomited blood?  Genitourinary:  []   Burning when urinating? []  Blood in urine?  Psychiatric:  []  Major depression  Hematologic:  []  Bleeding problems? []  Problems with blood clotting?  Dermatologic:  []  Rashes or ulcers?  Constitutional:  []  Fever or chills?  Ear/Nose/Throat:  []  Change in hearing? []  Nose bleeds? []  Sore throat?  Musculoskeletal:  []  Back pain? []  Joint pain? []  Muscle pain?   Physical Examination     Vitals:   02/02/22 1243  BP: (!) 156/75  Pulse: (!) 51  Resp: 16  Temp: 98 F (36.7 C)  TempSrc: Temporal  SpO2: 99%  Weight: 190 lb (86.2 kg)  Height: 5' 2.5" (1.588 m)   Body mass index is 34.2 kg/m.  General:  WDWN in NAD; vital signs documented above Gait: Normal, with rolling walker HENT: WNL, normocephalic Pulmonary: normal non-labored breathing Cardiac: regular HR Vascular Exam/Pulses:2+ Dp pulses bilaterally, feet warm and well perfused Extremities: without varicose veins, with reticular veins, with edema right > left leg, with  stasis pigmentation right greater than left, with lipodermatosclerosis, papillomatosis, without ulcers. Bluish discoloration of most of her toes bilaterally  Musculoskeletal: no muscle wasting or atrophy  Neurologic: A&O X 3;  No focal weakness or paresthesias are detected Psychiatric:  The pt has Normal affect.  Non-invasive Vascular Imaging    Patient unable to tolerate duplex today Prior Duplex 10/31/2020: Venous Reflux Times  +--------------+---------+------+-----------+------------+--------+  RIGHT        Reflux NoRefluxReflux TimeDiameter cmsComments                          Yes                                   +--------------+---------+------+-----------+------------+--------+  CFV                    yes   >1 second                       +--------------+---------+------+-----------+------------+--------+  FV prox       no                                              +--------------+---------+------+-----------+------------+--------+  FV mid        no                                              +--------------+---------+------+-----------+------------+--------+  FV dist       no                                              +--------------+---------+------+-----------+------------+--------+  Popliteal    no                                              +--------------+---------+------+-----------+------------+--------+  GSV at SFJ              yes    >  500 ms     0.627              +--------------+---------+------+-----------+------------+--------+  GSV prox thigh          yes    >500 ms     0.590              +--------------+---------+------+-----------+------------+--------+  GSV mid thigh           yes    >500 ms     0.748              +--------------+---------+------+-----------+------------+--------+  GSV dist thigh          yes    >500 ms     0.640              +--------------+---------+------+-----------+------------+--------+  GSV at knee             yes    >500 ms     0.776              +--------------+---------+------+-----------+------------+--------+  GSV prox calf                              0.958              +--------------+---------+------+-----------+------------+--------+  GSV mid calf                                0.472              +--------------+---------+------+-----------+------------+--------+  SSV Pop Fossa no                           0.253              +--------------+---------+------+-----------+------------+--------+  SSV prox calf           yes    >500 ms     0.314              +--------------+---------+------+-----------+------------+--------+  SSV mid calf                               0.212              +--------------+---------+------+-----------+------------+--------+   Medical Decision Making   JEANETTE RAUTH is a 87 y.o. female who presents with: RLE chronic venous insufficiency with swelling. I think she also has lymphedema with hyperpigmentation, elephantiasis, and some papillomatosis. She was unable to tolerate duplex today however she has known superficial and deep venous reflux of BLE, R > L. I think her best options would be continued compression, compression boots, and better more frequent elevation. She has tried compression for many years, elevation and exercise no with minimal tolerance of these and therefore worsening symptoms. We discussed trying a donning tool to help get her compression on. Also discussed proper elevation and she will work on this. I am also prescribing a lymphedema pump which will hopefully giver her the greatest benefit and I think if she can get some improvement of her edema the compression will also be easier to don and doff.  She can follow up as needed if she has new or worsening symptoms.    Susan Congress, PA-C [facility] Office: [phone]  02/02/2022, 1:35 PM  Clinic MD: Myra Gianotti

## 2022-02-02 ENCOUNTER — Encounter (HOSPITAL_COMMUNITY): Payer: Self-pay

## 2022-02-02 ENCOUNTER — Ambulatory Visit (HOSPITAL_COMMUNITY)
Admission: RE | Admit: 2022-02-02 | Discharge: 2022-02-02 | Disposition: A | Discharge status: Home / Self Care | Payer: Medicare Other | Source: Ambulatory Visit | Attending: Surgery | Admitting: Surgery

## 2022-02-02 ENCOUNTER — Encounter: Payer: Self-pay | Admitting: Physician Assistant

## 2022-02-02 ENCOUNTER — Ambulatory Visit (INDEPENDENT_AMBULATORY_CARE_PROVIDER_SITE_OTHER): Payer: Medicare Other | Admitting: Physician Assistant

## 2022-02-02 VITALS — BP 156/75 | HR 51 | Temp 98.0°F | Resp 16 | Ht 62.5 in | Wt 190.0 lb

## 2022-02-02 DIAGNOSIS — I872 Venous insufficiency (chronic) (peripheral): Secondary | ICD-10-CM

## 2022-02-02 DIAGNOSIS — I890 Lymphedema, not elsewhere classified: Secondary | ICD-10-CM | POA: Diagnosis not present

## 2022-03-09 IMAGING — DX DG CHEST 2V
2 series · 2 of 2 positions shown · non-contrast
Comparison: Chest x-ray report 07/30/2000.

CLINICAL DATA: Cough.

EXAM:
CHEST - 2 VIEW

[view not recorded]
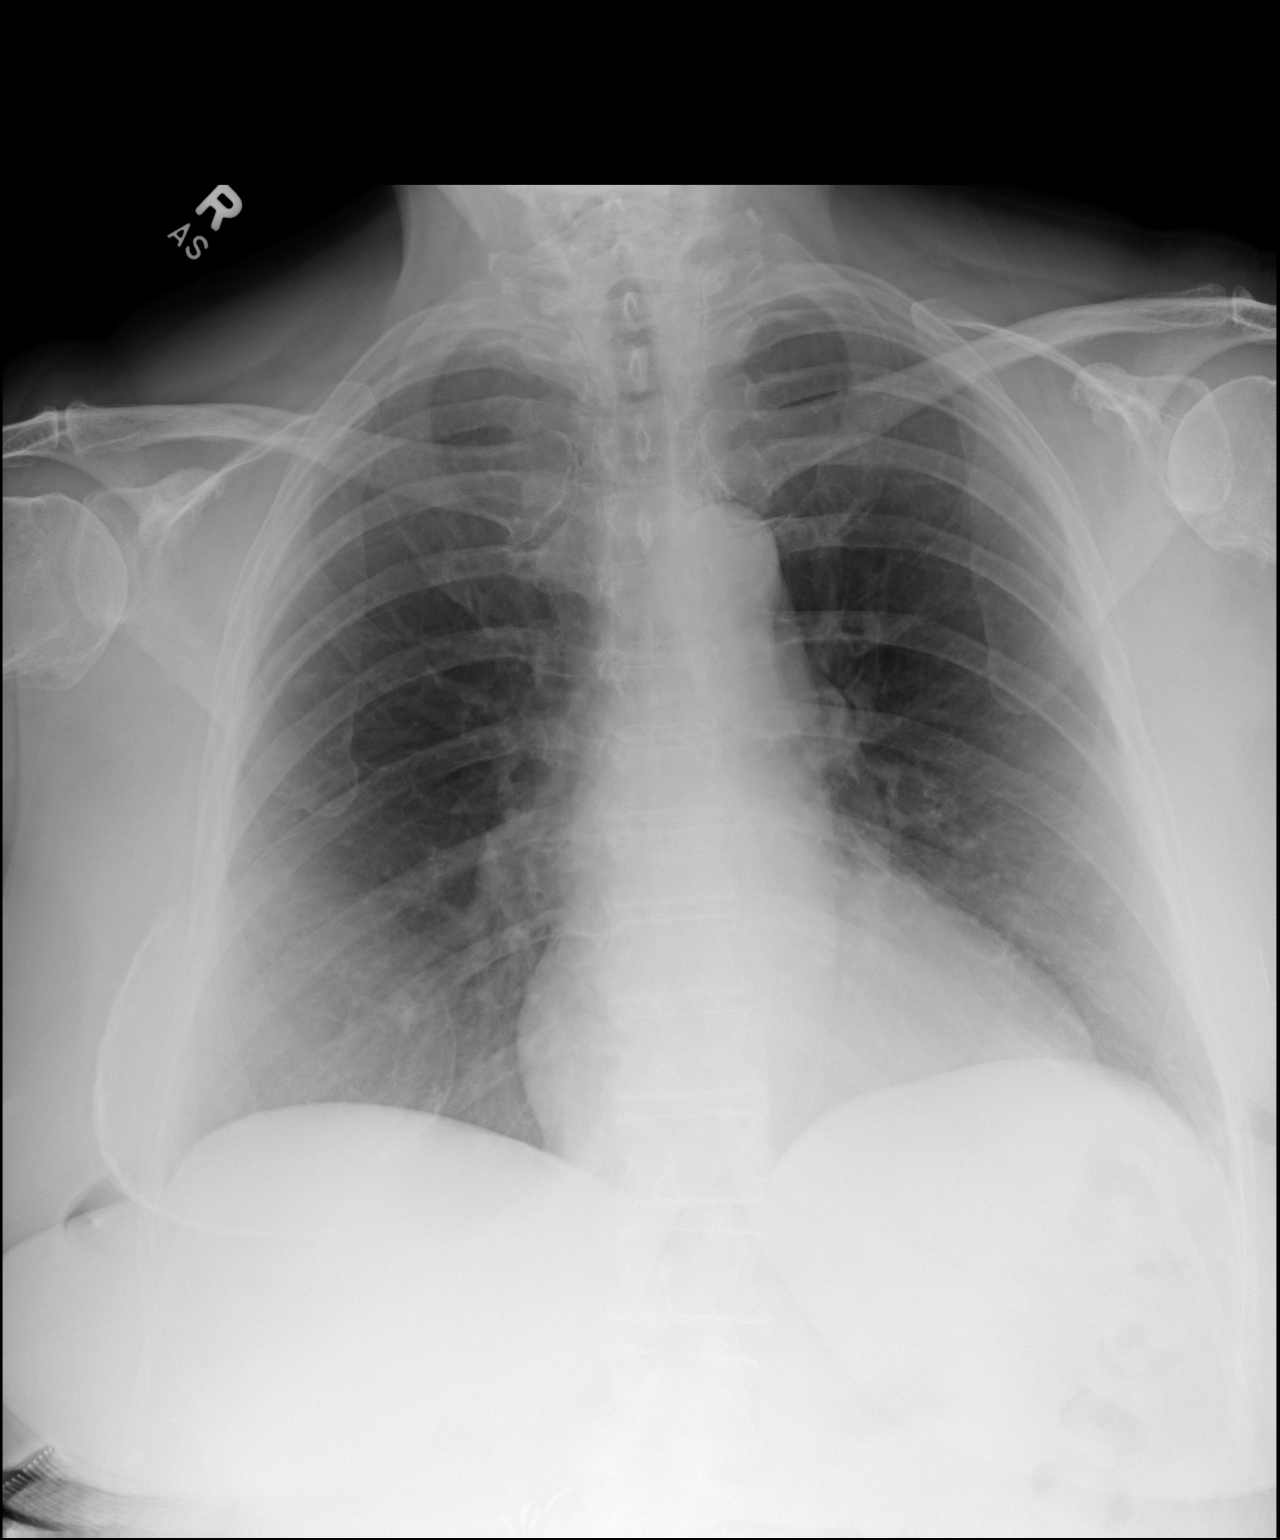

[dg chest 2 view]
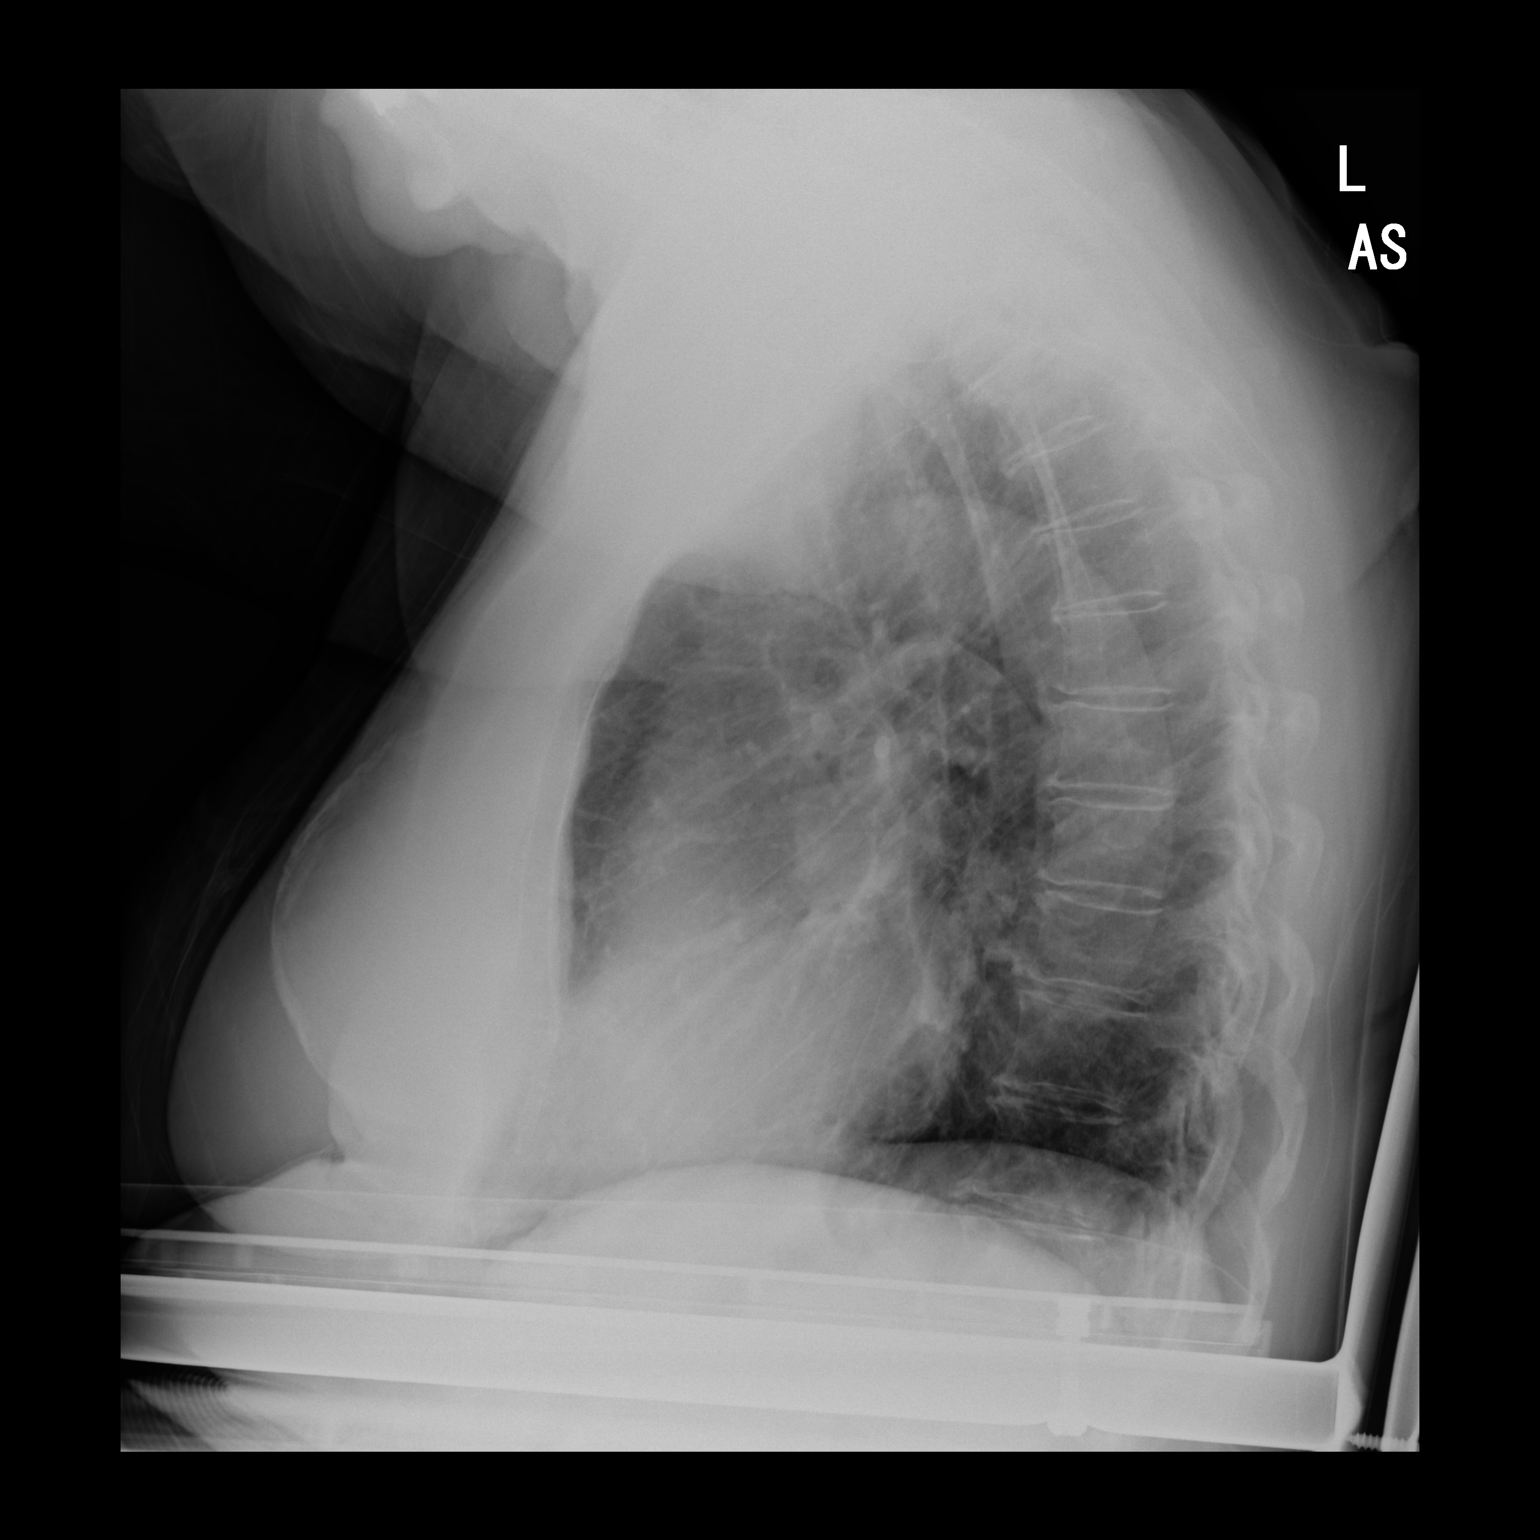

[2 of 2 positions shown; findings below may reference images not displayed]

FINDINGS: Mediastinum and hilar structures normal. Heart size upper limits
normal. No pulmonary venous congestion. Lungs are clear. No pleural
effusion or pneumothorax. Calcified right breast implant noted. No
acute bony abnormality. Degenerative change thoracic spine.
IMPRESSION: No acute cardiopulmonary disease.

## 2022-04-18 ENCOUNTER — Other Ambulatory Visit: Payer: Self-pay | Admitting: Nurse Practitioner

## 2022-04-18 DIAGNOSIS — N390 Urinary tract infection, site not specified: Secondary | ICD-10-CM

## 2022-04-24 ENCOUNTER — Other Ambulatory Visit: Payer: Self-pay | Admitting: Nurse Practitioner

## 2022-04-24 DIAGNOSIS — E782 Mixed hyperlipidemia: Secondary | ICD-10-CM

## 2022-04-24 DIAGNOSIS — I10 Essential (primary) hypertension: Secondary | ICD-10-CM

## 2022-05-04 ENCOUNTER — Encounter: Payer: Self-pay | Admitting: Nurse Practitioner

## 2022-05-04 ENCOUNTER — Ambulatory Visit (INDEPENDENT_AMBULATORY_CARE_PROVIDER_SITE_OTHER): Payer: Medicare Other | Admitting: Nurse Practitioner

## 2022-05-04 VITALS — BP 138/80 | HR 53 | Temp 97.6°F | Resp 18 | Ht 62.5 in | Wt 187.0 lb

## 2022-05-04 DIAGNOSIS — E782 Mixed hyperlipidemia: Secondary | ICD-10-CM

## 2022-05-04 DIAGNOSIS — R5383 Other fatigue: Secondary | ICD-10-CM | POA: Diagnosis not present

## 2022-05-04 DIAGNOSIS — M159 Polyosteoarthritis, unspecified: Secondary | ICD-10-CM

## 2022-05-04 DIAGNOSIS — M150 Primary generalized (osteo)arthritis: Secondary | ICD-10-CM

## 2022-05-04 DIAGNOSIS — L918 Other hypertrophic disorders of the skin: Secondary | ICD-10-CM

## 2022-05-04 DIAGNOSIS — L989 Disorder of the skin and subcutaneous tissue, unspecified: Secondary | ICD-10-CM

## 2022-05-04 DIAGNOSIS — I10 Essential (primary) hypertension: Secondary | ICD-10-CM

## 2022-05-04 NOTE — Progress Notes (Signed)
Careteam: Patient Care Team: Lauree Chandler, NP as PCP - General (Geriatric Medicine) Crista Luria, MD as Attending Physician (Dermatology) Irene Limbo, MD as Consulting Physician (Plastic Surgery) Waynette Buttery, MD as Referring Physician (Urology) Early, Arvilla Meres, MD as Consulting Physician (Vascular Surgery) Gaynelle Arabian, MD as Consulting Physician (Orthopedic Surgery) Monna Fam, MD as Consulting Physician (Ophthalmology)  PLACE OF SERVICE:  [place]  Advanced Directive information    Allergies  Allergen Reactions   Azithromycin Other (See Comments)    thrush   Latex Hives and Other (See Comments)    Lip swelling and redness    Codeine Itching   Doxycycline     Unsure of reaction    Nsaids     Stomach ulcers    Tape Rash    Latex Bandaid     Chief Complaint  Patient presents with   Medical Management of Chronic Issues    Patient is here for a 73M F/U     HPI: Patient is a 88 y.o. female here for routine follow-up.   Following with an eye doctor for possible cataract surgery.   Drinking lots of green tea and water.  Doesn't seem to have much appetite. Eats lean proteins. Yogurts, cheerios. She is sleeping well, maybe too well. Says she is fatigued due to possibly due to joint pain. She doesn't like to take tylenol. She appears well and animated today in the office.   Unable to partake in a lot of activity due to knee pain and vascular edema in lower legs. She saw vascular in November, they got her a lymphedema pump which really seems to be helping with swelling. She can see them as needed and does not have a follow-up scheduled at this time.   Incontinent of urine, uses ointment as needed. Bowels are moving fine.  Wants a referral to dermatology for skin cancer screening also has several places she wants evaluated.   Review of Systems:  Review of Systems  Constitutional:  Negative for chills, fever, malaise/fatigue and weight loss.   HENT:  Negative for congestion and sore throat.   Eyes:  Negative for blurred vision.  Respiratory:  Negative for cough, shortness of breath and wheezing.   Cardiovascular:  Negative for chest pain, palpitations and leg swelling.  Gastrointestinal:  Negative for abdominal pain, blood in stool, constipation, diarrhea, heartburn, nausea and vomiting.  Genitourinary:  Negative for dysuria, frequency, hematuria and urgency.  Musculoskeletal:  Negative for falls and joint pain.  Skin:  Negative for rash.  Neurological:  Negative for dizziness, tingling and headaches.  Endo/Heme/Allergies:  Negative for polydipsia.  Psychiatric/Behavioral:  Negative for depression. The patient is not nervous/anxious.     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 Waynette Buttery   Past Surgical History:  Procedure Laterality Date   ABLATION  2005   endovenous   APPENDECTOMY     AUGMENTATION MAMMAPLASTY Bilateral 1971   [facility] Dr. Tressa Busman   CHOLECYSTECTOMY  1986   chronic diarrhea since   COSMETIC SURGERY     LITHOTRIPSY Bilateral 2006   PERCUTANEOUS NEPHROSTOLITHOTOMY Left 06/30/2011   w/ cystoscopy by Dr. Roby Lofts at [facility] (and poss ureteral  stent placement?)   PERCUTANEOUS NEPHROSTOLITHOTOMY Left 07/07/2011   w/ ureteroscopy and stone manipulation Dr. Roby Lofts at [facility]. Surg >4 hrs with gen anesthesia and pt noticed new permanent cognitive deficits after   TOTAL HIP ARTHROPLASTY Right 12/17/2020   Procedure: TOTAL HIP ARTHROPLASTY ANTERIOR APPROACH;  Surgeon: Paralee Cancel, MD;  Location: [facility];  Service: Orthopedics;  Laterality: Right;   TUBAL LIGATION Bilateral 1968   URETEROLITHOTOMY Right 12/17/2011   Dr. Roby Lofts at [facility]   URETEROSCOPY WITH HOLMIUM LASER LITHOTRIPSY Left 07/07/2011   by percutaneous nephrostolithotomy with basket extraction, ureteral stent removal (placed 06/30/11?), and placement of Lt percutaneous nephrostomy tube by Dr. Roby Lofts at [facility] for numerous >2cm staghorn calcium oxalate stones in left renal pelvis retained after initial perc nephrostolithotomy wiht stent placement 1 wk prior    Social History:   reports that she has never smoked. She has never used smokeless tobacco. She reports that she does not drink alcohol and does not use drugs.  Family History  Problem Relation Age of Onset   Stroke Father    Hyperlipidemia Father    Lung cancer Mother    Cancer Maternal Grandmother     Medications: Patient's Medications  New Prescriptions   No medications on file  Previous Medications   ACETAMINOPHEN (TYLENOL) 500 MG TABLET    Take 500 mg by mouth as needed.   CEPHALEXIN (KEFLEX) 500 MG CAPSULE    TAKE 1 CAPSULE(500 MG) BY MOUTH DAILY FOR UTI PREVENTION   LINIMENTS (SALONPAS ARTHRITIS PAIN RELIEF EX)    Apply 1 patch topically daily as needed (pain).   NADOLOL (CORGARD) 40 MG TABLET    TAKE 1 TABLET(40 MG) BY MOUTH DAILY   NYSTATIN-TRIAMCINOLONE OINTMENT (MYCOLOG)    Apply 1 Application topically as needed.   ROSUVASTATIN (CRESTOR) 5 MG TABLET    TAKE 1 TABLET(5 MG) BY MOUTH DAILY  Modified Medications   No medications on file  Discontinued Medications   No medications  on file    Physical Exam:  Vitals:   05/04/22 1504  BP: 138/80  Pulse: (!) 53  Resp: 18  Temp: 97.6 F (36.4 C)  SpO2: 99%  Weight: 187 lb (84.8 kg)  Height: 5' 2.5" (1.588 m)   Body mass index is 33.66 kg/m. Wt Readings from Last 3 Encounters:  05/04/22 187 lb (84.8 kg)  02/02/22 190 lb (86.2 kg)  12/29/21 190 lb 3.2 oz (86.3 kg)    Physical Exam Vitals reviewed.  Constitutional:      General: She is not in acute distress.    Appearance: Normal appearance.  Cardiovascular:     Rate and Rhythm: Normal rate and regular rhythm.  Pulmonary:     Effort: No respiratory distress.     Breath sounds: Normal breath sounds.  Abdominal:     General: Bowel sounds are normal. There is no distension.     Palpations: Abdomen is soft. There is no  mass.     Tenderness: There is no abdominal tenderness. There is no guarding.  Musculoskeletal:     Cervical back: Neck supple.     Right lower leg: 4+ Pitting Edema present.     Left lower leg: 3+ Pitting Edema present.     Comments: Uses a walker for mobility  Lymphadenopathy:     Cervical: No cervical adenopathy.  Skin:    General: Skin is cool and dry.          Comments: Bluish discoloration in BLE Multiple skin tags present on lower back  Neurological:     Mental Status: She is alert and oriented to person, place, and time.  Psychiatric:        Mood and Affect: Mood normal.     Labs reviewed: Basic Metabolic Panel: Recent Labs    08/25/21 1541 12/16/21 0000  NA 141 140  K 4.1 4.1  CL 107 28*  CO2 27 28*  GLUCOSE 124  --   BUN 12 14  CREATININE 0.76 0.7  CALCIUM 9.2 8.7   Liver Function Tests: Recent Labs    08/25/21 1541 12/16/21 0000  AST 18 18  ALT 9 9  ALKPHOS  --  49  BILITOT 0.7  --   PROT 6.1  --   ALBUMIN  --  3.8   No results for input(s): "LIPASE", "AMYLASE" in the last 8760 hours. No results for input(s): "AMMONIA" in the last 8760 hours. CBC: Recent Labs    12/16/21 0000  WBC 5.8   HGB 13.2  HCT 40  PLT 153   Lipid Panel: Recent Labs    08/25/21 1541  CHOL 123  HDL 52  LDLCALC 48  TRIG 144  CHOLHDL 2.4   TSH: No results for input(s): "TSH" in the last 8760 hours. A1C: Lab Results  Component Value Date   HGBA1C 5.5 08/20/2020     Assessment/Plan 1. Skin tag Pt requests removal of skin tags to low back.  - Ambulatory referral to Dermatology  2. Dermatologic problem Pt requests skin cancer screening; has had biopsies of her nose in the past. - Ambulatory referral to Dermatology  3. Other fatigue Pt states she has been fatigued 'for weeks or months'. She reports she is eating and sleeping well.  Will get labs to ensure stability  - CBC with Differential/Platelet - Complete Metabolic Panel with eGFR - TSH  4. Hyperlipidemia, mixed Continue rosuvastatin. Patient endorses eating a healthy diet.   5. Primary hypertension BP stable today, continue nadolol.   6. Primary osteoarthritis involving multiple joints Continue tylenol as needed with pain relief patches.    Return in about 6 months (around 11/02/2022) for routine follow up .  Student- Archer Asa O'Berry ACPCNP-S  I personally was present during the history, physical exam and medical decision-making activities of this service and have verified that the service and findings are accurately documented in the student's note Marcin Holte K. [place], [facility] [phone]

## 2022-05-05 LAB — COMPLETE METABOLIC PANEL WITH GFR
AG Ratio: 1.5 (calc) (ref 1.0–2.5)
ALT: 9 U/L (ref 6–29)
AST: 17 U/L (ref 10–35)
Albumin: 3.8 g/dL (ref 3.6–5.1)
Alkaline phosphatase (APISO): 51 U/L (ref 37–153)
BUN: 16 mg/dL (ref 7–25)
CO2: 27 mmol/L (ref 20–32)
Calcium: 9 mg/dL (ref 8.6–10.4)
Chloride: 107 mmol/L (ref 98–110)
Creat: 0.69 mg/dL (ref 0.60–0.95)
Globulin: 2.6 g/dL (calc) (ref 1.9–3.7)
Glucose, Bld: 110 mg/dL — ABNORMAL HIGH (ref 65–99)
Potassium: 4.8 mmol/L (ref 3.5–5.3)
Sodium: 143 mmol/L (ref 135–146)
Total Bilirubin: 0.7 mg/dL (ref 0.2–1.2)
Total Protein: 6.4 g/dL (ref 6.1–8.1)
eGFR: 85 mL/min/{1.73_m2} (ref >=60)

## 2022-05-05 LAB — TSH: TSH: 0.99 mIU/L (ref 0.40–4.50)

## 2022-05-05 LAB — CBC WITH DIFFERENTIAL/PLATELET
Absolute Monocytes: 710 cells/uL (ref 200–950)
Basophils Absolute: 19 cells/uL (ref 0–200)
Basophils Relative: 0.3 %
Eosinophils Absolute: 128 cells/uL (ref 15–500)
Eosinophils Relative: 2 %
HCT: 43.4 % (ref 35.0–45.0)
Hemoglobin: 13.9 g/dL (ref 11.7–15.5)
Lymphs Abs: 2067 cells/uL (ref 850–3900)
MCH: 27.9 pg (ref 27.0–33.0)
MCHC: 32 g/dL (ref 32.0–36.0)
MCV: 87 fL (ref 80.0–100.0)
MPV: 10.6 fL (ref 7.5–12.5)
Monocytes Relative: 11.1 %
Neutro Abs: 3475 cells/uL (ref 1500–7800)
Neutrophils Relative %: 54.3 %
Platelets: 169 10*3/uL (ref 140–400)
RBC: 4.99 10*6/uL (ref 3.80–5.10)
RDW: 14 % (ref 11.0–15.0)
Total Lymphocyte: 32.3 %
WBC: 6.4 10*3/uL (ref 3.8–10.8)

## 2022-09-29 ENCOUNTER — Other Ambulatory Visit: Payer: Self-pay | Admitting: Nurse Practitioner

## 2022-09-29 DIAGNOSIS — Z1231 Encounter for screening mammogram for malignant neoplasm of breast: Secondary | ICD-10-CM

## 2022-10-14 ENCOUNTER — Ambulatory Visit: Admission: RE | Admit: 2022-10-14 | Payer: Medicare Other | Source: Ambulatory Visit

## 2022-10-14 DIAGNOSIS — Z1231 Encounter for screening mammogram for malignant neoplasm of breast: Secondary | ICD-10-CM

## 2022-10-26 ENCOUNTER — Encounter: Payer: Self-pay | Admitting: Nurse Practitioner

## 2022-10-26 ENCOUNTER — Ambulatory Visit (INDEPENDENT_AMBULATORY_CARE_PROVIDER_SITE_OTHER): Payer: Medicare Other | Admitting: Nurse Practitioner

## 2022-10-26 VITALS — BP 126/84 | HR 52 | Temp 97.7°F | Ht 62.0 in | Wt 189.0 lb

## 2022-10-26 DIAGNOSIS — E2839 Other primary ovarian failure: Secondary | ICD-10-CM | POA: Diagnosis not present

## 2022-10-26 DIAGNOSIS — Z0000 Encounter for general adult medical examination without abnormal findings: Secondary | ICD-10-CM

## 2022-10-26 NOTE — Progress Notes (Signed)
Subjective:   Susan Boone is a 88 y.o. female who presents for Medicare Annual (Subsequent) preventive examination.  Visit Complete: In person at [facility]  Patient Medicare AWV questionnaire was completed by the patient on 8/5; I have confirmed that all information answered by patient is correct and no changes since this date.  Review of Systems     Cardiac Risk Factors include: advanced age (>68men, >50 women);family history of premature cardiovascular disease;sedentary lifestyle;dyslipidemia;obesity (BMI >30kg/m2)     Objective:    Today's Vitals   10/26/22 1507  BP: 126/84  Pulse: (!) 52  Temp: 97.7 F (36.5 C)  TempSrc: Temporal  SpO2: 99%  Weight: 189 lb (85.7 kg)  Height: 5\' 2"  (1.575 m)   Body mass index is 34.57 kg/m.     10/26/2022    3:00 PM 10/23/2021    2:44 PM 08/25/2021    2:46 PM 12/17/2020    5:23 PM 12/06/2020    2:20 PM 09/27/2020   10:25 AM 08/21/2020    3:06 PM  Advanced Directives  Does Patient Have a Medical Advance Directive? Yes Yes Yes Yes Yes No No  Type of Estate agent of [place];Living will Healthcare Power of [place];Living will Healthcare Power of [place];Living will Healthcare Power of [place];Living will Healthcare Power of [place];Living will    Does patient want to make changes to medical advance directive? No - Patient declined Yes (MAU/Ambulatory/Procedural Areas - Information given) No - Patient declined No - Patient declined     Copy of Healthcare Power of Attorney in Chart? Yes - validated most recent copy scanned in chart (See row information) Yes - validated most recent copy scanned in chart (See row information) Yes - validated most recent copy scanned in chart (See row information) No - copy requested No - copy requested    Would patient like information on creating a medical advance directive?      No - Patient declined No - Patient declined    Current Medications (verified) Outpatient Encounter Medications  as of 10/26/2022  Medication Sig   acetaminophen (TYLENOL) 500 MG tablet Take 500 mg by mouth as needed.   cephALEXin (KEFLEX) 500 MG capsule TAKE 1 CAPSULE(500 MG) BY MOUTH DAILY FOR UTI PREVENTION   Liniments (SALONPAS ARTHRITIS PAIN RELIEF EX) Apply 1 patch topically daily as needed (pain).   nadolol (CORGARD) 40 MG tablet TAKE 1 TABLET(40 MG) BY MOUTH DAILY   nystatin-triamcinolone ointment (MYCOLOG) Apply 1 Application topically as needed.   rosuvastatin (CRESTOR) 5 MG tablet TAKE 1 TABLET(5 MG) BY MOUTH DAILY   No facility-administered encounter medications on file as of 10/26/2022.    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 Wilburn Mylar   Past Surgical History:  Procedure Laterality Date   ABLATION  2005   endovenous   APPENDECTOMY     AUGMENTATION MAMMAPLASTY Bilateral 1971   [facility] Dr. Tyrell Antonio   CHOLECYSTECTOMY  1986   chronic diarrhea since   COSMETIC SURGERY     LITHOTRIPSY Bilateral 2006   PERCUTANEOUS NEPHROSTOLITHOTOMY Left 06/30/2011   w/ cystoscopy by Dr. Karena Addison at [facility] (and poss ureteral stent placement?)    PERCUTANEOUS NEPHROSTOLITHOTOMY Left 07/07/2011   w/ ureteroscopy and stone manipulation Dr. Karena Addison at [facility]. Surg >4 hrs with gen anesthesia and pt noticed new permanent cognitive deficits after   TOTAL HIP ARTHROPLASTY Right 12/17/2020   Procedure: TOTAL HIP ARTHROPLASTY ANTERIOR APPROACH;  Surgeon: Durene Romans, MD;  Location: [facility];  Service: Orthopedics;  Laterality: Right;   TUBAL LIGATION Bilateral 1968   URETEROLITHOTOMY Right 12/17/2011   Dr. Karena Addison at [facility]   URETEROSCOPY WITH HOLMIUM LASER LITHOTRIPSY Left 07/07/2011   by percutaneous nephrostolithotomy with basket extraction, ureteral stent removal (placed 06/30/11?), and placement of Lt percutaneous nephrostomy tube by Dr. Karena Addison at [facility] for numerous >2cm staghorn calcium oxalate stones in left renal pelvis retained after initial perc nephrostolithotomy wiht stent placement 1 wk prior    Family History  Problem Relation Age of Onset   Stroke Father    Hyperlipidemia Father    Lung cancer Mother    Cancer Maternal Grandmother    Social History   Socioeconomic History   Marital status: Divorced    Spouse name: Not on file   Number of children: Not on file   Years of education: Not on file   Highest education level: Not on file  Occupational History   Occupation: retired  Tobacco Use   Smoking status: Never   Smokeless tobacco: Never   Tobacco comments:    Smoked a little bit, never a habit to break (no struggle)   Vaping Use   Vaping status: Never Used  Substance and Sexual Activity   Alcohol use: No    Alcohol/week: 0.0 standard drinks of alcohol   Drug use: No   Sexual activity: Not on file  Other Topics Concern   Not on file  Social History Narrative   Previously had occasional 1-2 cigarettes but none since 1981   Rarely has caffeine and not coffee   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   She has challenges preparing meals and bathing and dressing due to mobility   Social Determinants of Health   Financial Resource Strain: Not on file  Food Insecurity: Not on file  Transportation Needs: Not on file  Physical Activity: Not on file  Stress: Not on file  Social Connections: Not on file    Tobacco Counseling Counseling given: Not Answered Tobacco comments: Smoked a little bit, never a habit to break (no struggle)    Clinical Intake:  Pre-visit preparation completed: Yes  Pain : No/denies pain     BMI - recorded: 34 Nutritional Status: BMI > 30  Obese Diabetes: No  How often do you need to have someone help you when you read instructions, pamphlets, or other  written materials from your doctor or pharmacy?: 1 - Never         Activities of Daily Living    10/26/2022    3:25 PM  In your present state of health, do you have any difficulty performing the following activities:  Hearing? 0  Vision? 1  Difficulty concentrating or making decisions? 0  Walking or climbing stairs? 1  Dressing or bathing? 1  Comment still able to do it but hard  Doing errands, shopping? 1  Comment due to mobility  Preparing Food and eating ? Y  Comment hard to stand up long enough to cook  Using the Toilet? Y  Comment has a grab bar  In the past six months, have you accidently leaked urine? Y  Do you have problems with loss of bowel control? N  Managing your Medications? N  Managing your Finances? N  Housekeeping or managing your Housekeeping? Y  Comment physically has a hard time    Patient Care Team: Sharon Seller, NP as PCP - General (Geriatric Medicine) Campbell Stall, MD as Attending Physician (Dermatology) Glenna Fellows, MD as Consulting Physician (Plastic Surgery) Wilburn Mylar, MD as Referring Physician (Urology) Early, Kristen Loader, MD (Inactive) as Consulting  Physician (Vascular Surgery) Ollen Gross, MD as Consulting Physician (Orthopedic Surgery) Mateo Flow, MD as Consulting Physician (Ophthalmology)  Indicate any recent Medical Services you may have received from other than [facility] providers in the past year (date may be approximate).     Assessment:   This is a routine wellness examination for [facility].  Hearing/Vision screen Hearing Screening - Comments:: No hearing issues Vision Screening - Comments:: Last eye exam greater than 12 months ago. Dr.Hecker. Patient would like recommendation for a new eye doctor.   Dietary issues and exercise activities discussed:     Goals Addressed   None    Depression Screen    10/26/2022    3:05 PM 10/23/2021    2:43 PM 04/21/2021    4:27 PM 06/04/2020    2:58 PM 04/18/2020    4:01 PM 12/18/2019    3:30 PM 04/08/2017    2:27 PM  PHQ 2/9 Scores  PHQ - 2 Score 0 0 0 0 0 0 0    Fall Risk    10/26/2022    3:05 PM 12/29/2021    2:42 PM 10/23/2021    2:43 PM 04/21/2021    4:22 PM 06/04/2020    3:01 PM  Fall Risk   Falls in the past year? 0 0 0 0 0  Number falls in past yr: 0 0 0 0 0  Injury with Fall? 0 0 0 0 0  Risk for fall due to : No Fall Risks No Fall Risks No Fall Risks No Fall Risks   Follow up Falls evaluation completed  Falls evaluation completed Falls evaluation completed     MEDICARE RISK AT HOME:   TIMED UP AND GO:  Was the test performed?  No    Cognitive Function:    10/26/2022    3:14 PM  MMSE - Mini Mental State Exam  Orientation to time 5  Orientation to Place 5  Registration 3  Attention/ Calculation 5  Recall 2  Language- name 2 objects 2  Language- repeat 1  Language- follow 3 step command 3  Language- read & follow direction 0  Language-read & follow direction-comments Did not read out loud  Write a sentence 1  Copy design 1  Total score 28  10/23/2021    2:47 PM 06/04/2020    3:06 PM 12/24/2016    3:57 PM  6CIT Screen  What Year? 0 points 0 points  0 points  What month? 0 points 0 points 0 points  What time? 0 points 0 points 0 points  Count back from 20 0 points 0 points 0 points  Months in reverse 0 points 0 points 0 points  Repeat phrase 0 points 2 points 0 points  Total Score 0 points 2 points 0 points    Immunizations Immunization History  Administered Date(s) Administered   Fluad Quad(high Dose 65+) 12/18/2019, 12/29/2021   Influenza, High Dose Seasonal PF 12/23/2016, 12/30/2017, 11/28/2020   Influenza-Unspecified 12/21/2013, 12/21/2016, 11/22/2018   PFIZER(Purple Top)SARS-COV-2 Vaccination 04/07/2019, 04/28/2019, 01/01/2020, 07/31/2020   Pfizer Covid-19 Vaccine Bivalent Booster 50yrs & up 11/28/2020   Pneumococcal Conjugate-13 02/06/2015   Pneumococcal Polysaccharide-23 02/11/2002   Tdap 02/23/2013   Zoster, Live 03/23/2014    TDAP status: Up to date  Flu Vaccine status: Due, Education has been provided regarding the importance of this vaccine. Advised may receive this vaccine at local pharmacy or Health Dept. Aware to provide a copy of the vaccination record if obtained from local pharmacy or Health Dept. Verbalized acceptance and understanding.  Pneumococcal vaccine status: Up to date  Covid-19 vaccine status: Information provided on how to obtain vaccines.   Qualifies for Shingles Vaccine? Yes   Zostavax completed No   Shingrix Completed?: No.    Education has been provided regarding the importance of this vaccine. Patient has been advised to call insurance company to determine out of pocket expense if they have not yet received this vaccine. Advised may also receive vaccine at local pharmacy or Health Dept. Verbalized acceptance and understanding.  Screening Tests Health Maintenance  Topic Date Due   Zoster Vaccines- Shingrix (1 of 2) 09/09/1953   COVID-19 Vaccine (6 - 2023-24 season) 11/21/2021   INFLUENZA VACCINE  10/22/2022   DTaP/Tdap/Td (2 - Td or Tdap) 02/24/2023   Medicare Annual Wellness (AWV)   10/26/2023   Pneumonia Vaccine 71+ Years old  Completed   DEXA SCAN  Completed   HPV VACCINES  Aged Out    Health Maintenance  Health Maintenance Due  Topic Date Due   Zoster Vaccines- Shingrix (1 of 2) 09/09/1953   COVID-19 Vaccine (6 - 2023-24 season) 11/21/2021   INFLUENZA VACCINE  10/22/2022    Colorectal cancer screening: No longer required.   Mammogram status: No longer required due to age.  Bone Density status: Ordered today. Pt provided with contact info and advised to call to schedule appt.  Lung Cancer Screening: (Low Dose CT Chest recommended if Age 63-80 years, 20 pack-year currently smoking OR have quit w/in 15years.) does not qualify.   Lung Cancer Screening Referral: na  Additional Screening:  Hepatitis C Screening: does not qualify  Vision Screening: Recommended annual ophthalmology exams for early detection of glaucoma and other disorders of the eye. Is the patient up to date with their annual eye exam?  No  Who is the provider or what is the name of the office in which the patient attends annual eye exams? Plans to go back to [facility] If pt is not established with a provider, would they like to be referred to a provider to establish care? No .   Dental Screening: Recommended annual dental exams for proper oral hygiene    Community Resource Referral / Chronic Care Management: CRR required this visit?  No  CCM required this visit?  No     Plan:     I have personally reviewed and noted the following in the patient's chart:   Medical and social history Use of alcohol, tobacco or illicit drugs  Current medications and supplements including opioid prescriptions. Patient is not currently taking opioid prescriptions. Functional ability and status Nutritional status Physical activity Advanced directives List of other physicians Hospitalizations, surgeries, and ER visits in previous 12 months Vitals Screenings to include cognitive, depression, and  falls Referrals and appointments  In addition, I have reviewed and discussed with patient certain preventive protocols, quality metrics, and best practice recommendations. A written personalized care plan for preventive services as well as general preventive health recommendations were provided to patient.     Sharon Seller, NP   10/26/2022

## 2022-10-26 NOTE — Patient Instructions (Signed)
  Ms. Hauver , Thank you for taking time to come for your Medicare Wellness Visit. I appreciate your ongoing commitment to your health goals. Please review the following plan we discussed and let me know if I can assist you in the future.   These are the goals we discussed:  Goals      Exercise (walking)     Patient wants to try to decrease her pain so she can start walking more daily to increase her exercise.         This is a list of the screening recommended for you and due dates:  Health Maintenance  Topic Date Due   Zoster (Shingles) Vaccine (1 of 2) 09/09/1953   COVID-19 Vaccine (6 - 2023-24 season) 11/21/2021   Flu Shot  10/22/2022   DTaP/Tdap/Td vaccine (2 - Td or Tdap) 02/24/2023   Medicare Annual Wellness Visit  10/26/2023   Pneumonia Vaccine  Completed   DEXA scan (bone density measurement)  Completed   HPV Vaccine  Aged Out    To get COVID and flu shot at your local pharmacy in October  Due to shingles vaccine- to get at local pharmacy

## 2022-11-02 ENCOUNTER — Ambulatory Visit: Payer: Medicare Other | Admitting: Nurse Practitioner

## 2022-11-16 ENCOUNTER — Ambulatory Visit: Payer: Medicare Other | Admitting: Nurse Practitioner

## 2022-12-14 ENCOUNTER — Ambulatory Visit: Payer: Medicare Other | Admitting: Nurse Practitioner

## 2022-12-15 IMAGING — MG DIGITAL SCREENING BREAST BILAT IMPLANT W/ TOMO W/ CAD
8 of 12 series · 8 of 28 positions shown · non-contrast
Comparison: Previous exam(s).

CLINICAL DATA: Screening.

EXAM:
DIGITAL SCREENING BILATERAL MAMMOGRAM WITH IMPLANTS, CAD AND
TOMOSYNTHESIS
TECHNIQUE: Bilateral screening digital craniocaudal and mediolateral oblique
mammograms were obtained. Bilateral screening digital breast
tomosynthesis was performed. The images were evaluated with
computer-aided detection. Standard and/or implant displaced views
were performed.

[R MLO]
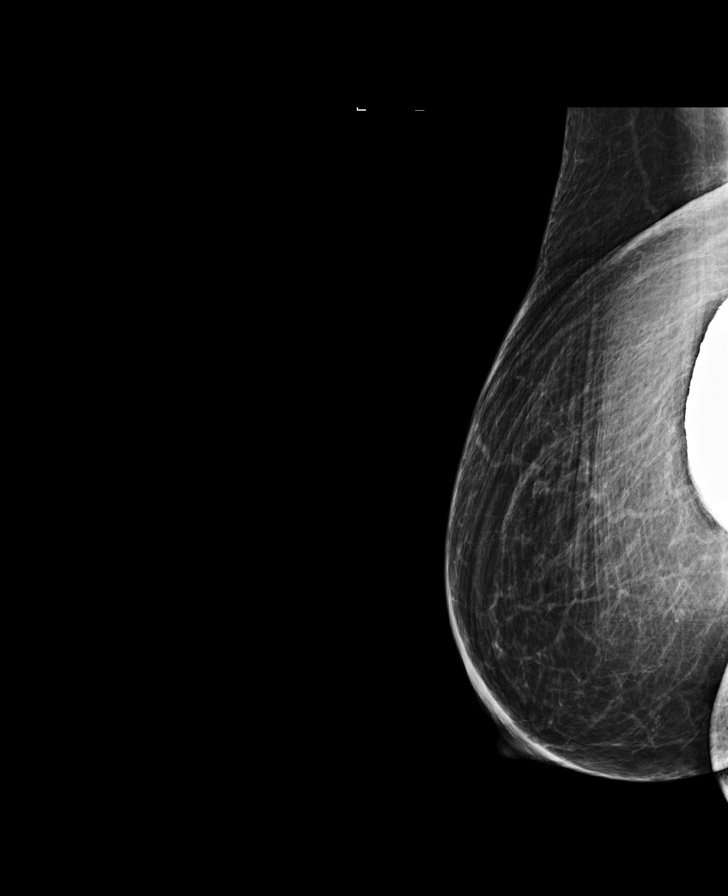

[L MLO]
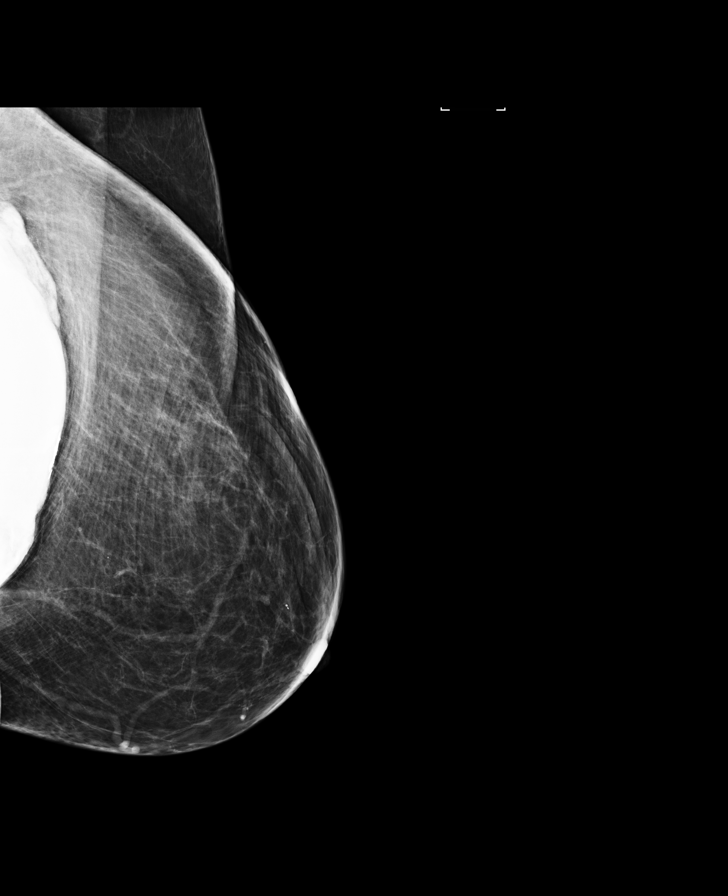

[R CC]
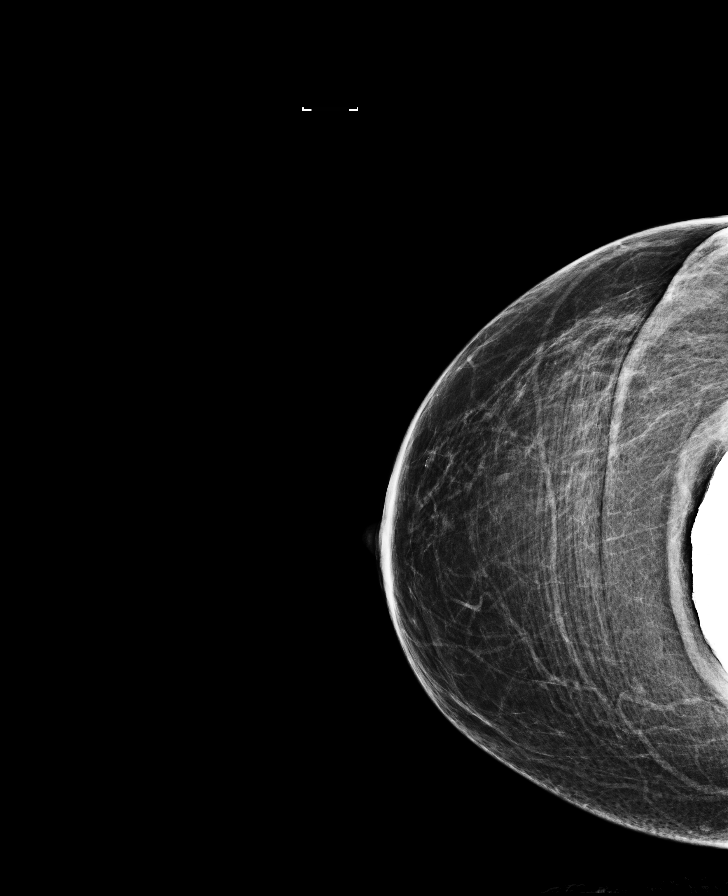

[L CC]
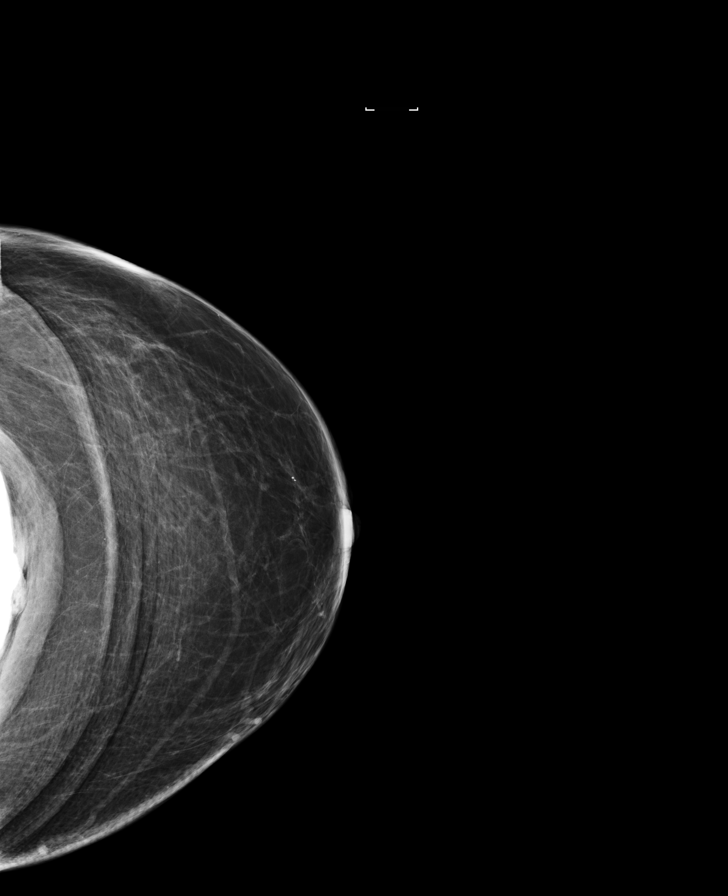

[R MLO synth-2D]
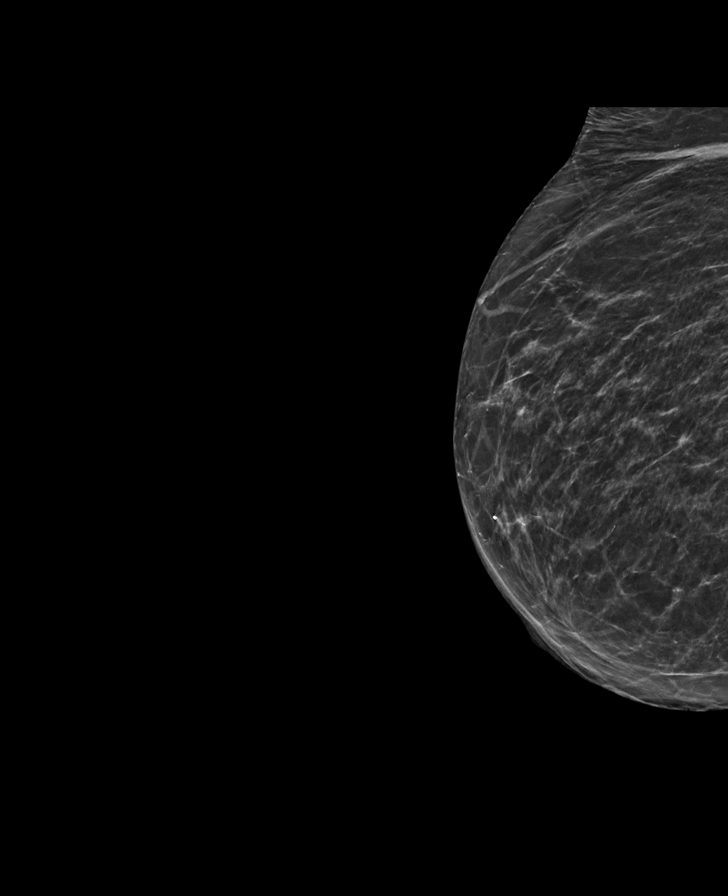

[L CC synth-2D]
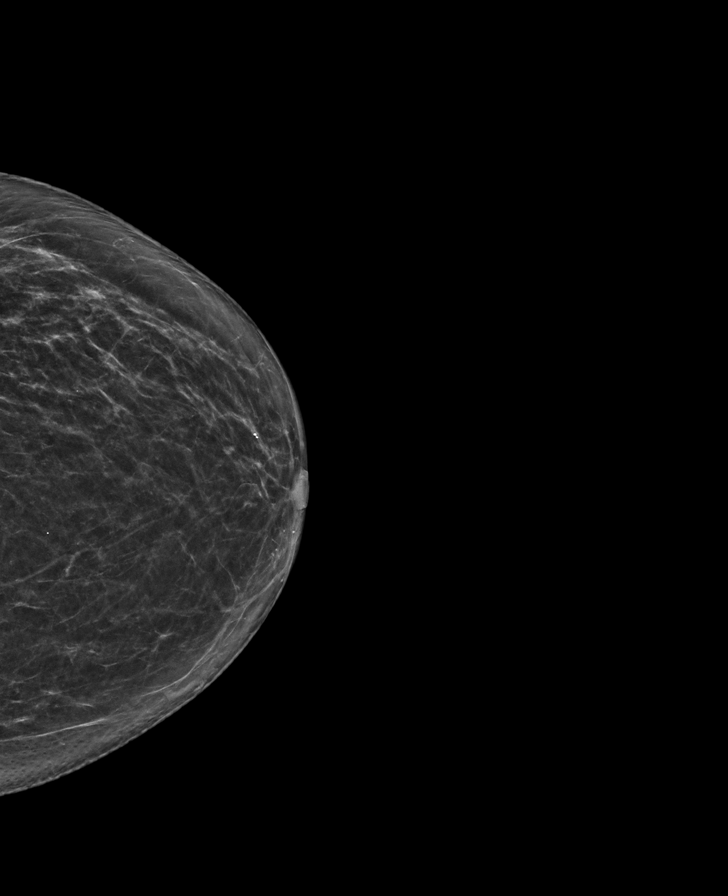

[L MLO synth-2D]
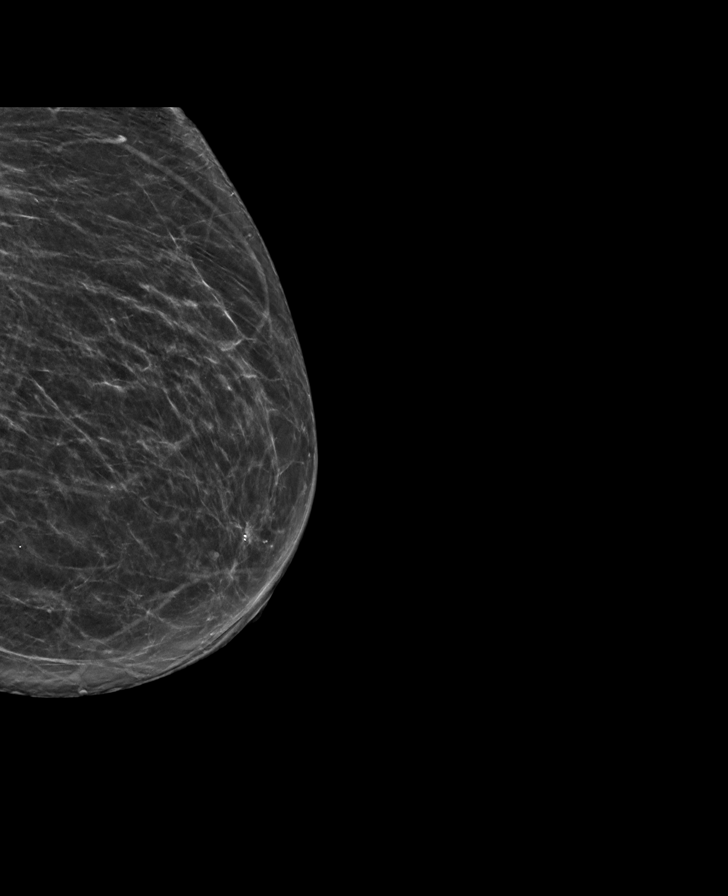

[R CC synth-2D]
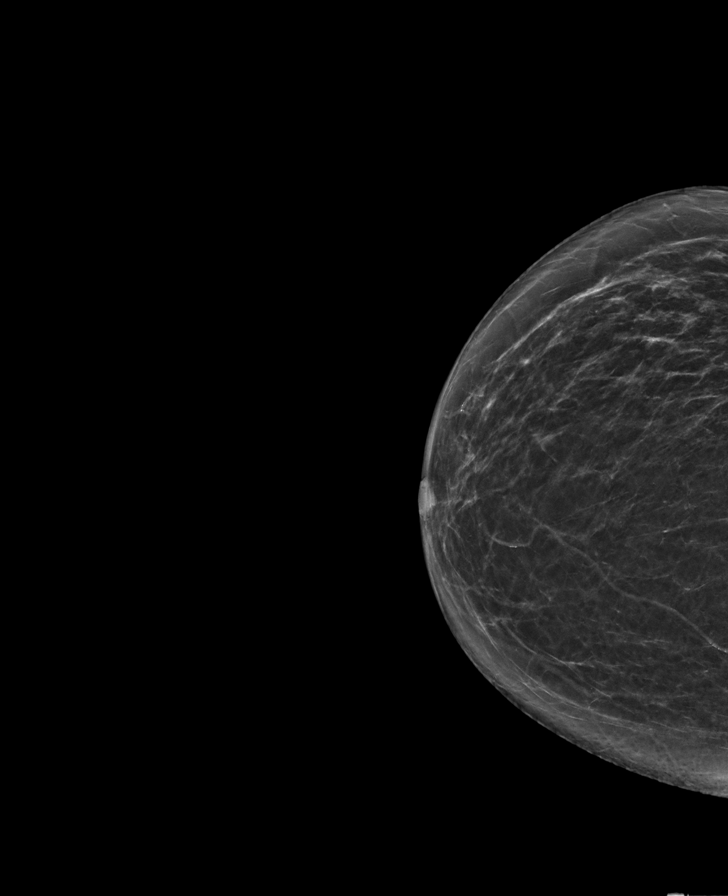

[8 of 28 positions shown; findings below may reference images not displayed]

ACR Breast Density Category b: There are scattered areas of
fibroglandular density.
FINDINGS: The patient has retropectoral implants. There are no findings
suspicious for malignancy.
IMPRESSION: No mammographic evidence of malignancy. A result letter of this
screening mammogram will be mailed directly to the patient.

RECOMMENDATION:
Screening mammogram in one year. (Code:SE-S-JMG)

BI-RADS CATEGORY  1:  Negative.

## 2023-01-04 ENCOUNTER — Encounter: Payer: Self-pay | Admitting: Nurse Practitioner

## 2023-01-04 ENCOUNTER — Ambulatory Visit (INDEPENDENT_AMBULATORY_CARE_PROVIDER_SITE_OTHER): Payer: Medicare Other | Admitting: Nurse Practitioner

## 2023-01-04 VITALS — BP 124/78 | HR 52 | Temp 97.4°F | Ht 62.0 in | Wt 184.8 lb

## 2023-01-04 DIAGNOSIS — R739 Hyperglycemia, unspecified: Secondary | ICD-10-CM | POA: Diagnosis not present

## 2023-01-04 DIAGNOSIS — M150 Primary generalized (osteo)arthritis: Secondary | ICD-10-CM

## 2023-01-04 DIAGNOSIS — I872 Venous insufficiency (chronic) (peripheral): Secondary | ICD-10-CM

## 2023-01-04 DIAGNOSIS — N390 Urinary tract infection, site not specified: Secondary | ICD-10-CM

## 2023-01-04 DIAGNOSIS — I10 Essential (primary) hypertension: Secondary | ICD-10-CM | POA: Diagnosis not present

## 2023-01-04 DIAGNOSIS — E782 Mixed hyperlipidemia: Secondary | ICD-10-CM | POA: Diagnosis not present

## 2023-01-04 DIAGNOSIS — E6609 Other obesity due to excess calories: Secondary | ICD-10-CM

## 2023-01-04 DIAGNOSIS — Z6834 Body mass index (BMI) 34.0-34.9, adult: Secondary | ICD-10-CM

## 2023-01-04 DIAGNOSIS — E66811 Obesity, class 1: Secondary | ICD-10-CM

## 2023-01-04 DIAGNOSIS — Z23 Encounter for immunization: Secondary | ICD-10-CM

## 2023-01-04 NOTE — Progress Notes (Signed)
Careteam: Patient Care Team: Sharon Seller, NP as PCP - General (Geriatric Medicine) Campbell Stall, MD as Attending Physician (Dermatology) Glenna Fellows, MD as Consulting Physician (Plastic Surgery) Wilburn Mylar, MD as Referring Physician (Urology) Early, Kristen Loader, MD (Inactive) as Consulting Physician (Vascular Surgery) Ollen Gross, MD as Consulting Physician (Orthopedic Surgery) Mateo Flow, MD as Consulting Physician (Ophthalmology)  PLACE OF SERVICE:  [facility]  Advanced Directive information Does Patient Have a Medical Advance Directive?: Yes, Type of Advance Directive: Healthcare Power of [place];Living will;Out of facility DNR (pink MOST or yellow form), Pre-existing out of facility DNR order (yellow form or pink MOST form): Pink MOST form placed in chart (order not valid for inpatient use), Does patient want to make changes to medical advance directive?: No - Patient declined  Allergies  Allergen Reactions   Azithromycin Other (See Comments)    thrush   Latex Hives and Other (See Comments)    Lip swelling and redness    Codeine Itching   Doxycycline     Unsure of reaction    Nsaids     Stomach ulcers    Tape Rash    Latex Bandaid     Chief Complaint  Patient presents with   Medical Management of Chronic Issues    6 month follow-up. Discussed need for shingrix, flu vaccine (given today), and covid booster. Patient states " I feel used up, low stamina."      HPI: Patient is a 88 y.o. female presents for a 31-month follow-up.  Migraine headaches/HTN: denies any headaches now, BP controlled. HLD: taking rosuvastatin, not following a specific diet, not making dietary modifications but tries to be mindful, does eat yogurt everyday. Is not always hungry, has lost some weight, can tell by her clothes being loose. Tries to make sure she gets enough protein.   Bilateral lower extremity edema persists, greater on right than left and reports has  been for many years. Had varicose vein surgery on left leg, states left leg might be better for this reason.  Denies recent UTI, h/o kidney stones and was following with urology but is no longer seeing anyone at this time. Chronic urinary incontinence (for about 11 years), wears depends, on keflex prophylaxis for UTI. Denies problems with yeast.   OA- rarely taking tylenol PRN, stretching helps. But isn't able to walk very far.  Lives with her significant other of 40 some years. Denies falls.   Review of Systems:  Review of Systems  Constitutional:  Positive for weight loss. Negative for chills, fever and malaise/fatigue.  Respiratory:  Negative for cough and shortness of breath.   Cardiovascular:  Positive for leg swelling. Negative for chest pain and palpitations.  Gastrointestinal:  Negative for abdominal pain, constipation, diarrhea, nausea and vomiting.  Genitourinary:  Positive for frequency and urgency. Negative for dysuria.  Musculoskeletal:  Positive for joint pain. Negative for myalgias.  Neurological:  Negative for dizziness, weakness and headaches.  Psychiatric/Behavioral:  Negative for depression. The patient is not nervous/anxious and does not have insomnia.     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 Wilburn Mylar   Past Surgical History:  Procedure Laterality Date   ABLATION  2005   endovenous   APPENDECTOMY     AUGMENTATION MAMMAPLASTY Bilateral 1971   [facility] Dr. Tyrell Antonio   CHOLECYSTECTOMY  1986   chronic diarrhea since   COSMETIC SURGERY     LITHOTRIPSY Bilateral 2006   PERCUTANEOUS NEPHROSTOLITHOTOMY Left 06/30/2011   w/ cystoscopy by Dr. Karena Addison at [facility] (and poss ureteral stent placement?)   PERCUTANEOUS NEPHROSTOLITHOTOMY Left 07/07/2011   w/ ureteroscopy and stone manipulation Dr. Karena Addison at [facility] Surg >4 hrs with gen anesthesia and pt noticed new permanent cognitive deficits after   TOTAL HIP ARTHROPLASTY Right 12/17/2020   Procedure: TOTAL HIP ARTHROPLASTY ANTERIOR APPROACH;  Surgeon: Durene Romans, MD;  Location: [facility];  Service: Orthopedics;  Laterality: Right;   TUBAL LIGATION Bilateral 1968   URETEROLITHOTOMY Right 12/17/2011   Dr. Karena Addison at [facility]   URETEROSCOPY WITH HOLMIUM LASER LITHOTRIPSY Left 07/07/2011   by percutaneous nephrostolithotomy with basket extraction, ureteral stent removal (placed 06/30/11?), and placement of Lt percutaneous nephrostomy tube by Dr. Karena Addison at [facility] for numerous >2cm staghorn calcium oxalate stones in left renal pelvis retained after initial perc nephrostolithotomy wiht stent placement 1 wk prior    Social History:   reports that she has never smoked. She has never used smokeless tobacco. She reports that she does not drink alcohol and does not use drugs.  Family History  Problem Relation Age of Onset   Stroke Father    Hyperlipidemia Father    Lung cancer Mother    Cancer Maternal Grandmother     Medications: Patient's Medications  New Prescriptions   No medications on file  Previous Medications   ACETAMINOPHEN (TYLENOL) 500 MG TABLET    Take 500 mg by mouth as needed.   CEPHALEXIN (KEFLEX) 500 MG CAPSULE     TAKE 1 CAPSULE(500 MG) BY MOUTH DAILY FOR UTI PREVENTION   LINIMENTS (SALONPAS ARTHRITIS PAIN RELIEF EX)    Apply 1 patch topically daily as needed (pain).   NADOLOL (CORGARD) 40 MG TABLET    TAKE 1 TABLET(40 MG) BY MOUTH DAILY   NYSTATIN-TRIAMCINOLONE OINTMENT (MYCOLOG)    Apply 1 Application topically as needed.   ROSUVASTATIN (CRESTOR) 5 MG TABLET    TAKE 1 TABLET(5 MG) BY MOUTH DAILY  Modified Medications   No medications on file  Discontinued Medications   No medications on file    Physical Exam:  Vitals:   01/04/23 1322  BP: 124/78  Pulse: (!) 52  Temp: (!) 97.4 F (36.3 C)  TempSrc: Temporal  SpO2: 97%  Weight: 83.8 kg  Height: 5\' 2"  (1.575 m)   Body mass index is 33.8 kg/m. Wt Readings from Last 3 Encounters:  01/04/23 83.8 kg  10/26/22 85.7 kg  05/04/22 84.8 kg    Physical Exam Constitutional:      General: She is not in acute distress.    Appearance: Normal appearance. She is obese. She is not ill-appearing.  Cardiovascular:  Rate and Rhythm: Normal rate and regular rhythm.     Pulses: Normal pulses.     Heart sounds: Normal heart sounds.  Pulmonary:     Effort: Pulmonary effort is normal.     Breath sounds: Normal breath sounds.  Abdominal:     General: Bowel sounds are normal.     Palpations: Abdomen is soft.  Musculoskeletal:     Right lower leg: Edema (greater than left) present.     Left lower leg: Edema present.  Skin:    General: Skin is warm and dry.     Comments: Purple/blue discoloration on bilateral feet  Neurological:     General: No focal deficit present.     Mental Status: She is alert and oriented to person, place, and time. Mental status is at baseline.  Psychiatric:        Mood and Affect: Mood normal.        Behavior: Behavior normal.     Labs reviewed: Basic Metabolic Panel: Recent Labs    05/04/22 1546  NA 143  K 4.8  CL 107  CO2 27  GLUCOSE 110*  BUN 16  CREATININE 0.69  CALCIUM 9.0  TSH 0.99   Liver  Function Tests: Recent Labs    05/04/22 1546  AST 17  ALT 9  BILITOT 0.7  PROT 6.4   No results for input(s): "LIPASE", "AMYLASE" in the last 8760 hours. No results for input(s): "AMMONIA" in the last 8760 hours. CBC: Recent Labs    05/04/22 1546  WBC 6.4  NEUTROABS 3,475  HGB 13.9  HCT 43.4  MCV 87.0  PLT 169   Lipid Panel: No results for input(s): "CHOL", "HDL", "LDLCALC", "TRIG", "CHOLHDL", "LDLDIRECT" in the last 8760 hours. TSH: Recent Labs    05/04/22 1546  TSH 0.99   A1C: Lab Results  Component Value Date   HGBA1C 5.5 08/20/2020     Assessment/Plan 1. Primary hypertension -Controlled, at goal, <140/90; continue current medication regimen -Encouraged dietary modifications/DASH diet and physical activity as tolerated - COMPLETE METABOLIC PANEL WITH GFR - CBC with Differential/Platelet  2. Hyperlipidemia, mixed -Encouraged dietary modifications and physical activity as tolerated -Continue rosuvastatin - Lipid panel  3. Primary osteoarthritis involving multiple joints -Controlled, continue PRN tylenol  4. Hyperglycemia -Encouraged dietary modifications - Hemoglobin A1c  5. Venous insufficiency of both lower extremities -Encouraged leg elevation above level of heart as tolerated -Encouraged low sodium diet, compression socks as tolerated during the day  6. Class 1 obesity due to excess calories with serious comorbidity and body mass index (BMI) of 34.0 to 34.9 in adult -has had weight loss, encouraged dietary modifications and physical activity as tolerated  7. Recurrent UTI -Continue Keflex, no recent UTI  8. Need for influenza vaccination - Flu Vaccine Trivalent High Dose (Fluad)  Return in about 6 months (around 07/05/2023) for routine follow up .  Rollen Sox, FNP-MSN Student I personally was present during the history, physical exam and medical decision-making activities of this service and have verified that the service and  findings are accurately documented in the student's note  Abbey Chatters, NP

## 2023-01-05 LAB — CBC WITH DIFFERENTIAL/PLATELET
Absolute Monocytes: 435 {cells}/uL (ref 200–950)
Basophils Absolute: 20 {cells}/uL (ref 0–200)
Basophils Relative: 0.4 %
Eosinophils Absolute: 190 {cells}/uL (ref 15–500)
Eosinophils Relative: 3.8 %
HCT: 42.8 % (ref 35.0–45.0)
Hemoglobin: 13.7 g/dL (ref 11.7–15.5)
Lymphs Abs: 1595 {cells}/uL (ref 850–3900)
MCH: 27.7 pg (ref 27.0–33.0)
MCHC: 32 g/dL (ref 32.0–36.0)
MCV: 86.5 fL (ref 80.0–100.0)
MPV: 10.7 fL (ref 7.5–12.5)
Monocytes Relative: 8.7 %
Neutro Abs: 2760 {cells}/uL (ref 1500–7800)
Neutrophils Relative %: 55.2 %
Platelets: 158 10*3/uL (ref 140–400)
RBC: 4.95 10*6/uL (ref 3.80–5.10)
RDW: 13.9 % (ref 11.0–15.0)
Total Lymphocyte: 31.9 %
WBC: 5 10*3/uL (ref 3.8–10.8)

## 2023-01-05 LAB — COMPLETE METABOLIC PANEL WITH GFR
AG Ratio: 1.6 (calc) (ref 1.0–2.5)
ALT: 11 U/L (ref 6–29)
AST: 17 U/L (ref 10–35)
Albumin: 3.8 g/dL (ref 3.6–5.1)
Alkaline phosphatase (APISO): 51 U/L (ref 37–153)
BUN: 19 mg/dL (ref 7–25)
CO2: 27 mmol/L (ref 20–32)
Calcium: 8.9 mg/dL (ref 8.6–10.4)
Chloride: 107 mmol/L (ref 98–110)
Creat: 0.72 mg/dL (ref 0.60–0.95)
Globulin: 2.4 g/dL (ref 1.9–3.7)
Glucose, Bld: 128 mg/dL (ref 65–139)
Potassium: 3.9 mmol/L (ref 3.5–5.3)
Sodium: 144 mmol/L (ref 135–146)
Total Bilirubin: 0.7 mg/dL (ref 0.2–1.2)
Total Protein: 6.2 g/dL (ref 6.1–8.1)
eGFR: 81 mL/min/{1.73_m2} (ref >=60)

## 2023-01-05 LAB — LIPID PANEL
Cholesterol: 123 mg/dL (ref <200)
HDL: 52 mg/dL (ref >=50)
LDL Cholesterol (Calc): 51 mg/dL
Non-HDL Cholesterol (Calc): 71 mg/dL (ref <130)
Total CHOL/HDL Ratio: 2.4 (calc) (ref <5.0)
Triglycerides: 122 mg/dL (ref <150)

## 2023-01-05 LAB — HEMOGLOBIN A1C
Hgb A1c MFr Bld: 5.8 %{Hb} — ABNORMAL HIGH (ref <5.7)
Mean Plasma Glucose: 120 mg/dL
eAG (mmol/L): 6.6 mmol/L

## 2023-01-30 ENCOUNTER — Other Ambulatory Visit: Payer: Self-pay | Admitting: Nurse Practitioner

## 2023-01-30 DIAGNOSIS — I10 Essential (primary) hypertension: Secondary | ICD-10-CM

## 2023-02-03 ENCOUNTER — Other Ambulatory Visit: Payer: Self-pay | Admitting: Nurse Practitioner

## 2023-02-03 DIAGNOSIS — E782 Mixed hyperlipidemia: Secondary | ICD-10-CM

## 2023-05-17 ENCOUNTER — Other Ambulatory Visit: Payer: Self-pay | Admitting: Nurse Practitioner

## 2023-05-17 DIAGNOSIS — N390 Urinary tract infection, site not specified: Secondary | ICD-10-CM

## 2023-07-05 ENCOUNTER — Ambulatory Visit (INDEPENDENT_AMBULATORY_CARE_PROVIDER_SITE_OTHER): Payer: Medicare Other | Admitting: Nurse Practitioner

## 2023-07-05 ENCOUNTER — Encounter: Payer: Self-pay | Admitting: Nurse Practitioner

## 2023-07-05 VITALS — BP 124/80 | HR 60 | Temp 97.4°F | Ht 62.0 in | Wt 180.0 lb

## 2023-07-05 DIAGNOSIS — I10 Essential (primary) hypertension: Secondary | ICD-10-CM

## 2023-07-05 DIAGNOSIS — I872 Venous insufficiency (chronic) (peripheral): Secondary | ICD-10-CM

## 2023-07-05 DIAGNOSIS — M150 Primary generalized (osteo)arthritis: Secondary | ICD-10-CM

## 2023-07-05 DIAGNOSIS — N390 Urinary tract infection, site not specified: Secondary | ICD-10-CM

## 2023-07-05 DIAGNOSIS — N3946 Mixed incontinence: Secondary | ICD-10-CM

## 2023-07-05 DIAGNOSIS — R5383 Other fatigue: Secondary | ICD-10-CM

## 2023-07-05 DIAGNOSIS — E782 Mixed hyperlipidemia: Secondary | ICD-10-CM | POA: Diagnosis not present

## 2023-07-05 DIAGNOSIS — R739 Hyperglycemia, unspecified: Secondary | ICD-10-CM

## 2023-07-05 NOTE — Progress Notes (Signed)
 Careteam: Patient Care Team: Sharon Seller, NP as PCP - General (Geriatric Medicine) Campbell Stall, MD as Attending Physician (Dermatology) Glenna Fellows, MD as Consulting Physician (Plastic Surgery) Wilburn Mylar, MD as Referring Physician (Urology) Early, Kristen Loader, MD (Inactive) as Consulting Physician (Vascular Surgery) Ollen Gross, MD as Consulting Physician (Orthopedic Surgery) Mateo Flow, MD as Consulting Physician (Ophthalmology)  PLACE OF SERVICE:  [facility]  Advanced Directive information    Allergies  Allergen Reactions   Azithromycin Other (See Comments)    thrush   Latex Hives and Other (See Comments)    Lip swelling and redness    Codeine Itching   Doxycycline     Unsure of reaction    Nsaids     Stomach ulcers    Tape Rash    Latex Bandaid     Chief Complaint  Patient presents with   Medical Management of Chronic Issues    6 month follow-up. Patient c/o fatigue, increased tiredness, and overs " I feel used up." Discussed need for covid, shingrix, and td/tdap (per patient I'll think about getting recommended vaccines)     HPI: pt is a 89 y.o. female for routine follow up.  Reports she has ongoing fatigue and feeling worn out.  Will fall asleep in the middle of doing something.  Reports she has had 4 children and worked in the [employer]. She had to do a lot of manual labor.   OA- knees are bad, pain is tolerable. Uses Voltaren gel Uses tylenol for back mostly.   Blood pressure controlled on nadolol   No recent UTI- does well on keflex  Has urinary incontinence 24/7.   Hx of kidney stones- no pain in back or blood in urine No symptoms of this-reports she has very distinct symptoms  Review of Systems:  Review of Systems  Constitutional:  Positive for malaise/fatigue. Negative for chills, fever and weight loss.  HENT:  Negative for tinnitus.   Respiratory:  Negative for cough, sputum production and shortness of  breath.   Cardiovascular:  Negative for chest pain, palpitations and leg swelling.  Gastrointestinal:  Negative for abdominal pain, constipation, diarrhea and heartburn.  Genitourinary:  Negative for dysuria, frequency and urgency.  Musculoskeletal:  Negative for back pain, falls, joint pain and myalgias.  Skin: Negative.   Neurological:  Negative for dizziness and headaches.  Psychiatric/Behavioral:  Negative for depression and memory loss. The patient does not have insomnia.    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 Wilburn Mylar   Past Surgical History:  Procedure Laterality Date   ABLATION  2005  endovenous   APPENDECTOMY     AUGMENTATION MAMMAPLASTY Bilateral 1971   [facility] Dr. Wilmon Hashimoto   CHOLECYSTECTOMY  1986   chronic diarrhea since   COSMETIC SURGERY     LITHOTRIPSY Bilateral 2006   PERCUTANEOUS NEPHROSTOLITHOTOMY Left 06/30/2011   w/ cystoscopy by Dr. Lorelie Rohrer at [facility] (and poss ureteral stent placement?)   PERCUTANEOUS NEPHROSTOLITHOTOMY Left 07/07/2011   w/ ureteroscopy and stone manipulation Dr. Lorelie Rohrer at [facility]. Surg >4 hrs with gen anesthesia and pt noticed new permanent cognitive deficits after    TOTAL HIP ARTHROPLASTY Right 12/17/2020   Procedure: TOTAL HIP ARTHROPLASTY ANTERIOR APPROACH;  Surgeon: Claiborne Crew, MD;  Location: [facility];  Service: Orthopedics;  Laterality: Right;   TUBAL LIGATION Bilateral 1968   URETEROLITHOTOMY Right 12/17/2011   Dr. Lorelie Roh[facility] at [facility]   URETEROSCOPY WITH HOLMIUM LASER LITHOTRIPSY Left 07/07/2011   by percutaneous nephrostolithotomy with basket extraction, ureteral stent removal (placed 06/30/11?), and placement of Lt percutaneous nephrostomy tube by Dr. Lorelie Rohrer at [facility] for numerous >2cm staghorn calcium oxalate stones in left renal pelvis retained after initial perc nephrostolithotomy wiht stent placement 1 wk prior    Social History:   reports that she has never smoked. She has never used smokeless tobacco. She reports that she does not drink alcohol and does not use drugs.  Family History  Problem Relation Age of Onset   Stroke Father    Hyperlipidemia Father    Lung cancer Mother    Cancer Maternal Grandmother     Medications: Patient's Medications  New Prescriptions   No medications on file  Previous Medications   ACETAMINOPHEN (TYLENOL) 500 MG TABLET    Take 500 mg by mouth as needed.   CEPHALEXIN (KEFLEX) 500 MG CAPSULE    TAKE 1 CAPSULE(500 MG) BY MOUTH DAILY FOR UTI PREVENTION   LINIMENTS (SALONPAS ARTHRITIS PAIN RELIEF EX)    Apply 1 patch topically daily as needed (pain).   NADOLOL (CORGARD) 40 MG TABLET    TAKE 1 TABLET(40 MG) BY MOUTH DAILY   NYSTATIN-TRIAMCINOLONE OINTMENT (MYCOLOG)    Apply 1 Application topically as needed.   ROSUVASTATIN (CRESTOR) 5 MG TABLET    TAKE 1 TABLET(5 MG) BY MOUTH DAILY  Modified Medications   No medications on file  Discontinued Medications   No medications on file    Physical Exam:  Vitals:   07/05/23 1510  BP: 124/80  Pulse: 60  Temp: (!) 97.4 F (36.3 C)  TempSrc: Temporal  SpO2: 98%  Weight: 180 lb (81.6 kg)  Height: 5\' 2"  (1.575 m)   Body mass index is 32.92  kg/m. Wt Readings from Last 3 Encounters:  07/05/23 180 lb (81.6 kg)  01/04/23 184 lb 12.8 oz (83.8 kg)  10/26/22 189 lb (85.7 kg)    Physical Exam Constitutional:      General: She is not in acute distress.    Appearance: She is well-developed. She is not diaphoretic.  HENT:     Head: Normocephalic and atraumatic.     Mouth/Throat:     Pharynx: No oropharyngeal exudate.  Eyes:     Conjunctiva/sclera: Conjunctivae normal.     Pupils: Pupils are equal, round, and reactive to light.  Cardiovascular:     Rate and Rhythm: Normal rate and regular rhythm.     Heart sounds: Normal heart sounds.  Pulmonary:     Effort: Pulmonary effort is normal.     Breath sounds: Normal breath sounds.  Abdominal:     General: Bowel sounds are  normal.     Palpations: Abdomen is soft.  Musculoskeletal:     Cervical back: Normal range of motion and neck supple.     Right lower leg: No edema.     Left lower leg: No edema.  Skin:    General: Skin is warm and dry.     Comments: Bilateral feet with chronic blue/purple discoloration   Neurological:     Mental Status: She is alert.  Psychiatric:        Mood and Affect: Mood normal.     Labs reviewed: Basic Metabolic Panel: Recent Labs    01/04/23 1602  NA 144  K 3.9  CL 107  CO2 27  GLUCOSE 128  BUN 19  CREATININE 0.72  CALCIUM 8.9   Liver Function Tests: Recent Labs    01/04/23 1602  AST 17  ALT 11  BILITOT 0.7  PROT 6.2   No results for input(s): "LIPASE", "AMYLASE" in the last 8760 hours. No results for input(s): "AMMONIA" in the last 8760 hours. CBC: Recent Labs    01/04/23 1602  WBC 5.0  NEUTROABS 2,760  HGB 13.7  HCT 42.8  MCV 86.5  PLT 158   Lipid Panel: Recent Labs    01/04/23 1602  CHOL 123  HDL 52  LDLCALC 51  TRIG 122  CHOLHDL 2.4   TSH: No results for input(s): "TSH" in the last 8760 hours. A1C: Lab Results  Component Value Date   HGBA1C 5.8 (H) 01/04/2023     Assessment/Plan  1. Primary  hypertension (Primary) -Blood pressure well controlled, goal bp <140/90 Continue current medications and dietary modifications follow metabolic panel - COMPLETE METABOLIC PANEL WITHOUT GFR - CBC with Differential/Platelet  2. Recurrent UTI Stable, no symptoms of UTI, continues on keflex daily  3. Primary osteoarthritis involving multiple joints -ongoing and stable, continues on tylenol PRN, muscle rub and exercises  4. Hyperlipidemia, mixed Continues on crestor with dietary modifications  5. Venous insufficiency of both lower extremities -stable, no changes in symptoms  6. Hyperglycemia Continue dietary modifications - Hemoglobin A1c  7. Mixed stress and urge urinary incontinence -ongoing. Continues to use pads with lifestyle modifications   8. Other fatigue Ongoing, encouraged proper nutrition and hydration Will follow up labs toda - COMPLETE METABOLIC PANEL WITHOUT GFR - CBC with Differential/Platelet - Hemoglobin A1c - TSH   Return in about 6 months (around 01/04/2024) for routine follow up.  Naheim Burgen K. Denney Fisherman [facility] [phone]

## 2023-07-05 NOTE — Patient Instructions (Addendum)
 Urology - Kleinguetl, Jarrett Merry, MD [address]  [place], [place] [address]  [phone]   Increase protein in diet.  Protein at every meal Making sure you are staying hydrated

## 2023-07-06 ENCOUNTER — Other Ambulatory Visit: Payer: Self-pay | Admitting: Nurse Practitioner

## 2023-07-06 ENCOUNTER — Telehealth: Payer: Self-pay

## 2023-07-06 ENCOUNTER — Encounter: Payer: Self-pay | Admitting: Nurse Practitioner

## 2023-07-06 DIAGNOSIS — E782 Mixed hyperlipidemia: Secondary | ICD-10-CM

## 2023-07-06 LAB — COMPLETE METABOLIC PANEL WITHOUT GFR
AG Ratio: 1.7 (calc) (ref 1.0–2.5)
ALT: 10 U/L (ref 6–29)
AST: 18 U/L (ref 10–35)
Albumin: 4 g/dL (ref 3.6–5.1)
Alkaline phosphatase (APISO): 51 U/L (ref 37–153)
BUN: 20 mg/dL (ref 7–25)
CO2: 28 mmol/L (ref 20–32)
Calcium: 9.3 mg/dL (ref 8.6–10.4)
Chloride: 107 mmol/L (ref 98–110)
Creat: 0.75 mg/dL (ref 0.60–0.95)
Globulin: 2.4 g/dL (ref 1.9–3.7)
Glucose, Bld: 96 mg/dL (ref 65–139)
Potassium: 4.1 mmol/L (ref 3.5–5.3)
Sodium: 141 mmol/L (ref 135–146)
Total Bilirubin: 0.8 mg/dL (ref 0.2–1.2)
Total Protein: 6.4 g/dL (ref 6.1–8.1)

## 2023-07-06 LAB — CBC WITH DIFFERENTIAL/PLATELET
Absolute Lymphocytes: 2208 {cells}/uL (ref 850–3900)
Absolute Monocytes: 582 {cells}/uL (ref 200–950)
Basophils Absolute: 18 {cells}/uL (ref 0–200)
Basophils Relative: 0.3 %
Eosinophils Absolute: 108 {cells}/uL (ref 15–500)
Eosinophils Relative: 1.8 %
HCT: 42.6 % (ref 35.0–45.0)
Hemoglobin: 13.6 g/dL (ref 11.7–15.5)
MCH: 27.8 pg (ref 27.0–33.0)
MCHC: 31.9 g/dL — ABNORMAL LOW (ref 32.0–36.0)
MCV: 86.9 fL (ref 80.0–100.0)
MPV: 10.8 fL (ref 7.5–12.5)
Monocytes Relative: 9.7 %
Neutro Abs: 3084 {cells}/uL (ref 1500–7800)
Neutrophils Relative %: 51.4 %
Platelets: 171 10*3/uL (ref 140–400)
RBC: 4.9 10*6/uL (ref 3.80–5.10)
RDW: 14.4 % (ref 11.0–15.0)
Total Lymphocyte: 36.8 %
WBC: 6 10*3/uL (ref 3.8–10.8)

## 2023-07-06 LAB — HEMOGLOBIN A1C
Hgb A1c MFr Bld: 5.8 % — ABNORMAL HIGH (ref <5.7)
Mean Plasma Glucose: 120 mg/dL
eAG (mmol/L): 6.6 mmol/L

## 2023-07-06 LAB — TSH: TSH: 1.55 m[IU]/L (ref 0.40–4.50)

## 2023-07-06 NOTE — Telephone Encounter (Signed)
 Noted.

## 2023-07-06 NOTE — Telephone Encounter (Signed)
 Copied from CRM [id number]. Topic: Clinical - Lab/Test Results >> Jul 06, 2023  2:45 PM Tiffany H wrote: Reason for CRM: Patient called to review lab results. Relayed findings via labs tab. Patient voiced understanding.

## 2023-07-07 ENCOUNTER — Other Ambulatory Visit: Payer: Self-pay | Admitting: Nurse Practitioner

## 2023-07-07 DIAGNOSIS — I10 Essential (primary) hypertension: Secondary | ICD-10-CM

## 2023-10-20 ENCOUNTER — Ambulatory Visit
Admission: RE | Admit: 2023-10-20 | Discharge: 2023-10-20 | Disposition: A | Discharge status: Home / Self Care | Source: Ambulatory Visit | Attending: Family Medicine | Admitting: Family Medicine

## 2023-10-20 ENCOUNTER — Other Ambulatory Visit: Payer: Self-pay

## 2023-10-20 ENCOUNTER — Ambulatory Visit: Payer: Self-pay

## 2023-10-20 VITALS — BP 176/84 | HR 47 | Temp 97.7°F | Resp 16

## 2023-10-20 DIAGNOSIS — N3001 Acute cystitis with hematuria: Secondary | ICD-10-CM | POA: Insufficient documentation

## 2023-10-20 LAB — POCT URINE DIPSTICK
Bilirubin, UA: NEGATIVE
Glucose, UA: NEGATIVE mg/dL
Ketones, POC UA: NEGATIVE mg/dL
Nitrite, UA: POSITIVE — AB
POC PROTEIN,UA: 100 — AB
Spec Grav, UA: 1.02 (ref 1.010–1.025)
Urobilinogen, UA: 0.2 U/dL
pH, UA: 6 (ref 5.0–8.0)

## 2023-10-20 MED ORDER — CEFDINIR 300 MG PO CAPS: 300.0000 mg | ORAL_CAPSULE | Freq: Two times a day (BID) | ORAL | 0 refills | Status: DC

## 2023-10-20 NOTE — ED Provider Notes (Signed)
 [facility]    CSN: [id number] Arrival date & time: 10/20/23  1738      History   Chief Complaint Chief Complaint  Patient presents with   Dysuria    HPI Susan Boone is a 89 y.o. female with a past medical history as listed below presents for dysuria.  Patient reports 3 days of urinary burning without urgency or frequency.  She does endorse urinary incontinence and wears depends.  No fevers, nausea/vomiting, flank pain.  She does take 500 mg of Keflex  daily for prophylaxis but due to some GI issues she stopped taking this over the past 4 to 5 days.  She has not taken any OTC treatments for symptoms since onset.  No other concerns at this time.    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 Almarie Morrow    Patient Active Problem List   Diagnosis Date Noted   S/P right total hip arthroplasty 12/17/2020   S/P total right hip arthroplasty 12/17/2020   Hyperlipidemia, mixed 04/18/2020   Dry mouth 04/18/2020   Hyperglycemia 12/11/2019   Right kidney mass 07/16/2019   Ruptured right breast implant, sequela 07/16/2019   Ruptured left breast implant, sequela 07/16/2019   Neuralgia, postherpetic  07/16/2019   Vitamin D  deficiency 05/05/2017   Vitamin B12 deficiency 05/05/2017   Recurrent UTI 07/07/2013   Calculus of kidney 06/23/2011   Environmental allergies 06/23/2011   Lower leg edema 06/23/2011   Migraine 06/23/2011   UMBILICAL HERNIA 10/20/2007   Irritable bowel syndrome with diarrhea 10/20/2007   RENAL CYST, LEFT 10/20/2007   ARTHRITIS 10/20/2007   DYSPHAGIA UNSPECIFIED 10/20/2007   DYSPHAGIA 10/20/2007   DIARRHEA 10/20/2007   ANEMIA, HX OF 10/20/2007   NEPHROLITHIASIS, HX OF 10/20/2007    Past Surgical History:  Procedure Laterality Date   ABLATION  2005   endovenous   APPENDECTOMY     AUGMENTATION MAMMAPLASTY Bilateral 1971   [facility] Dr. Santo   CHOLECYSTECTOMY  1986   chronic diarrhea since   COSMETIC SURGERY     LITHOTRIPSY Bilateral 2006   PERCUTANEOUS NEPHROSTOLITHOTOMY Left 06/30/2011   w/ cystoscopy by Dr. Fayette at [facility] (and poss ureteral stent placement?)   PERCUTANEOUS NEPHROSTOLITHOTOMY Left 07/07/2011   w/ ureteroscopy and stone manipulation Dr. Fayette at [facility]. Surg >4 hrs with gen anesthesia and pt noticed new permanent cognitive deficits after   TOTAL HIP ARTHROPLASTY Right 12/17/2020   Procedure: TOTAL HIP ARTHROPLASTY ANTERIOR APPROACH;  Surgeon: Ernie Cough, MD;  Location: [facility];  Service: Orthopedics;  Laterality: Right;   TUBAL LIGATION Bilateral 1968   URETEROLITHOTOMY Right  12/17/2011   Dr. Fayette at [facility]   URETEROSCOPY WITH HOLMIUM LASER LITHOTRIPSY Left 07/07/2011   by percutaneous nephrostolithotomy with basket extraction, ureteral stent removal (placed 06/30/11?), and placement of Lt percutaneous nephrostomy tube by Dr. Fayette at [facility] for numerous >2cm staghorn calcium  oxalate stones in left renal pelvis retained after initial perc nephrostolithotomy wiht stent placement 1 wk prior     OB History   No obstetric history on file.      Home Medications    Prior to Admission medications    Medication Sig Start Date End Date Taking? Authorizing Provider  cefdinir  (OMNICEF ) 300 MG capsule Take 1 capsule (300 mg total) by mouth 2 (two) times daily. 10/20/23  Yes Sheddrick Lattanzio, Jodi R, NP  acetaminophen  (TYLENOL ) 500 MG tablet Take 500 mg by mouth as needed.    [provider]  cephALEXin  (KEFLEX ) 500 MG capsule TAKE 1 CAPSULE(500 MG) BY MOUTH DAILY FOR UTI PREVENTION 05/17/23   Eubanks, Jessica K, NP  [facility] ARTHRITIS PAIN RELIEF EX) Apply 1 patch topically daily as needed (pain).    [provider]  nadolol  (CORGARD ) 40 MG tablet TAKE 1 TABLET(40 MG) BY MOUTH DAILY 07/07/23   Eubanks, Jessica K, NP  nystatin -triamcinolone  ointment (MYCOLOG) Apply 1 Application topically as needed.    [provider]  rosuvastatin  (CRESTOR ) 5 MG tablet TAKE 1 TABLET(5 MG) BY MOUTH DAILY 07/06/23   Caro Harlene POUR, NP    Family History Family History  Problem Relation Age of Onset   Stroke Father    Hyperlipidemia Father    Lung cancer Mother    Cancer Maternal Grandmother     Social History Social History   Tobacco Use   Smoking status: Never   Smokeless tobacco: Never   Tobacco comments:    Smoked a little bit, never a habit to break (no struggle)   Vaping Use   Vaping status: Never Used  Substance Use Topics   Alcohol use: No    Alcohol/week: 0.0 standard drinks of alcohol   Drug use: No     Allergies   Azithromycin, Latex, Codeine, Doxycycline, Nsaids, and Tape   Review of Systems Review of Systems  Genitourinary:  Positive for dysuria.     Physical Exam Triage Vital Signs ED Triage Vitals  Encounter Vitals Group     BP 10/20/23 1755 (!) 176/84     Girls Systolic BP Percentile --      Girls Diastolic BP Percentile --      Boys Systolic BP Percentile --      Boys Diastolic BP Percentile --      Pulse Rate 10/20/23 1755 (!) 47     Resp 10/20/23 1755 16     Temp 10/20/23 1755 97.7 F (36.5 C)     Temp Source 10/20/23 1755 Oral      SpO2 10/20/23 1755 96 %     Weight --      Height --      Head Circumference --      Peak Flow --      Pain Score 10/20/23 1752 0     Pain Loc --      Pain Education --      Exclude from Growth Chart --    No data found.  Updated Vital Signs BP (!) 176/84   Pulse (!) 47   Temp 97.7 F (36.5 C) (Oral)   Resp 16   SpO2 96%   Visual Acuity Right Eye Distance:  Left Eye Distance:   Bilateral Distance:    Right Eye Near:   Left Eye Near:    Bilateral Near:     Physical Exam Vitals and nursing note reviewed.  Constitutional:      Appearance: Normal appearance.  HENT:     Head: Normocephalic and atraumatic.  Eyes:     Pupils: Pupils are equal, round, and reactive to light.  Cardiovascular:     Rate and Rhythm: Normal rate.  Pulmonary:     Effort: Pulmonary effort is normal.  Abdominal:     Tenderness: There is no right CVA tenderness or left CVA tenderness.  Skin:    General: Skin is warm and dry.  Neurological:     General: No focal deficit present.     Mental Status: She is alert and oriented to person, place, and time.  Psychiatric:        Mood and Affect: Mood normal.        Behavior: Behavior normal.      UC Treatments / Results  Labs (all labs ordered are listed, but only abnormal results are displayed) Labs Reviewed  POCT URINE DIPSTICK - Abnormal; Notable for the following components:      Result Value   Clarity, UA cloudy (*)    Blood, UA moderate (*)    POC PROTEIN,UA =100 (*)    Nitrite, UA Positive (*)    Leukocytes, UA Large (3+) (*)    All other components within normal limits  URINE CULTURE   COMPLETE METABOLIC PANEL WITHOUT GFR Order: [id number]  Status: Final result     Next appt: 11/01/2023 at 03:00 PM in Internal Medicine Young MARLA An, NP)     Dx: Primary hypertension; Other fatigue   Test Result Released: Yes (seen)     Messages: Seen   2 Result Notes     1 Patient Communication     View Follow-Up Encounter           Component Ref Range & Units (hover) 3 mo ago (07/05/23) 9 mo ago (01/04/23) 1 yr ago (05/04/22) 1 yr ago (12/16/21) 1 yr ago (12/16/21) 1 yr ago (12/16/21) 2 yr ago (08/25/21)  Glucose, Bld 96 128 CM 110 High  R, CM   92 R 124 CM  Comment: .        Non-fasting reference interval .  BUN 20 19 16   14  R 12  Creat 0.75 0.72 0.69   0.7 R 0.76  BUN/Creatinine Ratio SEE NOTE: SEE NOTE: CM SEE NOTE: CM    NOT APPLICABLE  Comment:    Not Reported: BUN and Creatinine are within    reference range. .  Sodium 141 144 143   140 R 141  Potassium 4.1 3.9 4.8   4.1 R 4.1  Chloride 107 107 107   28 Abnormal  R 107  CO2 28 27 27   28  Abnormal  R 27  Calcium  9.3 8.9 9.0  8.7 R  9.2  Total Protein 6.4 6.2 6.4    6.1  Albumin 4.0 3.8 3.8    3.8  Globulin 2.4 2.4 2.6    2.3  AG Ratio 1.7 1.6 1.5    1.7  Total Bilirubin 0.8 0.7 0.7    0.7  Alkaline phosphatase (APISO) 51 51 51    49  AST 18 17 17 18  R   18  ALT 10 11 9 9  R   9  Resulting Agency [facility] [place] [facility]  [place] [facility] [place] Abstrct Entry Abstrct Entry Abstrct Entry [facility] [place]     EKG   Radiology No results found.  Procedures Procedures (including critical care time)  Medications Ordered in UC Medications - No data to display  Initial Impression / Assessment and Plan / UC Course  I have reviewed the triage vital signs and the nursing notes.  Pertinent labs & imaging results that were available during my care of the patient were reviewed by me and considered in my medical decision making (see chart for details).     Reviewed exam and symptoms with patient.  No red flags.  Urine positive for UTI will culture.  Will start cefdinir  twice daily for 7 days and patient was instructed not to restart her Keflex  prophylactic dose while taking this medication.  Advise rest fluids and PCP follow-up if symptoms do not improve.  ER precautions reviewed. Final Clinical  Impressions(s) / UC Diagnoses   Final diagnoses:  Acute cystitis with hematuria     Discharge Instructions      The clinic will contact you with results of the urine culture done today if positive.  Start cefdinir  twice daily for 7 days.  Do not take your prophylactic Keflex  while you are taking cefdinir .  Lots of rest and fluids.  Please follow-up with your PCP if your symptoms do not improve.  Please go to the ER for any worsening symptoms.  Hope you feel better soon!     ED Prescriptions     Medication Sig Dispense Auth. Provider   cefdinir  (OMNICEF ) 300 MG capsule Take 1 capsule (300 mg total) by mouth 2 (two) times daily. 14 capsule Vyla Pint, Jodi R, NP      PDMP not reviewed this encounter.   Loreda Myla SAUNDERS, NP 10/20/23 [phone]

## 2023-10-20 NOTE — Telephone Encounter (Signed)
 FYI Only or Action Required?: FYI only for provider.  Patient was last seen in primary care on 07/05/2023 by Caro Harlene POUR, NP.  Called Nurse Triage reporting Dysuria.  Symptoms began several days ago.  Interventions attempted: Rest, hydration, or home remedies.  Symptoms are: unchanged.  Triage Disposition: See HCP Within 4 Hours (Or PCP Triage)-patient scheduled for Urgent Care visit to be seen tonight. Information given to patient.   Patient/caregiver understands and will follow disposition?: Yes  Copied from CRM [id number]. Topic: Clinical - Red Word Triage >> Oct 20, 2023  4:06 PM Merlynn A wrote: Red Word that prompted transfer to Nurse Triage: Possible UTI, pain overnight when urinating Reason for Disposition  [1] SEVERE pain with urination (e.g., excruciating) AND [2] not improved after 2 hours of pain medicine  Answer Assessment - Initial Assessment Questions 1. SEVERITY: How bad is the pain?  (e.g., Scale 1-10; mild, moderate, or severe)     Patient states pain is different every time. Patient endorses pain can be anywhere from mild to severe.  2. FREQUENCY: How many times have you had painful urination today?      Multiple times 3. PATTERN: Is pain present every time you urinate or just sometimes?      Pain is with every time she urinates 4. ONSET: When did the painful urination start?      Started a few days ago 5. FEVER: Do you have a fever? If Yes, ask: What is your temperature, how was it measured, and when did it start?     no 6. PAST UTI: Have you had a urine infection before? If Yes, ask: When was the last time? and What happened that time?      yes 7. CAUSE: What do you think is causing the painful urination?  (e.g., UTI, scratch, Herpes sore)     Possible UTI 8. OTHER SYMPTOMS: Do you have any other symptoms? (e.g., blood in urine, flank pain, genital sores, urgency, vaginal discharge)     No other symptoms.  Patient reports that she  had to stop Keflex  for four to five days. Patient believes her symptoms could be connected from not having taken Keflex  for a period of time  Protocols used: Urination Pain - [facility]

## 2023-10-20 NOTE — Telephone Encounter (Signed)
 See Triage Notes from Triage Nurse.  RICK Caro Harlene MARLA, NP has been Notified  Message sent to Caro Harlene MARLA, NP

## 2023-10-20 NOTE — Discharge Instructions (Addendum)
 The clinic will contact you with results of the urine culture done today if positive.  Start cefdinir  twice daily for 7 days.  Do not take your prophylactic Keflex  while you are taking cefdinir .  Lots of rest and fluids.  Please follow-up with your PCP if your symptoms do not improve.  Please go to the ER for any worsening symptoms.  Hope you feel better soon!

## 2023-10-20 NOTE — ED Triage Notes (Signed)
 Pt c/o dysuriax3d.

## 2023-10-25 ENCOUNTER — Ambulatory Visit (HOSPITAL_COMMUNITY): Payer: Self-pay

## 2023-10-25 LAB — URINE CULTURE: Culture: >=100000 — AB

## 2023-10-25 MED ORDER — NITROFURANTOIN MONOHYD MACRO 100 MG PO CAPS: 100.0000 mg | ORAL_CAPSULE | Freq: Two times a day (BID) | ORAL | 0 refills | Status: AC

## 2023-10-25 MED ORDER — PHENAZOPYRIDINE HCL 100 MG PO TABS: 100.0000 mg | ORAL_TABLET | Freq: Three times a day (TID) | ORAL | 0 refills | Status: DC | PRN

## 2023-10-25 NOTE — Telephone Encounter (Signed)
 During call to pt to discuss Macrobid  being added to treat UTI, pt asked if this was in capsule form. Confirmed that it is.  Pt then went on to say she is having trouble swallowing and asked if she could open the capsule and take medication in pudding. Informed pt that she can, but asked if swallowing issue was new. Pt states she has had issues previously, but that it has been worse lately. Reports concerns swallowing when she is alone in the house.   Pt has PCP appt one week from today. Advised pt to please speak with PCP regarding this. Pt asked that I also send message to PCP, which I had already planned to do.

## 2023-11-01 ENCOUNTER — Telehealth: Payer: Medicare Other | Admitting: Nurse Practitioner

## 2023-11-01 ENCOUNTER — Encounter: Payer: Self-pay | Admitting: Nurse Practitioner

## 2023-11-01 VITALS — BP 128/82 | HR 54 | Temp 97.4°F | Resp 9 | Ht 62.0 in

## 2023-11-01 DIAGNOSIS — R300 Dysuria: Secondary | ICD-10-CM | POA: Diagnosis not present

## 2023-11-01 DIAGNOSIS — N390 Urinary tract infection, site not specified: Secondary | ICD-10-CM

## 2023-11-01 LAB — POCT URINALYSIS DIPSTICK
Bilirubin, UA: NEGATIVE
Glucose, UA: NEGATIVE
Nitrite, UA: NEGATIVE
Protein, UA: POSITIVE — AB
Spec Grav, UA: 1.02 (ref 1.010–1.025)
Urobilinogen, UA: 0.2 U/dL
pH, UA: 5 (ref 5.0–8.0)

## 2023-11-01 MED ORDER — PHENAZOPYRIDINE HCL 100 MG PO TABS: 100.0000 mg | ORAL_TABLET | Freq: Three times a day (TID) | ORAL | 0 refills | Status: DC | PRN

## 2023-11-01 NOTE — Patient Instructions (Signed)
 Awaiting culture for antibiotic   Can use pyridium  if pain gets worse but only use for MAX of 3 days   Increase water  intake.   Referral to urinologist has been placed

## 2023-11-01 NOTE — Progress Notes (Signed)
 Careteam: Patient Care Team: Caro Harlene POUR, NP as PCP - General (Geriatric Medicine) Helga Slice, MD as Attending Physician (Dermatology) Arelia Filippo, MD as Consulting Physician (Plastic Surgery) Rozell Norris, MD as Referring Physician (Urology) Early, Krystal FALCON, MD (Inactive) as Consulting Physician (Vascular Surgery) Melodi Lerner, MD as Consulting Physician (Orthopedic Surgery) Cleatus Collar, MD as Consulting Physician (Ophthalmology)  PLACE OF SERVICE:  [facility]  Advanced Directive information    Allergies  Allergen Reactions   Azithromycin Other (See Comments)    thrush   Latex Hives and Other (See Comments)    Lip swelling and redness    Codeine Itching   Doxycycline     Unsure of reaction    Nsaids     Stomach ulcers    Tape Rash    Latex Bandaid     Chief Complaint  Patient presents with   Acute Visit    Uti     HPI:  Discussed the use of AI scribe software for clinical note transcription with the patient, who gave verbal consent to proceed.  History of Present Illness Susan Boone is an 89 year old female with recurrent urinary tract infections and kidney stones who presents with ongoing urinary symptoms.  She experiences ongoing urinary symptoms, including dysuria, frequency, and urgency. These symptoms have persisted despite completing courses of cefdinir  and nitrofurantoin  for a urinary tract infection diagnosed at the end of July. The symptoms are less intense than before but still present, with some urinations being more painful than others.  She has a history of recurrent urinary tract infections and kidney stones, including a past total blockage in her left kidney due to a Staghorn stone, which required two surgeries. She has been on a preventative dose of cephalexin  daily but missed doses due to illness and is not currently taking it.  No fever, but she reports xerostomia and xerophthalmia, with 'gummy stuff' in her  eyes, which she associates with a possible diagnosis of Sjogren's syndrome. She experiences arthralgia and has a dry, burning sensation in her mouth when eating, which she attributes to dryness.  She is trying to stay hydrated but is not getting up frequently to urinate, instead using a wet pad. She has not been on phenazopyridine  recently but wants to have it on hand for severe discomfort.  She has difficulty using MyChart for communication and prefers phone calls for updates.   Review of Systems:  Review of Systems  Constitutional:  Negative for chills, fever and weight loss.  HENT:  Negative for tinnitus.   Respiratory:  Negative for cough, sputum production and shortness of breath.   Cardiovascular:  Negative for chest pain, palpitations and leg swelling.  Gastrointestinal:  Negative for abdominal pain, constipation, diarrhea and heartburn.  Genitourinary:  Positive for dysuria, frequency and urgency.  Musculoskeletal:  Negative for back pain, falls, joint pain and myalgias.  Skin: Negative.   Neurological:  Negative for dizziness and headaches.  Psychiatric/Behavioral:  Negative for depression and memory loss. The patient does not have insomnia.     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 Almarie Morrow   Past Surgical History:  Procedure Laterality Date   ABLATION  2005   endovenous   APPENDECTOMY     AUGMENTATION MAMMAPLASTY Bilateral 1971   [facility] Dr. Santo   CHOLECYSTECTOMY  1986   chronic diarrhea since   COSMETIC SURGERY     LITHOTRIPSY Bilateral 2006   PERCUTANEOUS NEPHROSTOLITHOTOMY Left 06/30/2011   w/ cystoscopy by Dr. Fayette at [facility] (and poss ureteral stent placement?)   PERCUTANEOUS NEPHROSTOLITHOTOMY Left 07/07/2011   w/ ureteroscopy and stone manipulation Dr. Fayette at [facility]. Surg >4 hrs with gen anesthesia and pt noticed new permanent cognitive deficits after   TOTAL HIP ARTHROPLASTY Right 12/17/2020   Procedure: TOTAL HIP ARTHROPLASTY ANTERIOR APPROACH;  Surgeon: Ernie Cough, MD;  Location: [facility];  Service: Orthopedics;  Laterality: Right;   TUBAL LIGATION Bilateral 1968   URETEROLITHOTOMY Right 12/17/2011   Dr. Fayette at [facility]   URETEROSCOPY WITH HOLMIUM LASER LITHOTRIPSY Left 07/07/2011   by percutaneous nephrostolithotomy with basket extraction, ureteral stent removal (placed 06/30/11?), and placement of Lt percutaneous nephrostomy tube by Dr. Fayette at [facility] for numerous >2cm staghorn calcium  oxalate stones in left renal pelvis retained after initial perc nephrostolithotomy wiht stent placement 1 wk prior    Social History:   reports that she has never smoked. She has never used smokeless tobacco. She reports that she does not drink alcohol and does not use drugs.  Family History  Problem Relation Age of Onset   Stroke Father    Hyperlipidemia Father    Lung cancer Mother    Cancer Maternal Grandmother     Medications: Patient's Medications  New Prescriptions   No medications on file  Previous Medications   ACETAMINOPHEN  (TYLENOL ) 500 MG TABLET    Take 500 mg by mouth as needed.   CEPHALEXIN  (KEFLEX ) 500 MG CAPSULE    TAKE 1 CAPSULE(500  MG) BY MOUTH DAILY FOR UTI PREVENTION   LINIMENTS (S[place]PAS ARTHRITIS PAIN RELIEF EX)    Apply 1 patch topically daily as needed (pain).   NADOLOL  (CORGARD ) 40 MG TABLET    TAKE 1 TABLET(40 MG) BY MOUTH DAILY   NYSTATIN -TRIAMCINOLONE  OINTMENT (MYCOLOG)    Apply 1 Application topically as needed.   PHENAZOPYRIDINE  (PYRIDIUM ) 100 MG TABLET    Take 1 tablet (100 mg total) by mouth 3 (three) times daily as needed for up to 6 doses for pain.   ROSUVASTATIN  (CRESTOR ) 5 MG TABLET    TAKE 1 TABLET(5 MG) BY MOUTH DAILY  Modified Medications   No medications on file  Discontinued Medications   CEFDINIR  (OMNICEF ) 300 MG CAPSULE    Take 1 capsule (300 mg total) by mouth 2 (two) times daily.    Physical Exam:  Vitals:   11/01/23 1532  BP: 128/82  Pulse: (!) 54  Resp: (!) 9  Temp: (!) 97.4 F (36.3 C)  SpO2: 97%  Height: 5' 2 (1.575 m)   Body mass index is 32.92 kg/m. Wt Readings from Last 3 Encounters:  07/05/23 180 lb (81.6 kg)  01/04/23 184 lb 12.8 oz (83.8 kg)  10/26/22 189 lb (85.7 kg)    Physical Exam Constitutional:      General: She is not in acute distress.    Appearance: She is well-developed. She is not diaphoretic.  HENT:     Head: Normocephalic and atraumatic.     Mouth/Throat:     Pharynx: No oropharyngeal exudate.  Eyes:     Conjunctiva/sclera: Conjunctivae normal.     Pupils: Pupils are equal, round, and reactive to light.  Cardiovascular:     Rate and Rhythm: Normal rate and regular rhythm.     Heart sounds: Normal heart sounds.  Pulmonary:     Effort: Pulmonary effort is normal.     Breath sounds: Normal breath sounds.  Abdominal:     General: Bowel sounds are normal.     Palpations: Abdomen is soft.     Tenderness: There is no abdominal tenderness. There is no guarding.  Musculoskeletal:     Cervical back: Normal range of motion and neck supple.     Right lower leg: No edema.     Left lower leg: No edema.  Skin:    General: Skin is warm and dry.   Neurological:     Mental Status: She is alert.  Psychiatric:        Mood and Affect: Mood normal.     Labs reviewed: Basic Metabolic Panel: Recent Labs    01/04/23 1602 07/05/23 1546  NA 144 141  K 3.9 4.1  CL 107 107  CO2 27 28  GLUCOSE 128 96  BUN 19 20  CREATININE 0.72 0.75  CALCIUM  8.9 9.3  TSH  --  1.55   Liver Function Tests: Recent Labs    01/04/23 1602 07/05/23 1546  AST 17 18  ALT 11 10  BILITOT 0.7 0.8  PROT 6.2 6.4   No results for input(s): LIPASE, AMYLASE in the last 8760 hours. No results for input(s): AMMONIA in the last 8760 hours. CBC: Recent Labs    01/04/23 1602 07/05/23 1546  WBC 5.0 6.0  NEUTROABS 2,760 3,084  HGB 13.7 13.6  HCT 42.8 42.6  MCV 86.5 86.9  PLT 158 171   Lipid Panel: Recent Labs    01/04/23 1602  CHOL 123  HDL 52  LDLCALC 51  TRIG 122  CHOLHDL 2.4   TSH: Recent Labs    07/05/23 1546  TSH 1.55   A1C: Lab Results  Component Value Date   HGBA1C 5.8 (H) 07/05/2023     Assessment/Plan 1. Dysuria (Primary) Ongoing dysuria  Encouraged hydration Will await culture prior to antibiotics - POC Urinalysis Dipstick - Urine Culture - phenazopyridine  (PYRIDIUM ) 100 MG tablet; Take 1 tablet (100 mg total) by mouth 3 (three) times daily as needed for up to 6 doses for pain.  Dispense: 6 tablet; Refill: 0  2. Recurrent UTI - Ambulatory referral to Urology Previously on keflex  but on hold due to recent Uti and antibiotics    Tuwanda Vokes K. Caro BODILY [facility] [phone]

## 2023-11-04 ENCOUNTER — Ambulatory Visit: Payer: Self-pay | Admitting: Nurse Practitioner

## 2023-11-04 LAB — URINE CULTURE
MICRO NUMBER:: 16814251
SPECIMEN QUALITY:: ADEQUATE

## 2023-11-04 MED ORDER — AMOXICILLIN-POT CLAVULANATE 875-125 MG PO TABS: 1.0000 | ORAL_TABLET | Freq: Two times a day (BID) | ORAL | 0 refills | Status: DC

## 2023-11-09 NOTE — Telephone Encounter (Signed)
 Copied from CRM #[id number]. Topic: Clinical - Lab/Test Results >> Nov 05, 2023 12:26 PM Diannia H wrote: Reason for CRM: Patient called and wanted to know the results of her lab. She missed a call. I went over what the provider said and where to get her medicine from. She was okay with understanding. >> Nov 09, 2023  3:11 PM Diannia H wrote: Patient has already been contacted about labs and she is already taking her meds. Patient was returning a call.

## 2023-11-09 NOTE — Telephone Encounter (Signed)
 Noted

## 2023-11-26 ENCOUNTER — Other Ambulatory Visit: Payer: Self-pay | Admitting: Nurse Practitioner

## 2023-11-26 DIAGNOSIS — E782 Mixed hyperlipidemia: Secondary | ICD-10-CM

## 2023-12-20 ENCOUNTER — Ambulatory Visit: Admitting: Nurse Practitioner

## 2024-01-06 ENCOUNTER — Other Ambulatory Visit: Payer: Self-pay | Admitting: Nurse Practitioner

## 2024-01-06 DIAGNOSIS — I10 Essential (primary) hypertension: Secondary | ICD-10-CM

## 2024-01-17 ENCOUNTER — Ambulatory Visit: Admitting: Nurse Practitioner

## 2024-01-17 ENCOUNTER — Encounter: Payer: Self-pay | Admitting: Nurse Practitioner

## 2024-01-17 VITALS — BP 118/76 | HR 62 | Temp 98.1°F | Ht 62.0 in | Wt 179.4 lb

## 2024-01-17 DIAGNOSIS — T148XXA Other injury of unspecified body region, initial encounter: Secondary | ICD-10-CM

## 2024-01-17 DIAGNOSIS — M150 Primary generalized (osteo)arthritis: Secondary | ICD-10-CM | POA: Diagnosis not present

## 2024-01-17 DIAGNOSIS — N390 Urinary tract infection, site not specified: Secondary | ICD-10-CM

## 2024-01-17 DIAGNOSIS — Z23 Encounter for immunization: Secondary | ICD-10-CM | POA: Diagnosis not present

## 2024-01-17 DIAGNOSIS — Z9109 Other allergy status, other than to drugs and biological substances: Secondary | ICD-10-CM

## 2024-01-17 DIAGNOSIS — E782 Mixed hyperlipidemia: Secondary | ICD-10-CM | POA: Diagnosis not present

## 2024-01-17 DIAGNOSIS — I872 Venous insufficiency (chronic) (peripheral): Secondary | ICD-10-CM

## 2024-01-17 DIAGNOSIS — I10 Essential (primary) hypertension: Secondary | ICD-10-CM

## 2024-01-17 LAB — COMPREHENSIVE METABOLIC PANEL WITH GFR
AG Ratio: 1.6 (calc) (ref 1.0–2.5)
ALT: 9 U/L (ref 6–29)
AST: 18 U/L (ref 10–35)
Albumin: 3.7 g/dL (ref 3.6–5.1)
Alkaline phosphatase (APISO): 47 U/L (ref 37–153)
BUN: 20 mg/dL (ref 7–25)
CO2: 26 mmol/L (ref 20–32)
Calcium: 8.7 mg/dL (ref 8.6–10.4)
Chloride: 106 mmol/L (ref 98–110)
Creat: 0.7 mg/dL (ref 0.60–0.95)
Globulin: 2.3 g/dL (ref 1.9–3.7)
Glucose, Bld: 96 mg/dL (ref 65–99)
Potassium: 4.2 mmol/L (ref 3.5–5.3)
Sodium: 140 mmol/L (ref 135–146)
Total Bilirubin: 0.7 mg/dL (ref 0.2–1.2)
Total Protein: 6 g/dL — ABNORMAL LOW (ref 6.1–8.1)
eGFR: 84 mL/min/1.73m2 (ref >=60)

## 2024-01-17 LAB — CBC WITH DIFFERENTIAL/PLATELET
Absolute Lymphocytes: 2394 {cells}/uL (ref 850–3900)
Absolute Monocytes: 721 {cells}/uL (ref 200–950)
Basophils Absolute: 21 {cells}/uL (ref 0–200)
Basophils Relative: 0.3 %
Eosinophils Absolute: 168 {cells}/uL (ref 15–500)
Eosinophils Relative: 2.4 %
HCT: 43.2 % (ref 35.0–45.0)
Hemoglobin: 13.5 g/dL (ref 11.7–15.5)
MCH: 27.6 pg (ref 27.0–33.0)
MCHC: 31.3 g/dL — ABNORMAL LOW (ref 32.0–36.0)
MCV: 88.3 fL (ref 80.0–100.0)
MPV: 10.9 fL (ref 7.5–12.5)
Monocytes Relative: 10.3 %
Neutro Abs: 3696 {cells}/uL (ref 1500–7800)
Neutrophils Relative %: 52.8 %
Platelets: 151 Thousand/uL (ref 140–400)
RBC: 4.89 Million/uL (ref 3.80–5.10)
RDW: 14 % (ref 11.0–15.0)
Total Lymphocyte: 34.2 %
WBC: 7 Thousand/uL (ref 3.8–10.8)

## 2024-01-17 LAB — LIPID PANEL
Cholesterol: 115 mg/dL (ref <200)
HDL: 49 mg/dL — ABNORMAL LOW (ref >=50)
LDL Cholesterol (Calc): 44 mg/dL
Non-HDL Cholesterol (Calc): 66 mg/dL (ref <130)
Total CHOL/HDL Ratio: 2.3 (calc) (ref <5.0)
Triglycerides: 141 mg/dL (ref <150)

## 2024-01-17 MED ORDER — MUPIROCIN CALCIUM 2 % EX CREA: 1.0000 | TOPICAL_CREAM | Freq: Two times a day (BID) | CUTANEOUS | 0 refills | Status: AC

## 2024-01-17 NOTE — Progress Notes (Signed)
 Careteam: Patient Care Team: Susan Harlene POUR, NP as PCP - General (Geriatric Medicine) Helga Slice, MD as Attending Physician (Dermatology) Arelia Filippo, MD as Consulting Physician (Plastic Surgery) Rozell Norris, MD as Referring Physician (Urology) Early, Krystal FALCON, MD (Inactive) as Consulting Physician (Vascular Surgery) Melodi Lerner, MD as Consulting Physician (Orthopedic Surgery) Cleatus Collar, MD as Consulting Physician (Ophthalmology)  PLACE OF SERVICE:  [facility] CLINIC  Advanced Directive information    Allergies  Allergen Reactions   Azithromycin Other (See Comments)    thrush   Latex Hives and Other (See Comments)    Lip swelling and redness    Codeine Itching   Doxycycline     Unsure of reaction    Nsaids     Stomach ulcers    Tape Rash    Latex Bandaid     Chief Complaint  Patient presents with   Medical Management of Chronic Issues    6 month follow up : Wheezaing / hole in arm.  Pt agree on Flu shot     HPI:  Discussed the use of AI scribe software for clinical note transcription with the patient, who gave verbal consent to proceed.  History of Present Illness Susan Boone is an 89 year old female who presents for a routine follow-up.   She has been on prophylactic treatment for urinary tract infections and has experienced no symptoms since resuming the medication. She is uncertain if she made an appt for  [facility]. She had requested referral at last visit.    She experiences nocturnal wheezing, described as sounding like 'little tiny birds.' There is no associated shortness of breath, but she occasionally has a cough that feels like a 'speck of something' in her throat. She has a history of childhood asthma and has no current smoking history.   She reports a history of being told by a doctor that her symptoms were like Sjogren's syndrome, and describes dry mouth with her tongue sticking to the roof of her mouth, as well  as dry eyes with itching around the eyelids. She also reports a clear fluid runny nose when eating or engaging in activities.   She recently removed something from her arm, possibly a wart or mole, which has been scabbing over for a couple of weeks with some redness and inflammation. She has been applying cortisone and Polysporin to the area.   She is currently taking nadolol  40 mg daily for blood pressure, initially started for migraines, and Crestor  5 mg daily for cholesterol.    She reports her knees are 'bad' with no cartilage, and she experiences variable swelling in her legs. She also mentions having 'purple feet' and dry skin.   No chest pain, palpitations, or significant changes in bowel movements, though she notes a decreased appetite and less frequent bowel movements.        Review of Systems:  Review of Systems  Constitutional:  Negative for chills, fever and weight loss.  HENT:  Negative for tinnitus.   Respiratory:  Negative for cough, sputum production and shortness of breath.   Cardiovascular:  Negative for chest pain, palpitations and leg swelling.  Gastrointestinal:  Negative for abdominal pain, constipation, diarrhea and heartburn.  Genitourinary:  Negative for dysuria, frequency and urgency.  Musculoskeletal:  Negative for back pain, falls, joint pain and myalgias.  Skin: Negative.   Neurological:  Negative for dizziness and headaches.  Psychiatric/Behavioral:  Negative for depression and memory loss. The patient does not have  insomnia.     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 Almarie Morrow   Past Surgical History:  Procedure Laterality Date   ABLATION  2005   endovenous   APPENDECTOMY     AUGMENTATION MAMMAPLASTY Bilateral 1971   [facility] Dr. Santo   CHOLECYSTECTOMY  1986   chronic diarrhea since   COSMETIC SURGERY     LITHOTRIPSY Bilateral 2006   PERCUTANEOUS NEPHROSTOLITHOTOMY Left 06/30/2011   w/ cystoscopy by Dr. Fayette at [facility] (and poss ureteral stent placement?)   PERCUTANEOUS NEPHROSTOLITHOTOMY Left 07/07/2011   w/ ureteroscopy and stone manipulation Dr. Fayette at [facility]. Surg >4 hrs with gen anesthesia and pt noticed new permanent cognitive deficits after   TOTAL HIP ARTHROPLASTY Right 12/17/2020   Procedure: TOTAL HIP ARTHROPLASTY ANTERIOR APPROACH;  Surgeon: Ernie Cough, MD;  Location: [facility];  Service: Orthopedics;  Laterality: Right;   TUBAL LIGATION Bilateral 1968   URETEROLITHOTOMY Right 12/17/2011   Dr. Fayette at [facility]   URETEROSCOPY WITH HOLMIUM LASER LITHOTRIPSY Left 07/07/2011   by percutaneous nephrostolithotomy with basket extraction, ureteral stent removal (placed 06/30/11?), and placement of Lt percutaneous nephrostomy tube by Dr. Fayette at [facility] for numerous >2cm staghorn calcium  oxalate stones in left renal pelvis retained after initial perc nephrostolithotomy wiht stent placement 1 wk prior    Social History:   reports that she has never smoked. She has never used smokeless tobacco. She reports that she does not drink alcohol and does not use drugs.  Family History  Problem Relation Age of Onset   Stroke Father     Hyperlipidemia Father    Lung cancer Mother    Cancer Maternal Grandmother     Medications: Patient's Medications  New Prescriptions   MUPIROCIN CREAM (BACTROBAN) 2 %    Apply 1 Application topically 2 (two) times daily.  Previous Medications   ACETAMINOPHEN  (TYLENOL ) 500 MG TABLET    Take 500 mg by mouth as needed.   CEPHALEXIN  (KEFLEX ) 500 MG CAPSULE    TAKE 1 CAPSULE(500 MG) BY MOUTH DAILY FOR UTI PREVENTION   LINIMENTS (SALONPAS ARTHRITIS PAIN RELIEF EX)    Apply 1 patch topically daily as needed (pain).   NADOLOL  (CORGARD ) 40 MG TABLET    TAKE 1 TABLET(40 MG) BY MOUTH DAILY   NYSTATIN -TRIAMCINOLONE  OINTMENT (MYCOLOG)    Apply 1 Application topically as needed.   ROSUVASTATIN  (CRESTOR ) 5 MG TABLET    TAKE 1 TABLET(5 MG) BY MOUTH DAILY  Modified Medications   No medications on file  Discontinued Medications   AMOXICILLIN -CLAVULANATE (AUGMENTIN ) 875-125 MG TABLET    Take 1 tablet by mouth  2 (two) times daily.   PHENAZOPYRIDINE  (PYRIDIUM ) 100 MG TABLET    Take 1 tablet (100 mg total) by mouth 3 (three) times daily as needed for up to 6 doses for pain.    Physical Exam:  Vitals:   01/17/24 1503  BP: 118/76  Pulse: 62  Temp: 98.1 F (36.7 C)  SpO2: 96%  Weight: 179 lb 6.4 oz (81.4 kg)  Height: 5' 2 (1.575 m)   Body mass index is 32.81 kg/m. Wt Readings from Last 3 Encounters:  01/17/24 179 lb 6.4 oz (81.4 kg)  07/05/23 180 lb (81.6 kg)  01/04/23 184 lb 12.8 oz (83.8 kg)    Physical Exam Constitutional:      General: She is not in acute distress.    Appearance: She is well-developed. She is not diaphoretic.  HENT:     Head: Normocephalic and atraumatic.     Mouth/Throat:     Pharynx: No oropharyngeal exudate.  Eyes:     Conjunctiva/sclera: Conjunctivae normal.     Pupils: Pupils are equal, round, and reactive to light.  Cardiovascular:     Rate and Rhythm: Normal rate and regular rhythm.     Heart sounds: Normal heart sounds.  Pulmonary:     Effort: Pulmonary  effort is normal.     Breath sounds: Normal breath sounds.  Abdominal:     General: Bowel sounds are normal.     Palpations: Abdomen is soft.  Musculoskeletal:     Cervical back: Normal range of motion and neck supple.     Right lower leg: No edema.     Left lower leg: No edema.  Skin:    General: Skin is warm and dry.  Neurological:     Mental Status: She is alert.  Psychiatric:        Mood and Affect: Mood normal.    Labs reviewed: Basic Metabolic Panel: Recent Labs    07/05/23 1546  NA 141  K 4.1  CL 107  CO2 28  GLUCOSE 96  BUN 20  CREATININE 0.75  CALCIUM  9.3  TSH 1.55   Liver Function Tests: Recent Labs    07/05/23 1546  AST 18  ALT 10  BILITOT 0.8  PROT 6.4   No results for input(s): LIPASE, AMYLASE in the last 8760 hours. No results for input(s): AMMONIA in the last 8760 hours. CBC: Recent Labs    07/05/23 1546  WBC 6.0  NEUTROABS 3,084  HGB 13.6  HCT 42.6  MCV 86.9  PLT 171   Lipid Panel: No results for input(s): CHOL, HDL, LDLCALC, TRIG, CHOLHDL, LDLDIRECT in the last 8760 hours. TSH: Recent Labs    07/05/23 1546  TSH 1.55   A1C: Lab Results  Component Value Date   HGBA1C 5.8 (H) 07/05/2023     Assessment/Plan  Primary osteoarthritis involving multiple joints Assessment & Plan: Ongoing, continues on tylenol  PRN    Recurrent UTI Assessment & Plan: No recent UTI on keflex  She had expressed interest in following up with urologist and referral was made but she hasn't made appt at this time.    Venous insufficiency of both lower extremities Assessment & Plan: Ongoing and stable Noted to have chronic blue/purple lower extremities which are unchanged    Hyperlipidemia, mixed Assessment & Plan: Maintained on crestor  5 mg daily  Due for follow up lab  Orders: -     Lipid panel -     Lipid panel  Primary hypertension Assessment & Plan: Blood pressure well  controlled, goal bp <140/90 Continue current  medications and dietary modifications follow metabolic panel  Orders: -     CBC with Differential/Platelet -     Comprehensive metabolic panel with GFR  Abrasion -     Mupirocin Calcium ; Apply 1 Application topically 2 (two) times daily.  Dispense: 15 g; Refill: 0  Flu vaccine need -     Flu vaccine HIGH DOSE PF(Fluzone Trivalent)  Environmental allergies Assessment & Plan: To take Loratadine or cetrizine (generic for Claritin or zyrtec) 10 mg by mouth daily for allergies.       Return in about 6 months (around 07/17/2024) for routine follow up, labs at time of visit.:   Bemnet Trovato K. Susan BODILY [facility] [phone]

## 2024-01-17 NOTE — Patient Instructions (Addendum)
 [facility]- [facility] [phone] [place], [place] [address]. Phone: [phone] Fax [phone]  To take Loratadine or cetrizine (generic for Claritin or zyrtec) 10 mg by mouth daily for allergies.

## 2024-01-17 NOTE — Progress Notes (Signed)
 Careteam: Patient Care Team: Caro Harlene POUR, NP as PCP - General (Geriatric Medicine) Helga Slice, MD as Attending Physician (Dermatology) Arelia Filippo, MD as Consulting Physician (Plastic Surgery) Rozell Norris, MD as Referring Physician (Urology) Early, Krystal FALCON, MD (Inactive) as Consulting Physician (Vascular Surgery) Melodi Lerner, MD as Consulting Physician (Orthopedic Surgery) Cleatus Collar, MD as Consulting Physician (Ophthalmology)  PLACE OF SERVICE:  [facility] CLINIC  Advanced Directive information    Allergies  Allergen Reactions   Azithromycin Other (See Comments)    thrush   Latex Hives and Other (See Comments)    Lip swelling and redness    Codeine Itching   Doxycycline     Unsure of reaction    Nsaids     Stomach ulcers    Tape Rash    Latex Bandaid     Chief Complaint  Patient presents with   Medical Management of Chronic Issues    6 month follow up : Wheezaing / hole in arm.  Pt agree on Flu shot     HPI:  Discussed the use of AI scribe software for clinical note transcription with the patient, who gave verbal consent to proceed.  History of Present Illness Susan Boone is an 89 year old female who presents for a routine follow-up.  She has been on prophylactic treatment for urinary tract infections and has experienced no symptoms since resuming the medication. She is uncertain if she made an appt for  [facility]. She had requested referral at last visit.   She experiences nocturnal wheezing, described as sounding like 'little tiny birds.' There is no associated shortness of breath, but she occasionally has a cough that feels like a 'speck of something' in her throat. She has a history of childhood asthma and has no current smoking history.  She reports a history of being told by a doctor that her symptoms were like Sjogren's syndrome, and describes dry mouth with her tongue sticking to the roof of her mouth, as well as  dry eyes with itching around the eyelids. She also reports a clear fluid runny nose when eating or engaging in activities.  She recently removed something from her arm, possibly a wart or mole, which has been scabbing over for a couple of weeks with some redness and inflammation. She has been applying cortisone and Polysporin to the area.  She is currently taking nadolol  40 mg daily for blood pressure, initially started for migraines  Crestor  5 mg daily for cholesterol.   She reports her knees are 'bad' with no cartilage, and she experiences variable swelling in her legs. She has chronic 'purple feet' and dry skin due to venous insufficiency   No chest pain, palpitations, or significant changes in bowel movements, though she notes a decreased appetite and less frequent bowel movements. Has lost ~ 5 lbs in a year    Review of Systems:  Review of Systems  Constitutional:  Negative for chills, fever and weight loss.  HENT:  Negative for tinnitus.   Respiratory:  Negative for cough, sputum production and shortness of breath.   Cardiovascular:  Negative for chest pain, palpitations and leg swelling.  Gastrointestinal:  Negative for abdominal pain, constipation, diarrhea and heartburn.  Genitourinary:  Negative for dysuria, frequency and urgency.  Musculoskeletal:  Positive for joint pain. Negative for back pain, falls and myalgias.  Skin: Negative.   Neurological:  Negative for dizziness and headaches.  Psychiatric/Behavioral:  Negative for depression and memory loss. The patient  does not have insomnia.     Past Medical History:  Diagnosis Date   Allergy    environmental   Anemia    Arthritis    Asthma    Chronic diarrhea 1986   since cholecystecomy   Chronic venous insufficiency of lower extremity    Complication of anesthesia    Hard to wake up after kidney stone surgery   Esophageal reflux    H/O orthostatic hypotension    History of kidney stones    Kidney stones 2000    B  severe staghorn calcium  oxalate dihydrate stones - numerous stones >2 cm in Left renal pelvis - unable to even remove all of them during >3.5hr surgery that was repeat surg 1 week after initial w/ left percutaneous nephrostolithotomy inc laser lithotripsy basket extraction, ureteral stent removal, plcmnt of Lt perc nephrostomy tube 07/07/11 at [facility]   Migraine    Urge incontinence of urine    followed by Urology - Dr. Almarie Morrow   Past Surgical History:  Procedure Laterality Date   ABLATION  2005   endovenous   APPENDECTOMY     AUGMENTATION MAMMAPLASTY Bilateral 1971   [facility] Dr. Santo   CHOLECYSTECTOMY  1986   chronic diarrhea since   COSMETIC SURGERY     LITHOTRIPSY Bilateral 2006   PERCUTANEOUS NEPHROSTOLITHOTOMY Left 06/30/2011   w/ cystoscopy by Dr. Fayette at [facility] (and poss ureteral stent placement?)   PERCUTANEOUS NEPHROSTOLITHOTOMY Left 07/07/2011   w/ ureteroscopy and stone manipulation Dr. Fayette at [facility]. Surg >4 hrs with gen anesthesia and pt noticed new permanent cognitive deficits after   TOTAL HIP ARTHROPLASTY Right 12/17/2020   Procedure: TOTAL HIP ARTHROPLASTY ANTERIOR APPROACH;  Surgeon: Ernie Cough, MD;  Location: [facility];  Service: Orthopedics;  Laterality: Right;   TUBAL LIGATION Bilateral 1968   URETEROLITHOTOMY Right 12/17/2011   Dr. Fayette at [facility]   URETEROSCOPY WITH HOLMIUM LASER LITHOTRIPSY Left 07/07/2011   by percutaneous nephrostolithotomy with basket extraction, ureteral stent removal (placed 06/30/11?), and placement of Lt percutaneous nephrostomy tube by Dr. Fayette at [facility] for numerous >2cm staghorn calcium  oxalate stones in left renal pelvis retained after initial perc nephrostolithotomy wiht stent placement 1 wk prior    Social History:   reports that she has never smoked. She has never used smokeless tobacco. She reports that she does not drink alcohol and does not use drugs.  Family History  Problem  Relation Age of Onset   Stroke Father    Hyperlipidemia Father    Lung cancer Mother    Cancer Maternal Grandmother     Medications: Patient's Medications  New Prescriptions   No medications on file  Previous Medications   ACETAMINOPHEN  (TYLENOL ) 500 MG TABLET    Take 500 mg by mouth as needed.   CEPHALEXIN  (KEFLEX ) 500 MG CAPSULE    TAKE 1 CAPSULE(500 MG) BY MOUTH DAILY FOR UTI PREVENTION   LINIMENTS (SALONPAS ARTHRITIS PAIN RELIEF EX)    Apply 1 patch topically daily as needed (pain).   NADOLOL  (CORGARD ) 40 MG TABLET    TAKE 1 TABLET(40 MG) BY MOUTH DAILY   NYSTATIN -TRIAMCINOLONE  OINTMENT (MYCOLOG)    Apply 1 Application topically as needed.   ROSUVASTATIN  (CRESTOR ) 5 MG TABLET    TAKE 1 TABLET(5 MG) BY MOUTH DAILY  Modified Medications   No medications on file  Discontinued Medications   AMOXICILLIN -CLAVULANATE (AUGMENTIN ) 875-125 MG TABLET    Take 1 tablet by mouth 2 (two) times daily.   PHENAZOPYRIDINE  (PYRIDIUM ) 100  MG TABLET    Take 1 tablet (100 mg total) by mouth 3 (three) times daily as needed for up to 6 doses for pain.    Physical Exam:  Vitals:   01/17/24 1503  BP: 118/76  Pulse: 62  Temp: 98.1 F (36.7 C)  SpO2: 96%  Weight: 179 lb 6.4 oz (81.4 kg)  Height: 5' 2 (1.575 m)   Body mass index is 32.81 kg/m. Wt Readings from Last 3 Encounters:  01/17/24 179 lb 6.4 oz (81.4 kg)  07/05/23 180 lb (81.6 kg)  01/04/23 184 lb 12.8 oz (83.8 kg)    Physical Exam Constitutional:      General: She is not in acute distress.    Appearance: She is well-developed. She is not diaphoretic.  HENT:     Head: Normocephalic and atraumatic.     Mouth/Throat:     Pharynx: No oropharyngeal exudate.  Eyes:     Conjunctiva/sclera: Conjunctivae normal.     Pupils: Pupils are equal, round, and reactive to light.  Cardiovascular:     Rate and Rhythm: Normal rate and regular rhythm.     Heart sounds: Normal heart sounds.  Pulmonary:     Effort: Pulmonary effort is normal.      Breath sounds: Normal breath sounds.  Abdominal:     General: Bowel sounds are normal.     Palpations: Abdomen is soft.  Musculoskeletal:     Cervical back: Normal range of motion and neck supple.     Right lower leg: No edema.     Left lower leg: No edema.  Skin:    General: Skin is warm and dry.     Comments: Bilateral LE purple/blue in color  Neurological:     Mental Status: She is alert and oriented to person, place, and time.     Motor: Weakness present.     Gait: Gait abnormal.  Psychiatric:        Mood and Affect: Mood normal.     Labs reviewed: Basic Metabolic Panel: Recent Labs    07/05/23 1546  NA 141  K 4.1  CL 107  CO2 28  GLUCOSE 96  BUN 20  CREATININE 0.75  CALCIUM  9.3  TSH 1.55   Liver Function Tests: Recent Labs    07/05/23 1546  AST 18  ALT 10  BILITOT 0.8  PROT 6.4   No results for input(s): LIPASE, AMYLASE in the last 8760 hours. No results for input(s): AMMONIA in the last 8760 hours. CBC: Recent Labs    07/05/23 1546  WBC 6.0  NEUTROABS 3,084  HGB 13.6  HCT 42.6  MCV 86.9  PLT 171   Lipid Panel: No results for input(s): CHOL, HDL, LDLCALC, TRIG, CHOLHDL, LDLDIRECT in the last 8760 hours. TSH: Recent Labs    07/05/23 1546  TSH 1.55   A1C: Lab Results  Component Value Date   HGBA1C 5.8 (H) 07/05/2023     Assessment/Plan  Primary osteoarthritis involving multiple joints Assessment & Plan: Ongoing, continues on tylenol  PRN    Recurrent UTI Assessment & Plan: No recent UTI on keflex  She had expressed interest in following up with urologist and referral was made but she hasn't made appt at this time.    Venous insufficiency of both lower extremities Assessment & Plan: Ongoing and stable Noted to have chronic blue/purple lower extremities which are unchanged    Hyperlipidemia, mixed Assessment & Plan: Maintained on crestor  5 mg daily  Due for follow up lab  Orders: -  Lipid  panel  Primary hypertension Assessment & Plan: Blood pressure well controlled, goal bp <140/90 Continue current medications and dietary modifications follow metabolic panel  Orders: -     CBC with Differential/Platelet -     Comprehensive metabolic panel with GFR  Abrasion -     Mupirocin Calcium ; Apply 1 Application topically 2 (two) times daily.  Dispense: 15 g; Refill: 0  Flu vaccine need -     Flu vaccine HIGH DOSE PF(Fluzone Trivalent)  Environmental allergies Assessment & Plan: To take Loratadine or cetrizine (generic for Claritin or zyrtec) 10 mg by mouth daily for allergies.    Other orders -     Lipid panel     Return in about 6 months (around 07/17/2024) for routine follow up, labs at time of visit.  Paiden Caraveo K. Caro BODILY [facility] [phone]

## 2024-01-18 ENCOUNTER — Ambulatory Visit: Payer: Self-pay | Admitting: Nurse Practitioner

## 2024-01-22 NOTE — Assessment & Plan Note (Signed)
To take Loratadine or cetrizine (generic for Claritin or zyrtec) 10 mg by mouth daily for allergies.  

## 2024-01-22 NOTE — Assessment & Plan Note (Signed)
 Blood pressure well controlled, goal bp <140/90 Continue current medications and dietary modifications follow metabolic panel

## 2024-01-22 NOTE — Assessment & Plan Note (Signed)
 Ongoing and stable Noted to have chronic blue/purple lower extremities which are unchanged

## 2024-01-22 NOTE — Assessment & Plan Note (Signed)
 Ongoing, continues on tylenol  PRN

## 2024-01-22 NOTE — Assessment & Plan Note (Signed)
 No recent UTI on keflex  She had expressed interest in following up with urologist and referral was made but she hasn't made appt at this time.

## 2024-01-22 NOTE — Assessment & Plan Note (Signed)
 Maintained on crestor  5 mg daily  Due for follow up lab

## 2024-02-02 ENCOUNTER — Ambulatory Visit (INDEPENDENT_AMBULATORY_CARE_PROVIDER_SITE_OTHER): Admitting: Nurse Practitioner

## 2024-02-02 ENCOUNTER — Encounter: Payer: Self-pay | Admitting: Nurse Practitioner

## 2024-02-02 VITALS — BP 126/82 | HR 64 | Temp 97.8°F | Ht 62.0 in | Wt 179.8 lb

## 2024-02-02 DIAGNOSIS — Z0000 Encounter for general adult medical examination without abnormal findings: Secondary | ICD-10-CM | POA: Diagnosis not present

## 2024-02-02 NOTE — Patient Instructions (Signed)
 Ms. Juneau,  Thank you for taking the time for your Medicare Wellness Visit. I appreciate your continued commitment to your health goals. Please review the care plan we discussed, and feel free to reach out if I can assist you further.  Please note that Annual Wellness Visits do not include a physical exam. Some assessments may be limited, especially if the visit was conducted virtually. If needed, we may recommend an in-person follow-up with your provider.  Ongoing Care Seeing your primary care provider every 3 to 6 months helps us  monitor your health and provide consistent, personalized care.   Referrals If a referral was made during today's visit and you haven't received any updates within two weeks, please contact the referred provider directly to check on the status.  Recommended Screenings:  Health Maintenance  Topic Date Due   Medicare Annual Wellness Visit  10/26/2023   COVID-19 Vaccine (6 - 2025-26 season) 11/22/2023   DTaP/Tdap/Td vaccine (2 - Td or Tdap) 03/23/2024*   Zoster (Shingles) Vaccine (1 of 2) 03/23/2025*   Pneumococcal Vaccine for age over 66  Completed   Flu Shot  Completed   DEXA scan (bone density measurement)  Completed   Meningitis B Vaccine  Aged Out  *Topic was postponed. The date shown is not the original due date.       02/02/2024    2:29 PM  Advanced Directives  Does Patient Have a Medical Advance Directive? Yes  Type of Estate Agent of [place];Out of facility DNR (pink MOST or yellow form)  Does patient want to make changes to medical advance directive? No - Patient declined  Copy of Healthcare Power of Attorney in Chart? Yes - validated most recent copy scanned in chart (See row information)    Vision: Annual vision screenings are recommended for early detection of glaucoma, cataracts, and diabetic retinopathy. These exams can also reveal signs of chronic conditions such as diabetes and high blood pressure.  Dental: Annual  dental screenings help detect early signs of oral cancer, gum disease, and other conditions linked to overall health, including heart disease and diabetes.  Please see the attached documents for additional preventive care recommendations.

## 2024-02-02 NOTE — Progress Notes (Signed)
 Chief Complaint  Patient presents with   Annual wellness     Subjective:   Susan Boone is a 89 y.o. female who presents for a Medicare Annual Wellness Visit.  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 Almarie Morrow   Past Surgical History:  Procedure Laterality Date   ABLATION  2005   endovenous   APPENDECTOMY     AUGMENTATION MAMMAPLASTY Bilateral 1971   [facility] Dr. Santo   CHOLECYSTECTOMY  1986   chronic diarrhea since   COSMETIC SURGERY     LITHOTRIPSY Bilateral 2006   PERCUTANEOUS NEPHROSTOLITHOTOMY Left 06/30/2011   w/ cystoscopy by Dr. Fayette at [facility] (and poss ureteral stent placement?)   PERCUTANEOUS NEPHROSTOLITHOTOMY Left 07/07/2011   w/ ureteroscopy and stone manipulation Dr. Fayette at [facility]. Surg >4 hrs with gen anesthesia and pt noticed new permanent cognitive deficits after   TOTAL HIP ARTHROPLASTY Right 12/17/2020   Procedure: TOTAL HIP ARTHROPLASTY ANTERIOR APPROACH;  Surgeon: Ernie Cough, MD;  Location: [facility];  Service: Orthopedics;  Laterality: Right;   TUBAL LIGATION Bilateral 1968    URETEROLITHOTOMY Right 12/17/2011   Dr. Fayette at [facility]   URETEROSCOPY WITH HOLMIUM LASER LITHOTRIPSY Left 07/07/2011   by percutaneous nephrostolithotomy with basket extraction, ureteral stent removal (placed 06/30/11?), and placement of Lt percutaneous nephrostomy tube by Dr. Fayette at [facility] for numerous >2cm staghorn calcium  oxalate stones in left renal pelvis retained after initial perc nephrostolithotomy wiht stent placement 1 wk prior    Family History  Problem Relation Age of Onset   Stroke Father    Hyperlipidemia Father    Lung cancer Mother    Cancer Maternal Grandmother    Social History   Occupational History   Occupation: retired  Tobacco Use   Smoking status: Never   Smokeless tobacco: Never   Tobacco comments:    Smoked a little bit, never a habit to break (no struggle)   Vaping Use   Vaping status: Never Used  Substance and Sexual Activity   Alcohol use: No    Alcohol/week: 0.0 standard drinks of alcohol   Drug use: No   Sexual activity: Not on file   Tobacco Counseling Counseling given: Not Answered Tobacco comments: Smoked a little bit, never a habit to break (no struggle)   SDOH Screenings   Depression (PHQ2-9): Low Risk  (02/02/2024)  Physical Activity: Inactive (02/02/2024)  Social Connections: Socially Isolated (02/02/2024)  Stress: No Stress Concern Present (02/02/2024)  Tobacco Use: Low Risk  (02/02/2024)   See flowsheets for full screening details  Depression Screen PHQ 2 & 9 Depression Scale- Over the past 2 weeks, how often have you been bothered by any of the following problems? Little interest or pleasure in doing things: 0 Feeling down, depressed, or hopeless (PHQ Adolescent also includes...irritable): 0 PHQ-2 Total Score: 0     Goals Addressed   None    Visit info / Clinical Intake: Medicare Wellness Visit Type:: Subsequent Annual Wellness Visit Persons participating in visit:: patient Medicare Wellness Visit  Mode:: In-person (required for WTM) Information given by:: patient Interpreter Needed?: No Pre-visit prep was completed: no AWV questionnaire completed by patient prior to visit?: no Living arrangements:: lives with spouse/significant other Patient's Overall Health Status Rating: good Typical amount of pain: some Does pain affect daily life?: (!) yes (neuropathy doesn't affect pain but knees do) Are you currently prescribed opioids?: no  Dietary Habits and Nutritional Risks How many meals a day?: 2 (Patient has yogurt in the morning frutis and veggies through the day and broccli at night time.) Eats fruit and vegetables daily?: yes Most meals are obtained by: eating out; preparing own meals (Husband gets food from hot bar sometimes at [employer] along with making meals at home) In the last 2 weeks, have you had any of the following?: none Diabetic:: no  Functional Status Activities of Daily Living (to include ambulation/medication): Independent Ambulation: Independent with device- listed below Home Assistive Devices/Equipment: Walker (specify Type) Medication Administration: Independent Home Management: Independent Manage your own finances?: yes Primary transportation is: family/friends Concerns about vision?: (!) yes Concerns about hearing?: no  Fall Screening Falls in the past year?: 0 Number of falls in past year: 0 Was there an injury with Fall?: 0 Fall Risk Category Calculator: 0 Patient Fall Risk Level: Low Fall Risk  Fall Risk Patient at Risk for Falls Due to: No Fall Risks Fall risk Follow up: Falls evaluation completed  Home and Transportation Safety: All rugs have non-skid backing?: yes All stairs or steps have railings?: yes Grab bars in the bathtub or shower?: yes Have non-skid surface in bathtub or shower?: yes Good home lighting?: yes Regular seat belt use?: yes Hospital stays in the last year:: no  Cognitive Assessment Difficulty concentrating,  remembering, or making decisions? : yes Will 6CIT or Mini Cog be Completed: yes What year is it?: 0 points (2025) What month is it?: 0 points (NOV) Give patient an address phrase to remember (5 components): [address] [place] About what time is it?: 0 points Count backwards from 20 to 1: 0 points ([id number]) Say the months of the year in reverse: 0 points (Dec,Nov,Oct,Sept,Aug,July,June,May,April,March,Feb,Jan) Repeat the address phrase from earlier: 2 points ([address]) 6 CIT Score: 2 points  Advance Directives (For Healthcare) Does Patient Have a Medical Advance Directive?: Yes Does patient want to make changes to medical advance directive?: No - Patient declined Type of Advance Directive: Healthcare Power of [place]; Out of facility DNR (pink MOST or yellow form) Copy of Healthcare Power of Attorney in Chart?: Yes - validated most recent copy scanned in chart (See row information) Out of facility DNR (pink MOST or yellow form) in Chart? (Ambulatory ONLY): Yes - validated most recent copy scanned in chart  Reviewed/Updated  Reviewed/Updated: Reviewed All (Medical, Surgical, Family, Medications, Allergies, Care Teams, Patient Goals)        Objective:    Today's Vitals   02/02/24 1442  BP: 126/82  Pulse: 64  Temp: 97.8 F (36.6 C)  SpO2: 98%  Weight: 179  lb 12.8 oz (81.6 kg)  Height: 5' 2 (1.575 m)   Body mass index is 32.89 kg/m.  Current Medications (verified) Outpatient Encounter Medications as of 02/02/2024  Medication Sig   acetaminophen  (TYLENOL ) 500 MG tablet Take 500 mg by mouth as needed.   cephALEXin  (KEFLEX ) 500 MG capsule TAKE 1 CAPSULE(500 MG) BY MOUTH DAILY FOR UTI PREVENTION   Liniments (SALONPAS ARTHRITIS PAIN RELIEF EX) Apply 1 patch topically daily as needed (pain).   mupirocin cream (BACTROBAN) 2 % Apply 1 Application topically 2 (two) times daily.   nadolol  (CORGARD ) 40 MG tablet TAKE 1  TABLET(40 MG) BY MOUTH DAILY   nystatin -triamcinolone  ointment (MYCOLOG) Apply 1 Application topically as needed.   rosuvastatin  (CRESTOR ) 5 MG tablet TAKE 1 TABLET(5 MG) BY MOUTH DAILY   No facility-administered encounter medications on file as of 02/02/2024.   Hearing/Vision screen Hearing Screening - Comments:: Patient stated hearing has been pretty good.  Vision Screening - Comments:: Last eye exam : earlier in 2025 year  had cataract appt Immunizations and Health Maintenance Health Maintenance  Topic Date Due   COVID-19 Vaccine (6 - 2025-26 season) 11/22/2023   DTaP/Tdap/Td (2 - Td or Tdap) 03/23/2024 (Originally 02/24/2023)   Zoster Vaccines- Shingrix (1 of 2) 03/23/2025 (Originally 09/09/1953)   Medicare Annual Wellness (AWV)  02/01/2025   Pneumococcal Vaccine: 50+ Years  Completed   Influenza Vaccine  Completed   DEXA SCAN  Completed   Meningococcal B Vaccine  Aged Out        Assessment/Plan:  This is a routine wellness examination for [facility].  Patient Care Team: Caro Harlene POUR, NP as PCP - General (Geriatric Medicine) Helga Slice, MD as Attending Physician (Dermatology) Arelia Filippo, MD as Consulting Physician (Plastic Surgery) Rozell Norris, MD as Referring Physician (Urology) Early, Krystal FALCON, MD (Inactive) as Consulting Physician (Vascular Surgery) Melodi Lerner, MD as Consulting Physician (Orthopedic Surgery) Cleatus Collar, MD as Consulting Physician (Ophthalmology)  I have personally reviewed and noted the following in the patient's chart:   Medical and social history Use of alcohol, tobacco or illicit drugs  Current medications and supplements including opioid prescriptions. Functional ability and status Nutritional status Physical activity Advanced directives List of other physicians Hospitalizations, surgeries, and ER visits in previous 12 months Vitals Screenings to include cognitive, depression, and falls Referrals and  appointments  No orders of the defined types were placed in this encounter.  In addition, I have reviewed and discussed with patient certain preventive protocols, quality metrics, and best practice recommendations. A written personalized care plan for preventive services as well as general preventive health recommendations were provided to patient.   Harlene POUR Caro, NP   02/02/2024   Return in 1 year (on 02/01/2025).  After Visit Summary: (In Person-Printed) AVS printed and given to the patient

## 2024-07-17 ENCOUNTER — Ambulatory Visit: Admitting: Nurse Practitioner

## 2025-02-05 ENCOUNTER — Ambulatory Visit: Admitting: Nurse Practitioner
